# Patient Record
Sex: Male | Born: 1965 | Hispanic: No | Marital: Married | Smoking: Former smoker
Health system: Southern US, Community
[De-identification: ages and names within clinical notes are randomized; demographics above are authoritative.]

## PROBLEM LIST (undated history)

## (undated) DIAGNOSIS — S2239XA Fracture of one rib, unspecified side, initial encounter for closed fracture: Secondary | ICD-10-CM

## (undated) DIAGNOSIS — K3184 Gastroparesis: Secondary | ICD-10-CM

## (undated) DIAGNOSIS — S0300XA Dislocation of jaw, unspecified side, initial encounter: Secondary | ICD-10-CM

## (undated) DIAGNOSIS — E785 Hyperlipidemia, unspecified: Secondary | ICD-10-CM

## (undated) DIAGNOSIS — F17200 Nicotine dependence, unspecified, uncomplicated: Secondary | ICD-10-CM

## (undated) DIAGNOSIS — N319 Neuromuscular dysfunction of bladder, unspecified: Secondary | ICD-10-CM

## (undated) DIAGNOSIS — J309 Allergic rhinitis, unspecified: Secondary | ICD-10-CM

## (undated) DIAGNOSIS — Z9889 Other specified postprocedural states: Secondary | ICD-10-CM

## (undated) DIAGNOSIS — G35 Multiple sclerosis: Secondary | ICD-10-CM

## (undated) DIAGNOSIS — R112 Nausea with vomiting, unspecified: Secondary | ICD-10-CM

## (undated) DIAGNOSIS — R269 Unspecified abnormalities of gait and mobility: Secondary | ICD-10-CM

## (undated) DIAGNOSIS — K219 Gastro-esophageal reflux disease without esophagitis: Secondary | ICD-10-CM

## (undated) DIAGNOSIS — G4700 Insomnia, unspecified: Secondary | ICD-10-CM

## (undated) DIAGNOSIS — G629 Polyneuropathy, unspecified: Secondary | ICD-10-CM

## (undated) DIAGNOSIS — Z86010 Personal history of colonic polyps: Principal | ICD-10-CM

## (undated) DIAGNOSIS — J90 Pleural effusion, not elsewhere classified: Secondary | ICD-10-CM

## (undated) HISTORY — DX: Insomnia, unspecified: G47.00

## (undated) HISTORY — PX: ANKLE SURGERY: SHX546

## (undated) HISTORY — DX: Neuromuscular dysfunction of bladder, unspecified: N31.9

## (undated) HISTORY — DX: Unspecified abnormalities of gait and mobility: R26.9

## (undated) HISTORY — DX: Multiple sclerosis: G35

## (undated) HISTORY — DX: Hyperlipidemia, unspecified: E78.5

## (undated) HISTORY — DX: Personal history of colonic polyps: Z86.010

## (undated) HISTORY — PX: FOOT SURGERY: SHX648

## (undated) HISTORY — DX: Gastro-esophageal reflux disease without esophagitis: K21.9

## (undated) HISTORY — DX: Dislocation of jaw, unspecified side, initial encounter: S03.00XA

## (undated) HISTORY — DX: Allergic rhinitis, unspecified: J30.9

## (undated) HISTORY — DX: Nicotine dependence, unspecified, uncomplicated: F17.200

## (undated) HISTORY — PX: COLONOSCOPY: SHX174

---

## 2003-06-21 ENCOUNTER — Encounter: Admission: RE | Admit: 2003-06-21 | Discharge: 2003-06-21 | Payer: Self-pay | Admitting: Neurology

## 2003-06-21 ENCOUNTER — Encounter: Payer: Self-pay | Admitting: Neurology

## 2003-07-12 ENCOUNTER — Ambulatory Visit (HOSPITAL_COMMUNITY): Admission: RE | Admit: 2003-07-12 | Discharge: 2003-07-12 | Payer: Self-pay | Admitting: Neurology

## 2003-07-12 ENCOUNTER — Encounter: Payer: Self-pay | Admitting: Neurology

## 2003-08-08 ENCOUNTER — Encounter (INDEPENDENT_AMBULATORY_CARE_PROVIDER_SITE_OTHER): Payer: Self-pay | Admitting: *Deleted

## 2003-08-08 ENCOUNTER — Ambulatory Visit (HOSPITAL_COMMUNITY): Admission: RE | Admit: 2003-08-08 | Discharge: 2003-08-08 | Payer: Self-pay | Admitting: Neurology

## 2004-03-06 ENCOUNTER — Ambulatory Visit (HOSPITAL_COMMUNITY): Admission: RE | Admit: 2004-03-06 | Discharge: 2004-03-06 | Payer: Self-pay | Admitting: Neurology

## 2006-05-21 ENCOUNTER — Ambulatory Visit: Payer: Self-pay | Admitting: Psychology

## 2006-05-21 ENCOUNTER — Encounter: Admission: RE | Admit: 2006-05-21 | Discharge: 2006-08-19 | Payer: Self-pay | Admitting: Psychology

## 2006-07-30 ENCOUNTER — Ambulatory Visit: Payer: Self-pay | Admitting: Psychology

## 2006-08-22 ENCOUNTER — Ambulatory Visit: Payer: Self-pay | Admitting: Psychology

## 2006-09-18 ENCOUNTER — Ambulatory Visit: Payer: Self-pay | Admitting: Psychology

## 2006-10-02 ENCOUNTER — Ambulatory Visit: Payer: Self-pay | Admitting: Psychology

## 2006-10-16 ENCOUNTER — Ambulatory Visit: Payer: Self-pay | Admitting: Psychology

## 2006-11-13 ENCOUNTER — Ambulatory Visit: Payer: Self-pay | Admitting: Psychology

## 2006-12-11 ENCOUNTER — Ambulatory Visit: Payer: Self-pay | Admitting: Psychology

## 2008-11-08 ENCOUNTER — Encounter: Admission: RE | Admit: 2008-11-08 | Discharge: 2008-11-08 | Payer: Self-pay | Admitting: Neurology

## 2008-11-08 ENCOUNTER — Ambulatory Visit: Payer: Self-pay | Admitting: Psychology

## 2010-03-27 ENCOUNTER — Ambulatory Visit: Payer: Self-pay | Admitting: Internal Medicine

## 2010-03-27 DIAGNOSIS — J309 Allergic rhinitis, unspecified: Secondary | ICD-10-CM | POA: Insufficient documentation

## 2010-03-27 DIAGNOSIS — K219 Gastro-esophageal reflux disease without esophagitis: Secondary | ICD-10-CM

## 2010-03-27 DIAGNOSIS — E785 Hyperlipidemia, unspecified: Secondary | ICD-10-CM | POA: Insufficient documentation

## 2010-03-27 DIAGNOSIS — G35 Multiple sclerosis: Secondary | ICD-10-CM | POA: Insufficient documentation

## 2010-03-27 HISTORY — DX: Gastro-esophageal reflux disease without esophagitis: K21.9

## 2010-03-27 HISTORY — DX: Allergic rhinitis, unspecified: J30.9

## 2010-03-27 HISTORY — DX: Hyperlipidemia, unspecified: E78.5

## 2010-03-27 HISTORY — DX: Multiple sclerosis: G35

## 2010-03-27 LAB — CONVERTED CEMR LAB
AST: 31 units/L (ref 0–37)
BUN: 20 mg/dL (ref 6–23)
Basophils Absolute: 0 10*3/uL (ref 0.0–0.1)
Calcium: 9.4 mg/dL (ref 8.4–10.5)
Cholesterol: 199 mg/dL (ref 0–200)
Eosinophils Absolute: 0.1 10*3/uL (ref 0.0–0.7)
GFR calc non Af Amer: 63.61 mL/min (ref >60)
Glucose, Bld: 96 mg/dL (ref 70–99)
HCT: 44.7 % (ref 39.0–52.0)
Lymphocytes Relative: 19.4 % (ref 12.0–46.0)
Lymphs Abs: 1.3 10*3/uL (ref 0.7–4.0)
MCHC: 33.4 g/dL (ref 30.0–36.0)
Monocytes Relative: 11 % (ref 3.0–12.0)
Platelets: 167 10*3/uL (ref 150.0–400.0)
RDW: 14.1 % (ref 11.5–14.6)
TSH: 1.14 microintl units/mL (ref 0.35–5.50)
Total Bilirubin: 0.9 mg/dL (ref 0.3–1.2)

## 2010-04-04 ENCOUNTER — Telehealth: Payer: Self-pay | Admitting: Internal Medicine

## 2010-04-25 ENCOUNTER — Encounter: Admission: RE | Admit: 2010-04-25 | Discharge: 2010-04-25 | Payer: Self-pay | Admitting: Neurology

## 2010-05-24 ENCOUNTER — Ambulatory Visit: Payer: Self-pay | Admitting: Internal Medicine

## 2010-05-24 DIAGNOSIS — F17200 Nicotine dependence, unspecified, uncomplicated: Secondary | ICD-10-CM

## 2010-05-24 HISTORY — DX: Nicotine dependence, unspecified, uncomplicated: F17.200

## 2010-07-25 ENCOUNTER — Telehealth: Payer: Self-pay | Admitting: Internal Medicine

## 2010-09-10 ENCOUNTER — Encounter: Payer: Self-pay | Admitting: Internal Medicine

## 2010-11-13 ENCOUNTER — Encounter: Payer: Self-pay | Admitting: Internal Medicine

## 2011-01-08 NOTE — Letter (Signed)
Summary: [facility]  [facility]   Imported By: Maryln Gottron 09/14/2010 14:04:32  _____________________________________________________________________  External Attachment:    Type:   Image     Comment:   External Document

## 2011-01-08 NOTE — Assessment & Plan Note (Signed)
Summary: BRAND NEW PT/TO EST/CJR   Vital Signs:  Patient profile:   45 year old male Height:      70.75 inches (179.71 cm) Weight:      172 pounds (78.18 kg) BMI:     24.25 Temp:     97.3 degrees F (36.28 degrees C) oral BP sitting:   110 / 70  (left arm) Cuff size:   regular  Vitals Entered By: Duard Brady LPN (March 27, 2010 2:49 PM) CC: new pt to establish Is Patient Diabetic? No   CC:  new pt to establish.  History of Present Illness: 45 year old patient who is seen today to establish with our practice.  He has a history of MS and is followed by Dr. love.  His prior physician has retired.  He has a history of gastroesophageal reflux disease, chronic insomnia, peripheral neuropathy, and also a history of ongoing tobacco use.  He has a history of secondary neurogenic bladder as well.  Sounds like she has had some significant weight gain recently related to lyrica, but now is back to baseline since this has been discontinued.  Preventive Screening-Counseling & Management  Alcohol-Tobacco     Smoking Status: current     Smoking Cessation Counseling: yes  Caffeine-Diet-Exercise     Does Patient Exercise: no  Allergies (verified): 1)  ! Morphine 2)  ! Amoxicillin 3)  ! Ampicillin  Past History:  Past Medical History: MS/cognitive impairment GERD Hyperlipidemia  peripheral neuropathy neurogenic bladder tobacco abuse history of TMJ chronic insomnia Allergic rhinitis  Past Surgical History: broken ankle and heel 2009 colonoscopy 2007 Madilyn Fireman)  Family History: Reviewed history and no changes required.  mother is living and in reasonably good health father died age 2.  Lung cancer one brother one sister are well  Social History: Reviewed history and no changes required. Current Smoker Regular exercise-no has worked as an Pensions consultant in the past.  Presently, an Scientist, research (medical) wife  is a podiatristSmoking Status:  current Does Patient Exercise:   no  Review of Systems       The patient complains of weight loss.  The patient denies anorexia, fever, weight gain, vision loss, decreased hearing, hoarseness, chest pain, syncope, dyspnea on exertion, peripheral edema, prolonged cough, headaches, hemoptysis, abdominal pain, melena, hematochezia, severe indigestion/heartburn, hematuria, incontinence, genital sores, muscle weakness, suspicious skin lesions, transient blindness, difficulty walking, depression, unusual weight change, abnormal bleeding, enlarged lymph nodes, angioedema, breast masses, and testicular masses.         recent folliculitis treated with minocycline; has had difficulties with both acetaminophen and ibuprofen as far as liver and renal toxicity, respectively  Physical Exam  General:  Well-developed,well-nourished,in no acute distress; alert,appropriate and cooperative throughout examination; low-pressure low normal range Head:  Normocephalic and atraumatic without obvious abnormalities. No apparent alopecia or balding. Eyes:  No corneal or conjunctival inflammation noted. EOMI. Perrla. Funduscopic exam benign, without hemorrhages, exudates or papilledema. Vision grossly normal.  suggestion of slight ptosis Ears:  External ear exam shows no significant lesions or deformities.  Otoscopic examination reveals clear canals, tympanic membranes are intact bilaterally without bulging, retraction, inflammation or discharge. Hearing is grossly normal bilaterally. Nose:  External nasal examination shows no deformity or inflammation. Nasal mucosa are pink and moist without lesions or exudates. Mouth:  Oral mucosa and oropharynx without lesions or exudates.  Teeth in good repair. Neck:  No deformities, masses, or tenderness noted. Chest Wall:  No deformities, masses, tenderness or gynecomastia noted. Breasts:  No masses or  gynecomastia noted Lungs:  Normal respiratory effort, chest expands symmetrically. Lungs are clear to auscultation, no  crackles or wheezes. Heart:  Normal rate and regular rhythm. S1 and S2 normal without gallop, murmur, click, rub or other extra sounds. Abdomen:  Bowel sounds positive,abdomen soft and non-tender without masses, organomegaly or hernias noted. Genitalia:  Testes bilaterally descended without nodularity, tenderness or masses. No scrotal masses or lesions. No penis lesions or urethral discharge. Msk:  No deformity or scoliosis noted of thoracic or lumbar spine.   Pulses:  R and L carotid,radial,femoral,dorsalis pedis and posterior tibial pulses are full and equal bilaterally Extremities:  No clubbing, cyanosis, edema, or deformity noted with normal full range of motion of all joints.   Neurologic:  alert & oriented X3, cranial nerves II-XII intact, strength normal in all extremities, sensation intact to light touch, sensation intact to pinprick, and gait normal.  heel-to-shin performed fairly normally, but impaired finger-to-nose, right greater than left Skin:  Intact without suspicious lesions or rashes Cervical Nodes:  No lymphadenopathy noted Axillary Nodes:  No palpable lymphadenopathy Inguinal Nodes:  No significant adenopathy Psych:  Cognition and judgment appear intact. Alert and cooperative with normal attention span and concentration. No apparent delusions, illusions, hallucinations   Impression & Recommendations:  Problem # 1:  MULTIPLE SCLEROSIS, RELAPSING/REMITTING (ICD-340)  Orders: Venipuncture (16109) TLB-BMP (Basic Metabolic Panel-BMET) (80048-METABOL) TLB-CBC Platelet - w/Differential (85025-CBCD) TLB-Hepatic/Liver Function Pnl (80076-HEPATIC)  Problem # 2:  GERD (ICD-530.81)  His updated medication list for this problem includes:    Prilosec 20 Mg Cpdr (Omeprazole) .Marland Kitchen... As needed  Orders: Venipuncture (60454) TLB-BMP (Basic Metabolic Panel-BMET) (80048-METABOL) TLB-CBC Platelet - w/Differential (85025-CBCD) TLB-Hepatic/Liver Function Pnl (80076-HEPATIC)  Problem #  3:  HYPERLIPIDEMIA (ICD-272.4)  Orders: Venipuncture (09811) TLB-BMP (Basic Metabolic Panel-BMET) (80048-METABOL) TLB-CBC Platelet - w/Differential (85025-CBCD) TLB-Hepatic/Liver Function Pnl (80076-HEPATIC) TLB-TSH (Thyroid Stimulating Hormone) (84443-TSH) TLB-Cholesterol, Total (82465-CHO)  Complete Medication List: 1)  Rebif 44 Mcg/0.69ml Soln (Interferon beta-1a) .... 3xweekly 2)  Aricept 10 Mg Tabs (Donepezil hcl) .... Once daily 3)  Ibuprofen 800 Mg Tabs (Ibuprofen) .... 3xweekly 4)  Lunesta 3 Mg Tabs (Eszopiclone) .... Prn 5)  Trazodone Hcl 50 Mg Tabs (Trazodone hcl) .... S-t-th-f 6)  Tylenol Pm Extra Strength 500-25 Mg Tabs (Diphenhydramine-apap (sleep)) .... Sun-t-thur-fri 7)  Ativan 1 Mg Tabs (Lorazepam) .... Once daily 8)  Gabapentin 600 Mg Tabs (Gabapentin) .... Qid 9)  Metanx 3-35-2 Mg Tabs (L-methylfolate-b6-b12) .... Two times a day 10)  Propecia 1 Mg Tabs (Finasteride) .... Once daily 11)  Gnc Calcium  .... Once daily 12)  Gnc Megaman  .... Once daily 13)  Total Memory & Focus Formula Tabs (Misc natural products) .... Once daily 14)  Tylenol Arthritis Pain 650 Mg Cr-tabs (Acetaminophen) .... Once daily 15)  Prilosec 20 Mg Cpdr (Omeprazole) .... As needed 16)  Nicorette 2 Mg Gum (Nicotine polacrilex) .... As needed  Patient Instructions: 1)  Please schedule a follow-up appointment in 6 months. 2)  It is important that you exercise regularly at least 20 minutes 5 times a week. If you develop chest pain, have severe difficulty breathing, or feel very tired , stop exercising immediately and seek medical attention. 3)  Tobacco is very bad for your health and your loved ones! You Should stop smoking!. Prescriptions: PRILOSEC 20 MG CPDR (OMEPRAZOLE) as needed  #90 x 6   Entered and Authorized by:   Gordy Savers  MD   Signed by:   Gordy Savers  MD on 03/27/2010  Method used:   Print then Give to Patient   RxID:   [id number] PROPECIA 1 MG TABS  (FINASTERIDE) once daily  #90 x 6   Entered and Authorized by:   Gordy Savers  MD   Signed by:   Gordy Savers  MD on 03/27/2010   Method used:   Print then Give to Patient   RxID:   [id number] QIONGE 3-35-2 MG TABS (L-METHYLFOLATE-B6-B12) two times a day  #90 x 6   Entered and Authorized by:   Gordy Savers  MD   Signed by:   Gordy Savers  MD on 03/27/2010   Method used:   Print then Give to Patient   RxID:   [id number] ATIVAN 1 MG TABS (LORAZEPAM) once daily  #90 x 4   Entered and Authorized by:   Gordy Savers  MD   Signed by:   Gordy Savers  MD on 03/27/2010   Method used:   Print then Give to Patient   RxID:   [id number] TRAZODONE HCL 50 MG TABS (TRAZODONE HCL) s-t-th-f  #180 x 6   Entered and Authorized by:   Gordy Savers  MD   Signed by:   Gordy Savers  MD on 03/27/2010   Method used:   Print then Give to Patient   RxID:   [id number] LUNESTA 3 MG TABS (ESZOPICLONE) prn  #50 x 4   Entered and Authorized by:   Gordy Savers  MD   Signed by:   Gordy Savers  MD on 03/27/2010   Method used:   Print then Give to Patient   RxID:   [id number] ARICEPT 10 MG TABS (DONEPEZIL HCL) once daily  #90 x 6   Entered and Authorized by:   Gordy Savers  MD   Signed by:   Gordy Savers  MD on 03/27/2010   Method used:   Print then Give to Patient   RxID:   [id number]

## 2011-01-08 NOTE — Progress Notes (Signed)
Summary: refill bupropion denied  Phone Note Call from Patient   Caller: Patient Reason for Call: Refill Medication Summary of Call: needs refill on bupropion 150mg  to [facility]   [phone] Initial call taken by: Duard Brady LPN,  July 25, 2010 12:26 PM  Follow-up for Phone Call        we have never fill for this pt - dr. Amador Cunas to advise Follow-up by: Duard Brady LPN,  July 25, 2010 12:28 PM  Additional Follow-up for Phone Call Additional follow up Details #1::        what is this medication for.  is the medication for smoking cessation? Additional Follow-up by: Gordy Savers  MD,  July 25, 2010 12:46 PM    Additional Follow-up for Phone Call Additional follow up Details #2::    spoke with pt - states he is using both chantix and bupropion for smoking cessation.  Follow-up by: Duard Brady LPN,  July 25, 2010 12:51 PM  Additional Follow-up for Phone Call Additional follow up Details #3:: Details for Additional Follow-up Action Taken: do  not recommend both medications simultaneously  8/18 - called pt - he states he has been taking to combo for sometime and it is working. i explained that he may need to come in to discuss, since it has not been rx'd by this office, but I would let dr. Amador Cunas know. Additional Follow-up by: Gordy Savers  MD,  July 26, 2010 7:34 AM  do not recommend taking both meds - PK  attempt to call pt - ans mach - LMTCB and make appt. As we had discussed yesterday - Dr. Amador Cunas does not reccomend both meds to be take at the same time - will need to see . KIK

## 2011-01-08 NOTE — Assessment & Plan Note (Signed)
Summary: SMOKING CESSATION CONSULT // RS   Vital Signs:  Patient profile:   45 year old male Weight:      171 pounds Temp:     98.1 degrees F oral BP sitting:   120 / 70  (right arm) Cuff size:   regular  Vitals Entered By: Duard Brady LPN (May 24, 2010 1:08 PM) CC: wants to stop smoking Is Patient Diabetic? No   CC:  wants to stop smoking.  History of Present Illness: 45 year old patient who is seen today for counseling for smoking cessation.  Apparently, he is already taking Wellbutrin 150 b.i.d., and this is added to his medicine list today.  He has been on Chantix  in the past which apparently was helpful, but later relapsed.  He is followed closely by neurology for MS.  Preventive Screening-Counseling & Management  Alcohol-Tobacco     Smoking Status: never  Allergies: 1)  ! Morphine 2)  ! Amoxicillin 3)  ! Ampicillin  Social History: Smoking Status:  never  Physical Exam  General:  Well-developed,well-nourished,in no acute distress; alert,appropriate and cooperative throughout examination Head:  Normocephalic and atraumatic without obvious abnormalities. No apparent alopecia or balding. Mouth:  Oral mucosa and oropharynx without lesions or exudates.  Teeth in good repair. Neck:  No deformities, masses, or tenderness noted. Lungs:  Normal respiratory effort, chest expands symmetrically. Lungs are clear to auscultation, no crackles or wheezes. Heart:  Normal rate and regular rhythm. S1 and S2 normal without gallop, murmur, click, rub or other extra sounds. Abdomen:  Bowel sounds positive,abdomen soft and non-tender without masses, organomegaly or hernias noted.   Impression & Recommendations:  Problem # 1:  MULTIPLE SCLEROSIS, RELAPSING/REMITTING (ICD-340)  Problem # 2:  NICOTINE ADDICTION (ICD-305.1)  His updated medication list for this problem includes:    Nicorette 2 Mg Gum (Nicotine polacrilex) .Marland Kitchen... As needed    Chantix Starting Month Pak 0.5 Mg X  11 & 1 Mg X 42 Tabs (Varenicline tartrate) .Marland Kitchen... As directed    Chantix 1 Mg Tabs (Varenicline tartrate) ..... One twice daily  His updated medication list for this problem includes:    Nicorette 2 Mg Gum (Nicotine polacrilex) .Marland Kitchen... As needed    Chantix Starting Month Pak 0.5 Mg X 11 & 1 Mg X 42 Tabs (Varenicline tartrate) .Marland Kitchen... As directed    Chantix 1 Mg Tabs (Varenicline tartrate) ..... One twice daily  Complete Medication List: 1)  Rebif 44 Mcg/0.35ml Soln (Interferon beta-1a) .... 3xweekly 2)  Aricept 10 Mg Tabs (Donepezil hcl) .... Once daily 3)  Ibuprofen 800 Mg Tabs (Ibuprofen) .... 3xweekly 4)  Lunesta 3 Mg Tabs (Eszopiclone) .... Prn 5)  Trazodone Hcl 50 Mg Tabs (Trazodone hcl) .... One every hs 6)  Tylenol Pm Extra Strength 500-25 Mg Tabs (Diphenhydramine-apap (sleep)) .... Sun-t-thur-fri 7)  Ativan 1 Mg Tabs (Lorazepam) .... Once daily 8)  Gabapentin 600 Mg Tabs (Gabapentin) .... Qid 9)  Metanx 3-35-2 Mg Tabs (L-methylfolate-b6-b12) .... Two times a day 10)  Propecia 1 Mg Tabs (Finasteride) .... Once daily 11)  Gnc Calcium  .... Once daily 12)  Gnc Megaman  .... Once daily 13)  Total Memory & Focus Formula Tabs (Misc natural products) .... Once daily 14)  Tylenol Arthritis Pain 650 Mg Cr-tabs (Acetaminophen) .... Once daily 15)  Prilosec 20 Mg Cpdr (Omeprazole) .... As needed 16)  Nicorette 2 Mg Gum (Nicotine polacrilex) .... As needed 17)  Chantix Starting Month Pak 0.5 Mg X 11 & 1 Mg X 42  Tabs (Varenicline tartrate) .... As directed 18)  Chantix 1 Mg Tabs (Varenicline tartrate) .... One twice daily 19)  Bupropion Hcl 150 Mg Xr12h-tab (Bupropion hcl) .... One twice daily  Patient Instructions: 1)  Please schedule a follow-up appointment in 3 months. 2)  It is important that you exercise regularly at least 20 minutes 5 times a week. If you develop chest pain, have severe difficulty breathing, or feel very tired , stop exercising immediately and seek medical  attention. Prescriptions: CHANTIX 1 MG TABS (VARENICLINE TARTRATE) one twice daily  #60 x 3   Entered and Authorized by:   Gordy Savers  MD   Signed by:   Gordy Savers  MD on 05/24/2010   Method used:   Print then Give to Patient   RxID:   [id number] CHANTIX 1 MG TABS (VARENICLINE TARTRATE) one twice daily  #60 x 3   Entered and Authorized by:   Gordy Savers  MD   Signed by:   Gordy Savers  MD on 05/24/2010   Method used:   Electronically to        [facility]       [address]       [place], [place]  [address]       Ph: [phone]       Fax: [phone]   RxID:   [id number] CHANTIX STARTING MONTH PAK 0.5 MG X 11 & 1 MG X 42 TABS (VARENICLINE TARTRATE) as directed  #one month x 0   Entered and Authorized by:   Gordy Savers  MD   Signed by:   Gordy Savers  MD on 05/24/2010   Method used:   Print then Give to Patient   RxID:   [id number]

## 2011-01-08 NOTE — Progress Notes (Signed)
Summary: Medication List provided by patient  Medication List provided by patient   Imported By: Maryln Gottron 04/02/2010 12:16:10  _____________________________________________________________________  External Attachment:    Type:   Image     Comment:   External Document

## 2011-01-08 NOTE — Progress Notes (Signed)
Summary: trazadone   Phone Note From Pharmacy   Caller: [facility] Summary of Call: ref#[phone] Trazadone 50mg  dated 03-27-10.  Directions are unclear.  [phone] option2 Initial call taken by: Rudy Jew, RN,  April 04, 2010 2:56 PM  Follow-up for Phone Call        change  Rx to read 50 mg every HS Follow-up by: Gordy Savers  MD,  April 05, 2010 8:58 AM  Additional Follow-up for Phone Call Additional follow up Details #1::        Phone call completed Additional Follow-up by: Rudy Jew, RN,  April 05, 2010 10:18 AM    New/Updated Medications: TRAZODONE HCL 50 MG TABS (TRAZODONE HCL) One every hs Prescriptions: TRAZODONE HCL 50 MG TABS (TRAZODONE HCL) One every hs  #90 x 6   Entered by:   Rudy Jew, RN   Authorized by:   Gordy Savers  MD   Signed by:   Rudy Jew, RN on 04/05/2010   Method used:   Historical   RxID:   [id number]

## 2011-01-10 NOTE — Letter (Signed)
Summary: [facility]  [facility]   Imported By: Maryln Gottron 12/11/2010 08:46:03  _____________________________________________________________________  External Attachment:    Type:   Image     Comment:   External Document

## 2011-02-15 ENCOUNTER — Ambulatory Visit (INDEPENDENT_AMBULATORY_CARE_PROVIDER_SITE_OTHER): Payer: BC Managed Care – PPO | Admitting: Internal Medicine

## 2011-02-15 ENCOUNTER — Encounter: Payer: Self-pay | Admitting: Internal Medicine

## 2011-02-15 VITALS — BP 110/80 | Temp 98.6°F | Wt 181.0 lb

## 2011-02-15 DIAGNOSIS — J309 Allergic rhinitis, unspecified: Secondary | ICD-10-CM

## 2011-02-15 DIAGNOSIS — K219 Gastro-esophageal reflux disease without esophagitis: Secondary | ICD-10-CM

## 2011-02-15 DIAGNOSIS — F17200 Nicotine dependence, unspecified, uncomplicated: Secondary | ICD-10-CM

## 2011-02-15 DIAGNOSIS — Z7189 Other specified counseling: Secondary | ICD-10-CM

## 2011-02-15 DIAGNOSIS — Z716 Tobacco abuse counseling: Secondary | ICD-10-CM

## 2011-02-15 MED ORDER — BUPROPION HCL ER (XL) 150 MG PO TB24: 150.0000 mg | ORAL_TABLET | Freq: Two times a day (BID) | ORAL | Status: DC

## 2011-02-15 MED ORDER — VARENICLINE TARTRATE 0.5 MG X 11 & 1 MG X 42 PO MISC: ORAL | Status: DC

## 2011-02-15 MED ORDER — VARENICLINE TARTRATE 1 MG PO TABS: 1.0000 mg | ORAL_TABLET | Freq: Two times a day (BID) | ORAL | Status: DC

## 2011-02-15 NOTE — Progress Notes (Signed)
  Subjective:    Patient ID: [name], male    DOB: [birth year], 45 y.o.   MRN: [id number]  HPI  45 year old patient who is seen today requesting assistance with smoking cessation. He is less than one half pack per day. He has successfully discontinued a number of times since his young adulthood with periods of abstinence from 3-9 months.  Approximately one year ago he successfully discontinued with Chantix  And he is requesting a medicine refill. He does have a history of MS with some short-term memory loss. Otherwise he is doing well. He does have a history of allergic rhinitis as well as gastroesophageal reflux disease which have been stable.   Review of Systems  Constitutional: Negative for fever, chills, appetite change and fatigue.  HENT: Negative for hearing loss, ear pain, congestion, sore throat, trouble swallowing, neck stiffness, dental problem, voice change and tinnitus.   Eyes: Negative for pain, discharge and visual disturbance.  Respiratory: Negative for cough, chest tightness, wheezing and stridor.   Cardiovascular: Negative for chest pain, palpitations and leg swelling.  Gastrointestinal: Negative for nausea, vomiting, abdominal pain, diarrhea, constipation, blood in stool and abdominal distention.  Genitourinary: Negative for urgency, hematuria, flank pain, discharge, difficulty urinating and genital sores.  Musculoskeletal: Negative for myalgias, back pain, joint swelling, arthralgias and gait problem.  Skin: Negative for rash.  Neurological: Negative for dizziness, syncope, speech difficulty, weakness, numbness and headaches.  Hematological: Negative for adenopathy. Does not bruise/bleed easily.  Psychiatric/Behavioral: Negative for behavioral problems and dysphoric mood. The patient is not nervous/anxious.        Objective:   Physical Exam  Constitutional: He appears well-developed and well-nourished. No distress.  Psychiatric: He has a normal mood and affect.           Assessment & Plan:   tobacco abuse. Prescription for Chantix was refilled. Smoking cessation discussed. He will attempt to avoid high-risk situations in the future such as drinking in cocktail lounges which have precipitated her relapses in the past

## 2011-02-15 NOTE — Patient Instructions (Signed)
Smoking tobacco is very bad for your health. You should stop smoking immediately. 

## 2011-03-25 ENCOUNTER — Other Ambulatory Visit: Payer: Self-pay | Admitting: Internal Medicine

## 2011-03-25 NOTE — Telephone Encounter (Signed)
Pt needs new rxs #90 with 3 refills lunesta 3mg ,lorazepam 1mg ,gabapentin 300mg , metanx and  trazodone 50 mg. Please call [facility] [phone]

## 2011-03-27 MED ORDER — LORAZEPAM 1 MG PO TABS: 1.0000 mg | ORAL_TABLET | Freq: Every day | ORAL | Status: DC

## 2011-03-27 MED ORDER — GABAPENTIN 600 MG PO TABS: 600.0000 mg | ORAL_TABLET | Freq: Four times a day (QID) | ORAL | Status: DC

## 2011-03-27 MED ORDER — METANX 3-35-2 MG PO TABS: 1.0000 | ORAL_TABLET | Freq: Two times a day (BID) | ORAL | Status: DC

## 2011-03-27 MED ORDER — TRAZODONE HCL 50 MG PO TABS: 50.0000 mg | ORAL_TABLET | Freq: Every day | ORAL | Status: DC

## 2011-03-27 NOTE — Telephone Encounter (Signed)
Spoke with pt - asked about lunesta - i dont see on med list -?must of gotten it from another doctor. Will fill other meds - but only for 90 with 1 refill - 2 control substance. KIK

## 2011-04-25 ENCOUNTER — Other Ambulatory Visit: Payer: Self-pay | Admitting: Neurology

## 2011-04-25 DIAGNOSIS — G35 Multiple sclerosis: Secondary | ICD-10-CM

## 2011-04-26 NOTE — Op Note (Signed)
   NAME:  [name], [name]                          ACCOUNT NO.:  [id number]   MEDICAL RECORD NO.:  [id number]                   PATIENT TYPE:  OUT   LOCATION:  [facility]                                  FACILITY:  [facility]   PHYSICIAN:  Genene Churn. Love, M.D.                 DATE OF BIRTH:  Nov 19, 1966   DATE OF PROCEDURE:  08/08/2003  DATE OF DISCHARGE:                                 OPERATIVE REPORT   This patient was prepped and draped in the left lateral decubitus position  using Betadine and 1% Xylocaine.  The L3-L4 interspace was entered without  difficulty.  The opening pressure was 110 mm of water and clear color CSF  was obtained and sent for study including cell count, differential, IV  protein, glucose, IgG and oligoclonal IgG. The patient tolerated the  procedure well.                                               Genene Churn. Sandria Manly, M.D.    JML/MEDQ  D:  08/08/2003  T:  08/08/2003  Job:  [id number]

## 2011-05-02 ENCOUNTER — Ambulatory Visit
Admission: RE | Admit: 2011-05-02 | Discharge: 2011-05-02 | Disposition: A | Discharge status: Home / Self Care | Payer: BC Managed Care – PPO | Source: Ambulatory Visit | Attending: Neurology | Admitting: Neurology

## 2011-05-02 DIAGNOSIS — G35 Multiple sclerosis: Secondary | ICD-10-CM

## 2011-05-02 MED ORDER — GADOBENATE DIMEGLUMINE 529 MG/ML IV SOLN
16.0000 mL | Freq: Once | INTRAVENOUS | Status: AC | PRN
  Administered 2011-05-02: 16 mL via INTRAVENOUS

## 2011-05-20 ENCOUNTER — Telehealth: Payer: Self-pay

## 2011-05-20 MED ORDER — TRAZODONE HCL 50 MG PO TABS: 50.0000 mg | ORAL_TABLET | Freq: Every day | ORAL | Status: DC

## 2011-05-20 NOTE — Telephone Encounter (Signed)
Faxed back to [facility] - [phone]   Ph [phone]

## 2011-06-17 ENCOUNTER — Other Ambulatory Visit: Payer: Self-pay | Admitting: Internal Medicine

## 2011-08-30 ENCOUNTER — Other Ambulatory Visit: Payer: Self-pay

## 2011-08-30 MED ORDER — VARENICLINE TARTRATE 1 MG PO TABS: 1.0000 mg | ORAL_TABLET | Freq: Two times a day (BID) | ORAL | Status: DC

## 2011-08-30 NOTE — Telephone Encounter (Signed)
Faxed back to [facility] [phone] ph [phone]

## 2011-09-03 ENCOUNTER — Other Ambulatory Visit: Payer: Self-pay

## 2011-09-03 MED ORDER — TRAZODONE HCL 50 MG PO TABS: 50.0000 mg | ORAL_TABLET | Freq: Every day | ORAL | Status: DC

## 2011-09-03 NOTE — Telephone Encounter (Signed)
Faxed refill tradaone back to [facility]

## 2011-10-01 ENCOUNTER — Other Ambulatory Visit: Payer: Self-pay

## 2011-10-01 MED ORDER — BUPROPION HCL ER (XL) 150 MG PO TB24: 150.0000 mg | ORAL_TABLET | Freq: Two times a day (BID) | ORAL | Status: DC

## 2011-10-01 NOTE — Telephone Encounter (Signed)
Faxed back to [facility]

## 2012-03-15 ENCOUNTER — Other Ambulatory Visit: Payer: Self-pay | Admitting: Internal Medicine

## 2012-04-01 ENCOUNTER — Encounter: Payer: Self-pay | Admitting: Internal Medicine

## 2012-04-02 ENCOUNTER — Encounter: Payer: Self-pay | Admitting: Internal Medicine

## 2012-04-02 ENCOUNTER — Ambulatory Visit (INDEPENDENT_AMBULATORY_CARE_PROVIDER_SITE_OTHER): Payer: BC Managed Care – PPO | Admitting: Internal Medicine

## 2012-04-02 VITALS — BP 120/76 | Temp 98.0°F | Wt 194.0 lb

## 2012-04-02 DIAGNOSIS — G35 Multiple sclerosis: Secondary | ICD-10-CM

## 2012-04-02 MED ORDER — FINASTERIDE 1 MG PO TABS: 1.0000 mg | ORAL_TABLET | Freq: Every day | ORAL | Status: DC

## 2012-04-02 NOTE — Progress Notes (Signed)
  Subjective:    Patient ID: [name], male    DOB: [birth year], 46 y.o.   MRN: [id number]  HPI  46 year old patient who is seen today for followup. He is followed by neurology by annually for MS. He has a history of neurogenic bladder and mild memory impairment. He states that he has been stable. No new concerns or complaints. He states the only medication this office fills is Propecia    Review of Systems  Constitutional: Negative for fever, chills, appetite change and fatigue.  HENT: Negative for hearing loss, ear pain, congestion, sore throat, trouble swallowing, neck stiffness, dental problem, voice change and tinnitus.   Eyes: Negative for pain, discharge and visual disturbance.  Respiratory: Negative for cough, chest tightness, wheezing and stridor.   Cardiovascular: Negative for chest pain, palpitations and leg swelling.  Gastrointestinal: Negative for nausea, vomiting, abdominal pain, diarrhea, constipation, blood in stool and abdominal distention.  Genitourinary: Negative for urgency, hematuria, flank pain, discharge, difficulty urinating and genital sores.  Musculoskeletal: Negative for myalgias, back pain, joint swelling, arthralgias and gait problem.  Skin: Negative for rash.  Neurological: Negative for dizziness, syncope, speech difficulty, weakness, numbness and headaches.  Hematological: Negative for adenopathy. Does not bruise/bleed easily.  Psychiatric/Behavioral: Negative for behavioral problems and dysphoric mood. The patient is not nervous/anxious.        Objective:   Physical Exam  Constitutional: He is oriented to person, place, and time. He appears well-developed.  HENT:  Head: Normocephalic.  Right Ear: External ear normal.  Left Ear: External ear normal.  Eyes: Conjunctivae and EOM are normal.  Neck: Normal range of motion.  Cardiovascular: Normal rate and normal heart sounds.   Pulmonary/Chest: Breath sounds normal.  Abdominal: Bowel sounds are normal.   Musculoskeletal: Normal range of motion. He exhibits no edema and no tenderness.  Neurological: He is alert and oriented to person, place, and time.  Psychiatric: He has a normal mood and affect. His behavior is normal.          Assessment & Plan:   MS stable History mild dyslipidemia. We'll schedule a CPX with updated lab Gastroesophageal reflux disease

## 2012-04-02 NOTE — Patient Instructions (Signed)
HPV Test The HPV (Human papillomavirus) test isused to screen for high-risk types with HPV infection. HPV is a group of about 100 related viruses, of which 40 types are genital viruses. Most HPV viruses cause infections that usually resolve without treatment within 2 years. Some HPV infections can cause skin and genital warts (condylomata). HPV types 16, 18, 31 and 45 are considered high-risk types of HPV. High-risk types of HPV do not usually cause visible warts, but if untreated, may lead to cancers of the outlet of the womb (cervix) or anus. An HPV test identifies the DNA (genetic) strands of the HPV infection. Because the test identifies the DNA strands, the test is also referred to as the HPV DNA test. Although HPV is found in both males and females, the HPV test is only used to screen for cervical cancer in females. This test is recommended for females:  With an abnormal Pap test.   After treatment of an abnormal Pap test.   Aged 28 and older.   After treatment of a high-risk HPV infection.  The HPV test may be done at the same time as a Pap test in females over the age of 74. Both the HPV and Pap test require a sample of cells from the cervix. PREPARATION FOR TEST   You may be asked to avoid douching, tampons or birth canal (vaginal) medicines for 48 hours before the HPV test. You will be asked to urinate before the test. For the HPV test, you will need to lie on an exam table with your feet in stirrups. A spatula will be inserted into the vagina. The spatula will be used to swab the cervix for a cell and mucus sample. The sample will be further evaluated in a lab under a microscope. NORMAL FINDINGS   Normal: High-risk HPV is not found.   Ranges for normal findings may vary among different laboratories and hospitals. You should always check with your doctor after having lab work or other tests done to discuss the meaning of your test results and whether your values are considered within  normal limits. MEANING OF TEST An abnormal HPV test means that high-risk HPV is found. Your caregiver may recommend further testing. Your caregiver will go over the test results with you and discuss the importance and meaning of your results, as well as treatment options and the need for additional tests, if necessary. OBTAINING THE RESULTS   It is your responsibility to obtain your test results. Ask the lab or department performing the test when and how you will get your results. Document Released: 12/20/2004 Document Revised: 11/14/2011 Document Reviewed: 09/04/2005 [facility] Patient Information 2012 [place], [place].Human Papillomavirus Human papillomavirus (HPV) is the most common sexually transmitted infection (STI) and is highly contagious. HPV infections cause genital warts and cancers to the outlet of the womb (cervix), birth canal (vagina), opening of the birth canal (vulva), and anus. There are over 100 types of HPV. Four types of HPV are responsible for causing 70% of all cervical cancers. Ninety percent of anal cancers and genital warts are caused by HPV. Unless you have wart-like lesions in the throat or genital warts that you can see or feel, HPV usually does not cause symptoms. Therefore, people can be infected for long periods and pass it on to others without knowing it. HPV in pregnancy usually does not cause a problem for the mother or baby. If the mother has genital warts, the baby rarely gets infected. When the HPV infection is  found to be pre-cancerous on the cervix, vagina, or vulva, the mother will be followed closely during the pregnancy. Any needed treatment will be done after the baby is born. CAUSES    Having unprotected sex. HPV can be spread by oral, vaginal, or anal sexual activity.   Having several sex partners.   Having a sex partner who has other sex partners.   Having or having had another sexually transmitted infection.  SYMPTOMS    More than 90% of people  carrying HPV cannot tell anything is wrong.   Wart-like lesions in the throat (from having oral sex).   Warts in the infected skin or mucous membranes.   Genital warts may itch, burn, or bleed.   Genital warts may be painful or bleed during sexual intercourse.  DIAGNOSIS    Genital warts are easily seen with the naked eye.   Currently, there is no FDA-approved test to detect HPV in males.   In females, a Pap test can show cells which are infected with HPV.   In females, a scope can be used to view the cervix (colposcopy). A colposcopy can be performed if the pelvic exam or Pap test is abnormal.   In females, a sample of tissue may be removed (biopsy) during the colposcopy.  TREATMENT    Treatment of genital warts can include:   Podophyllin lotion or gel.   Bichloroacetic acid (BCA) or trichloroacetic acid (TCA).   Podofilox solution or gel.   Imiquimod cream.   Interferon injections.   Use of a probe to apply extreme cold (cryotherapy).   Application of an intense beam of light (laser treatment).   Use of a probe to apply extreme heat (electrocautery).   Surgery.   HPV of the cervix, vagina, or vulva can be treated with:   Cryotherapy.   Laser treatment.   Electrocautery.   Surgery.  Your caregiver will follow you closely after you are treated. This is because the HPV can come back and may need treatment again. HOME CARE INSTRUCTIONS    Follow your caregiver's instructions regarding medications, Pap tests, and follow-up exams.   Do not touch or scratch the warts.   Do not treat genital warts with medication used for treating hand warts.   Tell your sex partner about your infection because he or she may also need treatment.   Do not have sex while you are being treated.   After treatment, use condoms during sex to prevent future infections.   Have only 1 sex partner.   Have a sex partner who does not have other sex partners.   Use over-the-counter  creams for itching or irritation as directed by your caregiver.   Use over-the-counter or prescription medicines for pain, discomfort, or fever as directed by your caregiver.   Do not douche or use tampons during treatment of HPV.  PREVENTION    Talk to your caregiver about getting the HPV vaccines. These vaccines prevent some HPV infections and cancers. It is recommended that the vaccine be given to males and females between the age of 66 and 65 years old. It will not work if you already have HPV and it is not recommended for pregnant women. The vaccines are not recommended for pregnant women.   Call your caregiver if you think you are pregnant and have the HPV.   A PAP test is done to screen for cervical cancer.   The first PAP test should be done at age 39.   Between  ages 13 and 53, PAP tests are repeated every 2 years.   Beginning at age 50, you are advised to have a PAP test every 3 years as long as your past 3 PAP tests have been normal.   Some women have medical problems that increase the chance of getting cervical cancer. Talk to your caregiver about these problems. It is especially important to talk to your caregiver if a new problem develops soon after your last PAP test. In these cases, your caregiver may recommend more frequent screening and Pap tests.   The above recommendations are the same for women who have or have not gotten the vaccine for HPV (Human Papillomavirus).   If you had a hysterectomy for a problem that was not a cancer or a condition that could lead to cancer, then you no longer need Pap tests. However, even if you no longer need a PAP test, a regular exam is a good idea to make sure no other problems are starting.      If you are between ages 22 and 36, and you have had normal Pap tests going back 10 years, you no longer need Pap tests. However, even if you no longer need a PAP test, a regular exam is a good idea to make sure no other problems are starting.    If you have had past treatment for cervical cancer or a condition that could lead to cancer, you need Pap tests and screening for cancer for at least 20 years after your treatment.    If Pap tests have been discontinued, risk factors (such as a new sexual partner) need to be re-assessed to determine if screening should be resumed.   Some women may need screenings more often if they are at high risk for cervical cancer.  SEEK MEDICAL CARE IF:    The treated skin becomes red, swollen or painful.   You have an oral temperature above 102 F (38.9 C).   You feel generally ill.   You feel lumps or pimple-like projections in and around your genital area.   You develop bleeding of the vagina or the treatment area.   You develop painful sexual intercourse.  Document Released: 02/15/2004 Document Revised: 11/14/2011 Document Reviewed: 02/04/2008 [facility] Patient Information 2012 [place], [place].

## 2012-06-12 DIAGNOSIS — F09 Unspecified mental disorder due to known physiological condition: Secondary | ICD-10-CM

## 2012-06-12 DIAGNOSIS — G35 Multiple sclerosis: Secondary | ICD-10-CM

## 2012-06-17 DIAGNOSIS — F09 Unspecified mental disorder due to known physiological condition: Secondary | ICD-10-CM

## 2012-06-17 DIAGNOSIS — G35 Multiple sclerosis: Secondary | ICD-10-CM

## 2012-11-08 DIAGNOSIS — S2239XA Fracture of one rib, unspecified side, initial encounter for closed fracture: Secondary | ICD-10-CM

## 2012-11-08 HISTORY — DX: Fracture of one rib, unspecified side, initial encounter for closed fracture: S22.39XA

## 2012-11-21 ENCOUNTER — Emergency Department (HOSPITAL_COMMUNITY)
Admission: EM | Admit: 2012-11-21 | Discharge: 2012-11-21 | Disposition: A | Discharge status: Home / Self Care | Payer: BC Managed Care – PPO | Attending: Emergency Medicine | Admitting: Emergency Medicine

## 2012-11-21 ENCOUNTER — Encounter (HOSPITAL_COMMUNITY): Payer: Self-pay | Admitting: Family Medicine

## 2012-11-21 ENCOUNTER — Emergency Department (HOSPITAL_COMMUNITY): Payer: BC Managed Care – PPO

## 2012-11-21 DIAGNOSIS — Z87448 Personal history of other diseases of urinary system: Secondary | ICD-10-CM | POA: Insufficient documentation

## 2012-11-21 DIAGNOSIS — IMO0002 Reserved for concepts with insufficient information to code with codable children: Secondary | ICD-10-CM | POA: Insufficient documentation

## 2012-11-21 DIAGNOSIS — Z8709 Personal history of other diseases of the respiratory system: Secondary | ICD-10-CM | POA: Insufficient documentation

## 2012-11-21 DIAGNOSIS — S46909A Unspecified injury of unspecified muscle, fascia and tendon at shoulder and upper arm level, unspecified arm, initial encounter: Secondary | ICD-10-CM | POA: Insufficient documentation

## 2012-11-21 DIAGNOSIS — W19XXXA Unspecified fall, initial encounter: Secondary | ICD-10-CM

## 2012-11-21 DIAGNOSIS — W108XXA Fall (on) (from) other stairs and steps, initial encounter: Secondary | ICD-10-CM | POA: Insufficient documentation

## 2012-11-21 DIAGNOSIS — S20229A Contusion of unspecified back wall of thorax, initial encounter: Secondary | ICD-10-CM | POA: Insufficient documentation

## 2012-11-21 DIAGNOSIS — S4980XA Other specified injuries of shoulder and upper arm, unspecified arm, initial encounter: Secondary | ICD-10-CM | POA: Insufficient documentation

## 2012-11-21 DIAGNOSIS — Y9389 Activity, other specified: Secondary | ICD-10-CM | POA: Insufficient documentation

## 2012-11-21 DIAGNOSIS — G35 Multiple sclerosis: Secondary | ICD-10-CM | POA: Insufficient documentation

## 2012-11-21 DIAGNOSIS — S6980XA Other specified injuries of unspecified wrist, hand and finger(s), initial encounter: Secondary | ICD-10-CM | POA: Insufficient documentation

## 2012-11-21 DIAGNOSIS — K219 Gastro-esophageal reflux disease without esophagitis: Secondary | ICD-10-CM | POA: Insufficient documentation

## 2012-11-21 DIAGNOSIS — Y929 Unspecified place or not applicable: Secondary | ICD-10-CM | POA: Insufficient documentation

## 2012-11-21 DIAGNOSIS — S6990XA Unspecified injury of unspecified wrist, hand and finger(s), initial encounter: Secondary | ICD-10-CM | POA: Insufficient documentation

## 2012-11-21 DIAGNOSIS — E785 Hyperlipidemia, unspecified: Secondary | ICD-10-CM | POA: Insufficient documentation

## 2012-11-21 DIAGNOSIS — Z79899 Other long term (current) drug therapy: Secondary | ICD-10-CM | POA: Insufficient documentation

## 2012-11-21 DIAGNOSIS — M549 Dorsalgia, unspecified: Secondary | ICD-10-CM

## 2012-11-21 DIAGNOSIS — F17200 Nicotine dependence, unspecified, uncomplicated: Secondary | ICD-10-CM | POA: Insufficient documentation

## 2012-11-21 DIAGNOSIS — T148XXA Other injury of unspecified body region, initial encounter: Secondary | ICD-10-CM

## 2012-11-21 MED ORDER — TRAMADOL HCL 50 MG PO TABS: 50.0000 mg | ORAL_TABLET | Freq: Four times a day (QID) | ORAL | Status: DC | PRN

## 2012-11-21 MED ORDER — IBUPROFEN 400 MG PO TABS
600.0000 mg | ORAL_TABLET | Freq: Once | ORAL | Status: AC
  Administered 2012-11-21: 600 mg via ORAL
  Filled 2012-11-21: qty 1

## 2012-11-21 NOTE — ED Notes (Addendum)
Per pt sts fell down some stairs yesterday. Per wife pt did a few flips and now is having right shoulder, right side pain. sts also every time he burps the pain radiates down his spine. Denies hitting his head or LOC

## 2012-11-21 NOTE — ED Provider Notes (Signed)
History  Scribed for Suzi Roots, MD, the patient was seen in room TR07C/TR07C. This chart was scribed by Candelaria Stagers. The patient's care started at 2:36 PM   CSN: [id number]  Arrival date & time 11/21/12  1403   First MD Initiated Contact with Patient 11/21/12 1423      Chief Complaint  Patient presents with  . Fall     The history is provided by the patient. No language interpreter was used.   [name] is a 46 y.o. male who presents to the Emergency Department complaining of right shoulder pain and lower back pain after missing a step and falling down stairs last night landing on his right side.  He was seen in ortho outpatient clinic earlier today and was advised to come to the ED in regards to radiating pain down his back.  He denies hitting his head or LOC.  No headache or nv. He denies abdominal pain, numbness, tingling, or weakness.  Pt also has small abrasions to hands and complains of right index finger pain.  Tetanus up to date within past 5 yrs. He has taken ibuprofen with little relief.  No radicular/arm or leg pain. No numbness/weakness.      Past Medical History  Diagnosis Date  . ALLERGIC RHINITIS 03/27/2010  . GERD 03/27/2010  . HYPERLIPIDEMIA 03/27/2010  . MULTIPLE SCLEROSIS, RELAPSING/REMITTING 03/27/2010  . NICOTINE ADDICTION 05/24/2010  . TMJ (dislocation of temporomandibular joint)   . Insomnia   . Neurogenic bladder     History reviewed. No pertinent past surgical history.  Family History  Problem Relation Age of Onset  . Cancer Father     lung ca    History  Substance Use Topics  . Smoking status: Current Every Day Smoker -- 0.5 packs/day    Types: Cigarettes  . Smokeless tobacco: Never Used  . Alcohol Use: Yes      Review of Systems  Gastrointestinal: Negative for abdominal pain.  Musculoskeletal: Positive for back pain (lower back pain).  Skin: Positive for wound (small abrasions to hands).  Neurological: Negative for syncope,  weakness and numbness.  All other systems reviewed and are negative.    Allergies  Amoxicillin; Ampicillin; and Morphine  Home Medications   Current Outpatient Rx  Name  Route  Sig  Dispense  Refill  . ACETAMINOPHEN ER 650 MG PO TBCR   Oral   Take 650 mg by mouth daily.           Marland Kitchen CALCIUM CARBONATE 200 MG PO CAPS   Oral   Take by mouth daily.           Marland Kitchen DIPHENHYDRAMINE-APAP (SLEEP) 25-500 MG PO TABS   Oral   Take 1 tablet by mouth at bedtime as needed. For sleep         . DONEPEZIL HCL 10 MG PO TABS   Oral   Take 10 mg by mouth daily.           Marland Kitchen ESZOPICLONE 3 MG PO TABS   Oral   Take 3 mg by mouth See admin instructions. Takes on Sat, Monday and Wednesday         . FINASTERIDE 1 MG PO TABS   Oral   Take 1 tablet (1 mg total) by mouth daily.   90 tablet   6     Needs to be seen - last seen 02/2011   . GABAPENTIN 600 MG PO TABS   Oral   Take 600  mg by mouth 3 (three) times daily.         . IBUPROFEN 800 MG PO TABS   Oral   Take 800 mg by mouth See admin instructions. Takes Saturday, Monday and wednesday         . INTERFERON BETA-1A 44 MCG/0.5ML [place] SOLN   Subcutaneous   Inject 44 mcg into the skin See admin instructions. Takes on Saturday, Monday and wednesday         . METANX 3-35-2 MG PO TABS   Oral   Take 1 tablet by mouth 2 (two) times daily.   180 tablet   1   . LORAZEPAM 2 MG PO TABS   Oral   Take 2 mg by mouth daily.         Marland Kitchen NICOTINE POLACRILEX 4 MG MT LOZG   Buccal   Place 4 mg inside cheek as needed. For nicotine craving         . OMEPRAZOLE 20 MG PO CPDR   Oral   Take 20 mg by mouth daily.           . TRAZODONE HCL 50 MG PO TABS   Oral   Take 50-100 mg by mouth See admin instructions. Takes 1 tablet every night plus an additional tablet on Sunday, Tuesday, Thursday and Friday         . VIAGRA 100 MG PO TABS   Oral   Take 100 mg by mouth as needed. For sexual activity           BP 124/81  Pulse 80   Temp 97.8 F (36.6 C) (Oral)  Resp 20  SpO2 99%  Physical Exam  Nursing note and vitals reviewed. Constitutional: He is oriented to person, place, and time. He appears well-developed and well-nourished. No distress.  HENT:  Head: Normocephalic and atraumatic.  Eyes: Conjunctivae normal are normal. Pupils are equal, round, and reactive to light.  Neck: Normal range of motion. Neck supple. No tracheal deviation present.  Cardiovascular: Normal rate.   Pulmonary/Chest: Effort normal and breath sounds normal. No respiratory distress. He exhibits no tenderness.  Abdominal: Soft. He exhibits no distension. There is no tenderness.  Musculoskeletal: Normal range of motion. He exhibits no edema and no tenderness.       Mild lower thoracic and upper lumbar tenderness, otherwise CTLS spine, non tender, aligned, no step off. Good rom bil ext inc bil shoulder without pain or limitation of rom. Distal pulses palp. Superficial, small abrasions to hand without sign of infection. No focal bony tenderness on bil ext exam.   Neurological: He is alert and oriented to person, place, and time.       Ambulates w steady gait. Motor intact bil.   Skin: Skin is warm and dry.  Psychiatric: He has a normal mood and affect. His behavior is normal.    ED Course  Procedures  DIAGNOSTIC STUDIES: Oxygen Saturation is 99% on room air, normal by my interpretation.    COORDINATION OF CARE:  14:41 Ordered: DG Lumbar Spine Complete; DG Thoracic Spine 2 View  Results for orders placed in visit on 03/27/10  CONVERTED [facility] LAB      Component Value Range   Sodium 141  135-145 meq/L   Potassium 4.1  3.5-5.1 meq/L   Chloride 100  96-112 meq/L   CO2 34 (*) 19-32 meq/L   Glucose, Bld 96  70-99 mg/dL   BUN 20  1-61 mg/dL   Creatinine, Ser 1.3  0.4-1.5  mg/dL   Calcium 9.4  8.1-19.1 mg/dL   GFR calc non Af Amer 63.61  >60 mL/min   WBC 6.8  4.5-10.5 10*3/microliter   RBC 4.74  4.22-5.81 M/uL   Hemoglobin 14.9   13.0-17.0 g/dL   HCT 47.8  29.5-62.1 %   MCV 94.1  78.0-100.0 fL   MCHC 33.4  30.0-36.0 g/dL   RDW 30.8  65.7-84.6 %   Platelets 167.0  150.0-400.0 K/uL   Neutrophils Relative 68.3  43.0-77.0 %   Lymphocytes Relative 19.4  12.0-46.0 %   Monocytes Relative 11.0  3.0-12.0 %   Eosinophils Relative 1.1  0.0-5.0 %   Basophils Relative 0.2  0.0-3.0 %   Neutro Abs 4.7  1.4-7.7 K/uL   Lymphs Abs 1.3  0.7-4.0 K/uL   Monocytes Absolute 0.7  0.1-1.0 K/uL   Eosinophils Absolute 0.1  0.0-0.7 K/uL   Basophils Absolute 0.0  0.0-0.1 K/uL   Total Bilirubin 0.9  0.3-1.2 mg/dL   Bilirubin, Direct 0.1  0.0-0.3 mg/dL   Alkaline Phosphatase 79  39-117 units/L   AST 31  0-37 units/L   ALT 33  0-53 units/L   Total Protein 7.0  6.0-8.3 g/dL   Albumin 4.1  9.6-2.9 g/dL   TSH 5.28  4.13-2.44 microintl units/mL   Cholesterol 199  0-200 mg/dL   Dg Thoracic Spine 2 View  11/21/2012  *RADIOLOGY REPORT*  Clinical Data: Fall, back pain  THORACIC SPINE - 2 VIEW  Comparison: Concurrently obtained radiographs of the lumbosacral spine  Findings: AP and lateral views of the thoracic spine demonstrate no evidence of acute fracture, or malalignment.  Vertebral body heights and intervertebral disc spaces are maintained.  Visualized ribs are intact.  Incompletely imaged cervical spondylosis.  IMPRESSION:  Negative radiographs of the thoracic spine.   Original Report Authenticated By: Malachy Moan, M.D.    Dg Lumbar Spine Complete  11/21/2012  *RADIOLOGY REPORT*  Clinical Data: Fall, back pain  LUMBAR SPINE - COMPLETE 4+ VIEW  Comparison: Concurrently obtained radiographs of the thoracic spine  Findings: AP and lateral and bilateral oblique radiographs of the lumbosacral spine demonstrate no evidence of acute fracture or malalignment.  Vertebral body heights and intervertebral disc spaces are maintained.  No spondylolisthesis.  Mild straightening of the normal lumbar lordosis.  No significant foraminal stenosis on the  oblique views.  Visualized bowel gas pattern is unremarkable.  IMPRESSION:  No acute fracture or malalignment identified.  Mild straightening of the normal lumbar lordosis may be related to muscle spasm.   Original Report Authenticated By: Malachy Moan, M.D.        MDM  I personally performed the services described in this documentation, which was scribed in my presence. The recorded information has been reviewed and is accurate.  Recheck spine nt. abd soft nt. Pt alert, content comfortable.   Appears stable for d/c.      Suzi Roots, MD 11/21/12 [phone]

## 2012-12-11 ENCOUNTER — Ambulatory Visit: Payer: BC Managed Care – PPO | Admitting: Internal Medicine

## 2012-12-12 ENCOUNTER — Encounter (HOSPITAL_COMMUNITY): Payer: Self-pay | Admitting: Emergency Medicine

## 2012-12-12 ENCOUNTER — Emergency Department (INDEPENDENT_AMBULATORY_CARE_PROVIDER_SITE_OTHER)
Admission: EM | Admit: 2012-12-12 | Discharge: 2012-12-12 | Disposition: A | Discharge status: Home / Self Care | Payer: BC Managed Care – PPO | Source: Home / Self Care | Attending: Family Medicine | Admitting: Family Medicine

## 2012-12-12 ENCOUNTER — Emergency Department (INDEPENDENT_AMBULATORY_CARE_PROVIDER_SITE_OTHER): Payer: BC Managed Care – PPO

## 2012-12-12 DIAGNOSIS — J90 Pleural effusion, not elsewhere classified: Secondary | ICD-10-CM

## 2012-12-12 DIAGNOSIS — S2239XA Fracture of one rib, unspecified side, initial encounter for closed fracture: Secondary | ICD-10-CM

## 2012-12-12 DIAGNOSIS — J942 Hemothorax: Secondary | ICD-10-CM

## 2012-12-12 DIAGNOSIS — S2231XA Fracture of one rib, right side, initial encounter for closed fracture: Secondary | ICD-10-CM

## 2012-12-12 NOTE — ED Notes (Signed)
Pt c/o chest left sided chest pain. rx fall injuring chest. Pt states that he is taking pain medication but is having concerns of how long pain has lasted.    Left sided chest and flank pain. Having chills.

## 2012-12-12 NOTE — ED Notes (Addendum)
Registration Associate requested assessment of this patient due to c/o left side chest pn. Pt. Reports Hx of fall for which he was seen at [facility] and D/Cd with Rx for narcotic pn medication. Pt reports increased pn in LT chest, flank and abdomen, "GI symptoms", and chills. Pt is concerned that the chest pn may be abnormal considering duration.

## 2012-12-12 NOTE — ED Provider Notes (Signed)
History     CSN: [id number]  Arrival date & time 12/12/12  1354   First MD Initiated Contact with Patient 12/12/12 1456      Chief Complaint  Patient presents with  . Chest Pain    left sided chest pain.recent hx of fall injuring chest. on pain meds.     (Consider location/radiation/quality/duration/timing/severity/associated sxs/prior treatment) Patient is a 47 y.o. male presenting with chest pain. The history is provided by the patient and the spouse.  Chest Pain The chest pain began more than 2 weeks ago ([facility], , since has had chills aching, continues sore.). The chest pain is worsening (did not get flu vaccination.). The severity of the pain is mild. The quality of the pain is described as aching. The pain radiates to the mid back. Primary symptoms include cough. Pertinent negatives for primary symptoms include no nausea and no vomiting.     Past Medical History  Diagnosis Date  . ALLERGIC RHINITIS 03/27/2010  . GERD 03/27/2010  . HYPERLIPIDEMIA 03/27/2010  . MULTIPLE SCLEROSIS, RELAPSING/REMITTING 03/27/2010  . NICOTINE ADDICTION 05/24/2010  . TMJ (dislocation of temporomandibular joint)   . Insomnia   . Neurogenic bladder     Past Surgical History  Procedure Date  . Ankle surgery   . Foot surgery     Family History  Problem Relation Age of Onset  . Cancer Father     lung ca    History  Substance Use Topics  . Smoking status: Current Every Day Smoker -- 0.5 packs/day    Types: Cigarettes  . Smokeless tobacco: Never Used  . Alcohol Use: Yes      Review of Systems  Constitutional: Positive for chills.  Respiratory: Positive for cough.   Cardiovascular: Positive for chest pain.  Gastrointestinal: Negative.  Negative for nausea and vomiting.  Musculoskeletal: Positive for myalgias.    Allergies  Amoxicillin; Ampicillin; and Morphine  Home Medications   Current Outpatient Rx  Name  Route  Sig   Dispense  Refill  . CALCIUM CARBONATE 200 MG PO CAPS   Oral   Take by mouth daily.           . DONEPEZIL HCL 10 MG PO TABS   Oral   Take 10 mg by mouth daily.           Marland Kitchen ESZOPICLONE 3 MG PO TABS   Oral   Take 3 mg by mouth See admin instructions. Takes on Sat, Monday and Wednesday         . FINASTERIDE 1 MG PO TABS   Oral   Take 1 tablet (1 mg total) by mouth daily.   90 tablet   6     Needs to be seen - last seen 02/2011   . GABAPENTIN 600 MG PO TABS   Oral   Take 600 mg by mouth 3 (three) times daily.         . INTERFERON BETA-1A 44 MCG/0.5ML [place] SOLN   Subcutaneous   Inject 44 mcg into the skin See admin instructions. Takes on Saturday, Monday and wednesday         . METANX 3-35-2 MG PO TABS   Oral   Take 1 tablet by mouth 2 (two) times daily.   180 tablet   1   . OMEPRAZOLE 20 MG PO CPDR   Oral   Take 20 mg by mouth daily.           Marland Kitchen  ACETAMINOPHEN ER 650 MG PO TBCR   Oral   Take 650 mg by mouth daily.           Marland Kitchen DIPHENHYDRAMINE-APAP (SLEEP) 25-500 MG PO TABS   Oral   Take 1 tablet by mouth at bedtime as needed. For sleep         . IBUPROFEN 800 MG PO TABS   Oral   Take 800 mg by mouth See admin instructions. Takes Saturday, Monday and wednesday         . LORAZEPAM 2 MG PO TABS   Oral   Take 2 mg by mouth daily.         Marland Kitchen NICOTINE POLACRILEX 4 MG MT LOZG   Buccal   Place 4 mg inside cheek as needed. For nicotine craving         . TRAMADOL HCL 50 MG PO TABS   Oral   Take 1 tablet (50 mg total) by mouth every 6 (six) hours as needed for pain.   15 tablet   0   . TRAZODONE HCL 50 MG PO TABS   Oral   Take 50-100 mg by mouth See admin instructions. Takes 1 tablet every night plus an additional tablet on Sunday, Tuesday, Thursday and Friday         . VIAGRA 100 MG PO TABS   Oral   Take 100 mg by mouth as needed. For sexual activity           BP 129/88  Pulse 87  Temp 97.9 F (36.6 C) (Oral)  Resp 18  SpO2  100%  Physical Exam  Constitutional: He is oriented to person, place, and time. He appears well-developed and well-nourished.  HENT:  Head: Normocephalic.  Right Ear: External ear normal.  Left Ear: External ear normal.  Mouth/Throat: Oropharynx is clear and moist.  Eyes: Pupils are equal, round, and reactive to light.  Neck: Normal range of motion. Neck supple.  Cardiovascular: Normal rate, regular rhythm, normal heart sounds and intact distal pulses.   Pulmonary/Chest: He has decreased breath sounds in the right lower field. He has no wheezes. He has no rhonchi. He has no rales.  Abdominal: Soft. Bowel sounds are normal.  Lymphadenopathy:    He has no cervical adenopathy.  Neurological: He is alert and oriented to person, place, and time.  Skin: Skin is warm and dry.    ED Course  Procedures (including critical care time)  Labs Reviewed - No data to display Dg Chest 2 View  12/12/2012  *RADIOLOGY REPORT*  Clinical Data: Larey Seat approximate 3 weeks ago, persistent right-sided chest pain.  CHEST - 2 VIEW  Comparison: None.  Findings: Cardiomediastinal silhouette unremarkable.  Large right pleural effusion and associated consolidation in the right lower lobe.  Lungs otherwise clear.  No visible left pleural effusion. Mildly displaced fracture involving the posterior right 8th rib.  IMPRESSION: Mildly displaced fracture involving the posterior right 8th rib. Associated large right pleural effusion/hemarthrosis and passive atelectasis in the right lower lobe.  Results were discussed by telephone with Dr. Artis Flock at the time of interpretation on 12/12/2012 at 1718 hours.   Original Report Authenticated By: Hulan Saas, M.D.      1. Fracture of rib of right side   2. Hemothorax on right       MDM  X-rays reviewed and report per radiologist. Discussed with Dr. Laneta Simmers , will see mon in office for further care.         Fayrene Fearing  Sallyanne Kuster, MD 12/12/12 4540

## 2012-12-14 ENCOUNTER — Other Ambulatory Visit: Payer: Self-pay | Admitting: Surgery

## 2012-12-14 ENCOUNTER — Other Ambulatory Visit: Payer: Self-pay | Admitting: *Deleted

## 2012-12-14 ENCOUNTER — Institutional Professional Consult (permissible substitution) (INDEPENDENT_AMBULATORY_CARE_PROVIDER_SITE_OTHER): Payer: BC Managed Care – PPO | Admitting: Surgery

## 2012-12-14 ENCOUNTER — Encounter: Payer: Self-pay | Admitting: Surgery

## 2012-12-14 VITALS — BP 108/84 | HR 96 | Resp 20 | Ht 70.75 in | Wt 162.0 lb

## 2012-12-14 DIAGNOSIS — J90 Pleural effusion, not elsewhere classified: Secondary | ICD-10-CM

## 2012-12-14 DIAGNOSIS — S2239XA Fracture of one rib, unspecified side, initial encounter for closed fracture: Secondary | ICD-10-CM

## 2012-12-14 DIAGNOSIS — J942 Hemothorax: Secondary | ICD-10-CM

## 2012-12-14 NOTE — Progress Notes (Signed)
[address]            [place] [address]          [phone]      PCP is Rogelia Boga, MD Referring Provider is Kindl, Quita Skye, MD  Chief Complaint  Patient presents with  . Hemothorax    surgical eval for right side rib FX and right hemothorax  . Rib Fracture    HPI:  The patient is a 47 year old gentleman with multiple sclerosis who reports falling down steps about 3 weeks ago carrying a shop-vac. He struck the right side of his back. The following day he presented to the emergency room and had x-rays of his spine which were negative. His wife reports that since then he has been feeling poorly with decreased energy and loss of appetite as well as shortness of breath and pain in the right side of his chest and back. His wife reports that he has had some chills but she has not checked his temperature. He presented to Urgent Care over the weekend and was seen by Dr. Artis Flock. He had a chest x-ray performed. This showed a nondisplaced posterior right eighth rib fracture. The radiologist read the x-ray as showing a large right pleural effusion but I have reviewed the x-ray myself and this is a small right pleural effusion with mild atelectasis at the right base. The lungs are otherwise clear. Past Medical History  Diagnosis Date  . ALLERGIC RHINITIS 03/27/2010  . GERD 03/27/2010  . HYPERLIPIDEMIA 03/27/2010  . MULTIPLE SCLEROSIS, RELAPSING/REMITTING 03/27/2010  . NICOTINE ADDICTION 05/24/2010  . TMJ (dislocation of temporomandibular joint)   . Insomnia   . Neurogenic bladder     Past Surgical History  Procedure Date  . Ankle surgery   . Foot surgery     Family History  Problem Relation Age of Onset  . Cancer Father     lung ca    Social History History  Substance Use Topics  . Smoking status: Former Smoker -- 0.5 packs/day    Types: Cigarettes    Start date: 10/09/2012  . Smokeless tobacco: Never Used  . Alcohol Use: Yes     Current Outpatient Prescriptions  Medication Sig Dispense Refill  . acetaminophen (TYLENOL) 650 MG CR tablet Take 650 mg by mouth daily.        . calcium carbonate 200 MG capsule Take by mouth daily.        . diphenhydramine-acetaminophen (TYLENOL PM) 25-500 MG TABS Take 1 tablet by mouth at bedtime as needed. For sleep      . donepezil (ARICEPT) 10 MG tablet Take 10 mg by mouth daily.        . Eszopiclone (ESZOPICLONE) 3 MG TABS Take 3 mg by mouth See admin instructions. Takes on Sat, Monday and Wednesday      . finasteride (PROPECIA) 1 MG tablet Take 1 tablet (1 mg total) by mouth daily.  90 tablet  6  . gabapentin (NEURONTIN) 600 MG tablet Take 600 mg by mouth 3 (three) times daily.      Marland Kitchen ibuprofen (ADVIL,MOTRIN) 800 MG tablet Take 800 mg by mouth See admin instructions. Takes Saturday, Monday and wednesday      . interferon beta-1a (REBIF) 44 MCG/0.5ML injection Inject 44 mcg into the skin See admin instructions. Takes on Saturday, Monday and wednesday      . L-Methylfolate-B6-B12 [facility])  3-35-2 MG TABS Take 1 tablet by mouth 2 (two) times daily.  180 tablet  1  . LORazepam (ATIVAN) 2 MG tablet Take 2 mg by mouth daily.      . nicotine polacrilex (COMMIT) 4 MG lozenge Place 4 mg inside cheek as needed. For nicotine craving      . omeprazole (PRILOSEC) 20 MG capsule Take 20 mg by mouth daily.        . traZODone (DESYREL) 50 MG tablet Take 50-100 mg by mouth See admin instructions. Takes 1 tablet every night plus an additional tablet on Sunday, Tuesday, Thursday and Friday      . VIAGRA 100 MG tablet Take 100 mg by mouth as needed. For sexual activity        Allergies  Allergen Reactions  . Amoxicillin Swelling  . Ampicillin Swelling  . Morphine Swelling    Review of Systems  Constitutional: Positive for appetite change, fatigue and unexpected weight change.  HENT: Negative.   Eyes: Negative.   Respiratory: Positive for shortness of breath.        When lying flat.   Cardiovascular: Negative.   Gastrointestinal: Negative.        Reflux and heartburn  Genitourinary: Negative.   Musculoskeletal: Negative.   Neurological:       Memory problems due to MS  Psychiatric/Behavioral: Negative.     BP 108/84  Pulse 96  Resp 20  Ht 5' 10.75" (1.797 m)  Wt 162 lb (73.483 kg)  BMI 22.75 kg/m2  SpO2 96% Physical Exam  Constitutional: He appears well-developed and well-nourished. No distress.       Says he feels fine.  Cardiovascular: Normal rate and normal heart sounds.  Exam reveals no gallop and no friction rub.   No murmur heard. Pulmonary/Chest: No respiratory distress. He has no wheezes. He has no rales. He exhibits no tenderness.       Decreased breath sounds at right base      Impression:  He suffered a nondisplaced right posterior eighth rib fracture about 3 weeks ago and now has a small right pleural effusion. There is mild atelectasis at the right base. The spine films that were done the following day after his fall in the emergency room did not show any right pleural effusion and I suspect that he probably had a small amount of blood in the pleural space related to the rib fracture and this stimulated some serous pleural effusion subsequently. Since it is a small pleural effusion I do not think there is any indication at this time for thoracentesis or chest tube. He looks well and said that he feels well so we should continue to follow this for now. His wife was concerned about the possibility of infection because he was reporting some chills but he has had no documented fever. I will send him today for a CBC to check his white blood cell count. His wife also requested that he have a BUN and creatinine because he was suppose to have that drawn for Dr. Sandria Manly today. I told them  that I would call them with any abnormalities in the lab work.  Plan:  I will see him back in 3 weeks with a repeat chest x-ray. They will contact me if he develops any  worsening symptoms of chest pain, shortness of breath, or fevers.

## 2012-12-15 LAB — CBC
Hemoglobin: 12.1 g/dL — ABNORMAL LOW (ref 13.0–17.0)
MCH: 30.1 pg (ref 26.0–34.0)
MCHC: 33.2 g/dL (ref 30.0–36.0)
MCV: 90.5 fL (ref 78.0–100.0)
RBC: 4.02 MIL/uL — ABNORMAL LOW (ref 4.22–5.81)

## 2012-12-15 LAB — BASIC METABOLIC PANEL
BUN: 25 mg/dL — ABNORMAL HIGH (ref 6–23)
CO2: 30 mEq/L (ref 19–32)
Calcium: 9.3 mg/dL (ref 8.4–10.5)
Creat: 1.33 mg/dL (ref 0.50–1.35)
Glucose, Bld: 90 mg/dL (ref 70–99)

## 2012-12-24 ENCOUNTER — Telehealth: Payer: Self-pay | Admitting: *Deleted

## 2012-12-24 NOTE — Telephone Encounter (Signed)
Left message on voicemail. Spoke to Dr. Amador Cunas regarding pt and he said he would like to see pt tomorrow or Monday to follow up from ED visit, but if they want a referral to Nephrologist that he would do it, whatever pt wants.

## 2012-12-25 NOTE — Telephone Encounter (Signed)
Pt called back. Appt set Mon @ 3:30pm.

## 2012-12-25 NOTE — Telephone Encounter (Signed)
Left message on voicemail. For pt letting him know Dr. Amador Cunas would like to see him today we have openings or if he wants just a referral.

## 2012-12-25 NOTE — Telephone Encounter (Signed)
Noted, Dr. Amador Cunas made aware appointment on Monday for follow up.

## 2012-12-28 ENCOUNTER — Encounter: Payer: Self-pay | Admitting: Internal Medicine

## 2012-12-28 ENCOUNTER — Ambulatory Visit (INDEPENDENT_AMBULATORY_CARE_PROVIDER_SITE_OTHER): Payer: BC Managed Care – PPO | Admitting: Internal Medicine

## 2012-12-28 VITALS — BP 110/68 | HR 102 | Temp 98.4°F | Resp 16 | Wt 172.0 lb

## 2012-12-28 DIAGNOSIS — R634 Abnormal weight loss: Secondary | ICD-10-CM

## 2012-12-28 DIAGNOSIS — J90 Pleural effusion, not elsewhere classified: Secondary | ICD-10-CM

## 2012-12-28 DIAGNOSIS — G35 Multiple sclerosis: Secondary | ICD-10-CM
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
## 2012-12-28 NOTE — Patient Instructions (Addendum)
Followup with Dr. Laneta Simmers  in one week Chest CT as discussed  Call if any clinical worsening

## 2012-12-29 LAB — COMPREHENSIVE METABOLIC PANEL
ALT: 21 U/L (ref 0–53)
AST: 29 U/L (ref 0–37)
Alkaline Phosphatase: 65 U/L (ref 39–117)
BUN: 21 mg/dL (ref 6–23)
Calcium: 8.8 mg/dL (ref 8.4–10.5)
Creatinine, Ser: 1.2 mg/dL (ref 0.4–1.5)
Total Bilirubin: 0.7 mg/dL (ref 0.3–1.2)

## 2012-12-29 LAB — CBC WITH DIFFERENTIAL/PLATELET
Basophils Absolute: 0.1 10*3/uL (ref 0.0–0.1)
Basophils Relative: 0.6 % (ref 0.0–3.0)
Eosinophils Absolute: 0.2 10*3/uL (ref 0.0–0.7)
Eosinophils Relative: 1.5 % (ref 0.0–5.0)
HCT: 36.6 % — ABNORMAL LOW (ref 39.0–52.0)
Hemoglobin: 11.8 g/dL — ABNORMAL LOW (ref 13.0–17.0)
Lymphocytes Relative: 12.6 % (ref 12.0–46.0)
Lymphs Abs: 1.7 10*3/uL (ref 0.7–4.0)
MCHC: 32.3 g/dL (ref 30.0–36.0)
MCV: 88.5 fl (ref 78.0–100.0)
Monocytes Absolute: 1.8 10*3/uL — ABNORMAL HIGH (ref 0.1–1.0)
Monocytes Relative: 13.3 % — ABNORMAL HIGH (ref 3.0–12.0)
Neutro Abs: 9.7 10*3/uL — ABNORMAL HIGH (ref 1.4–7.7)
Neutrophils Relative %: 72 % (ref 43.0–77.0)
Platelets: 425 10*3/uL — ABNORMAL HIGH (ref 150.0–400.0)
RBC: 4.13 Mil/uL — ABNORMAL LOW (ref 4.22–5.81)
RDW: 13.5 % (ref 11.5–14.6)
WBC: 13.5 10*3/uL — ABNORMAL HIGH (ref 4.5–10.5)

## 2012-12-29 LAB — TSH: TSH: 0.53 u[IU]/mL (ref 0.35–5.50)

## 2012-12-30 ENCOUNTER — Encounter: Payer: Self-pay | Admitting: Internal Medicine

## 2012-12-30 ENCOUNTER — Telehealth: Payer: Self-pay | Admitting: Internal Medicine

## 2012-12-30 NOTE — Telephone Encounter (Signed)
Patient called and laboratory studies discussed.  Chest CT is scheduled for 2 days. He is agreeable to attempting to get this scheduled earlier

## 2012-12-30 NOTE — Telephone Encounter (Signed)
Pt would like blood work results °

## 2012-12-30 NOTE — Telephone Encounter (Signed)
Rescheduled CT. Pt is aware that she is scheduled tomorrow at 4:00

## 2012-12-31 ENCOUNTER — Encounter (HOSPITAL_COMMUNITY): Payer: Self-pay | Admitting: *Deleted

## 2012-12-31 ENCOUNTER — Telehealth: Payer: Self-pay | Admitting: Internal Medicine

## 2012-12-31 ENCOUNTER — Inpatient Hospital Stay (HOSPITAL_COMMUNITY)
Admission: EM | Admit: 2012-12-31 | Discharge: 2013-01-05 | DRG: 076 | Disposition: A | Discharge status: Home / Self Care | Payer: BC Managed Care – PPO | Attending: Thoracic Surgery (Cardiothoracic Vascular Surgery) | Admitting: Thoracic Surgery (Cardiothoracic Vascular Surgery)

## 2012-12-31 ENCOUNTER — Ambulatory Visit (INDEPENDENT_AMBULATORY_CARE_PROVIDER_SITE_OTHER)
Admission: RE | Admit: 2012-12-31 | Discharge: 2012-12-31 | Disposition: A | Discharge status: Home / Self Care | Payer: BC Managed Care – PPO | Source: Ambulatory Visit | Attending: Internal Medicine | Admitting: Internal Medicine

## 2012-12-31 ENCOUNTER — Observation Stay (HOSPITAL_COMMUNITY): Payer: BC Managed Care – PPO

## 2012-12-31 DIAGNOSIS — K219 Gastro-esophageal reflux disease without esophagitis: Secondary | ICD-10-CM | POA: Diagnosis present

## 2012-12-31 DIAGNOSIS — J90 Pleural effusion, not elsewhere classified: Principal | ICD-10-CM | POA: Diagnosis present

## 2012-12-31 DIAGNOSIS — F17200 Nicotine dependence, unspecified, uncomplicated: Secondary | ICD-10-CM | POA: Diagnosis present

## 2012-12-31 DIAGNOSIS — M26609 Unspecified temporomandibular joint disorder, unspecified side: Secondary | ICD-10-CM | POA: Diagnosis present

## 2012-12-31 DIAGNOSIS — S2249XA Multiple fractures of ribs, unspecified side, initial encounter for closed fracture: Secondary | ICD-10-CM | POA: Diagnosis present

## 2012-12-31 DIAGNOSIS — J9819 Other pulmonary collapse: Secondary | ICD-10-CM | POA: Diagnosis present

## 2012-12-31 DIAGNOSIS — Y998 Other external cause status: Secondary | ICD-10-CM

## 2012-12-31 DIAGNOSIS — Z79899 Other long term (current) drug therapy: Secondary | ICD-10-CM

## 2012-12-31 DIAGNOSIS — S2239XA Fracture of one rib, unspecified side, initial encounter for closed fracture: Secondary | ICD-10-CM | POA: Diagnosis present

## 2012-12-31 DIAGNOSIS — G35 Multiple sclerosis: Secondary | ICD-10-CM | POA: Diagnosis present

## 2012-12-31 DIAGNOSIS — G4700 Insomnia, unspecified: Secondary | ICD-10-CM | POA: Diagnosis present

## 2012-12-31 DIAGNOSIS — E785 Hyperlipidemia, unspecified: Secondary | ICD-10-CM | POA: Diagnosis present

## 2012-12-31 DIAGNOSIS — Y93E3 Activity, vacuuming: Secondary | ICD-10-CM

## 2012-12-31 DIAGNOSIS — N319 Neuromuscular dysfunction of bladder, unspecified: Secondary | ICD-10-CM | POA: Diagnosis present

## 2012-12-31 DIAGNOSIS — W010XXA Fall on same level from slipping, tripping and stumbling without subsequent striking against object, initial encounter: Secondary | ICD-10-CM | POA: Diagnosis present

## 2012-12-31 DIAGNOSIS — Y92009 Unspecified place in unspecified non-institutional (private) residence as the place of occurrence of the external cause: Secondary | ICD-10-CM

## 2012-12-31 DIAGNOSIS — E876 Hypokalemia: Secondary | ICD-10-CM | POA: Diagnosis not present

## 2012-12-31 DIAGNOSIS — S2231XA Fracture of one rib, right side, initial encounter for closed fracture: Secondary | ICD-10-CM

## 2012-12-31 HISTORY — DX: Fracture of one rib, unspecified side, initial encounter for closed fracture: S22.39XA

## 2012-12-31 HISTORY — DX: Pleural effusion, not elsewhere classified: J90

## 2012-12-31 HISTORY — DX: Multiple fractures of ribs, unspecified side, initial encounter for closed fracture: S22.49XA

## 2012-12-31 LAB — CBC WITH DIFFERENTIAL/PLATELET
Eosinophils Absolute: 0.2 10*3/uL (ref 0.0–0.7)
Eosinophils Relative: 2 % (ref 0–5)
Hemoglobin: 12.3 g/dL — ABNORMAL LOW (ref 13.0–17.0)
Lymphs Abs: 1.6 10*3/uL (ref 0.7–4.0)
MCH: 29.6 pg (ref 26.0–34.0)
MCHC: 33.4 g/dL (ref 30.0–36.0)
MCV: 88.5 fL (ref 78.0–100.0)
Monocytes Relative: 11 % (ref 3–12)
Platelets: 438 10*3/uL — ABNORMAL HIGH (ref 150–400)
RBC: 4.16 MIL/uL — ABNORMAL LOW (ref 4.22–5.81)

## 2012-12-31 LAB — BODY FLUID CELL COUNT WITH DIFFERENTIAL
Lymphs, Fluid: 25 %
Monocyte-Macrophage-Serous Fluid: 48 % — ABNORMAL LOW (ref 50–90)
Total Nucleated Cell Count, Fluid: 1254 cu mm — ABNORMAL HIGH (ref 0–1000)

## 2012-12-31 LAB — HEMATOCRIT, BODY FLUID

## 2012-12-31 LAB — COMPREHENSIVE METABOLIC PANEL
BUN: 18 mg/dL (ref 6–23)
Calcium: 9.3 mg/dL (ref 8.4–10.5)
GFR calc Af Amer: >90 mL/min (ref >90)
Glucose, Bld: 124 mg/dL — ABNORMAL HIGH (ref 70–99)
Total Protein: 6.9 g/dL (ref 6.0–8.3)

## 2012-12-31 LAB — PROTEIN, BODY FLUID

## 2012-12-31 MED ORDER — IBUPROFEN 800 MG PO TABS
800.0000 mg | ORAL_TABLET | Freq: Every evening | ORAL | Status: DC | PRN
  Filled 2012-12-31: qty 1

## 2012-12-31 MED ORDER — DONEPEZIL HCL 10 MG PO TABS
10.0000 mg | ORAL_TABLET | Freq: Every day | ORAL | Status: DC
  Administered 2012-12-31 – 2013-01-05 (×6): 10 mg via ORAL
  Filled 2012-12-31 (×6): qty 1

## 2012-12-31 MED ORDER — CALCIUM CARBONATE 200 MG PO CAPS: 200.0000 mg | ORAL_CAPSULE | Freq: Every day | ORAL | Status: DC

## 2012-12-31 MED ORDER — NICOTINE 14 MG/24HR TD PT24
14.0000 mg | MEDICATED_PATCH | Freq: Every day | TRANSDERMAL | Status: DC
  Administered 2012-12-31 – 2013-01-05 (×6): 14 mg via TRANSDERMAL
  Filled 2012-12-31 (×6): qty 1

## 2012-12-31 MED ORDER — FINASTERIDE 1 MG PO TABS
1.0000 mg | ORAL_TABLET | Freq: Every day | ORAL | Status: DC
  Filled 2012-12-31 (×7): qty 1

## 2012-12-31 MED ORDER — TRAZODONE HCL 50 MG PO TABS
50.0000 mg | ORAL_TABLET | ORAL | Status: DC
  Administered 2012-12-31: 100 mg via ORAL
  Filled 2012-12-31: qty 2

## 2012-12-31 MED ORDER — GABAPENTIN 600 MG PO TABS
600.0000 mg | ORAL_TABLET | Freq: Three times a day (TID) | ORAL | Status: DC
  Administered 2012-12-31 – 2013-01-05 (×14): 600 mg via ORAL
  Filled 2012-12-31 (×18): qty 1

## 2012-12-31 MED ORDER — CALCIUM CARBONATE ANTACID 500 MG PO CHEW
1.0000 | CHEWABLE_TABLET | Freq: Every day | ORAL | Status: DC
  Filled 2012-12-31 (×2): qty 1

## 2012-12-31 MED ORDER — PANTOPRAZOLE SODIUM 40 MG PO TBEC
40.0000 mg | DELAYED_RELEASE_TABLET | Freq: Every day | ORAL | Status: DC
  Administered 2012-12-31 – 2013-01-05 (×6): 40 mg via ORAL
  Filled 2012-12-31 (×6): qty 1

## 2012-12-31 MED ORDER — OXYCODONE HCL 5 MG PO TABS: 10.0000 mg | ORAL_TABLET | ORAL | Status: DC | PRN

## 2012-12-31 MED ORDER — ACETAMINOPHEN 325 MG PO TABS: 650.0000 mg | ORAL_TABLET | Freq: Four times a day (QID) | ORAL | Status: DC | PRN

## 2012-12-31 MED ORDER — ZOLPIDEM TARTRATE 5 MG PO TABS: 10.0000 mg | ORAL_TABLET | Freq: Every evening | ORAL | Status: DC | PRN
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
## 2012-12-31 NOTE — H&P (Signed)
CARDIOTHORACIC SURGERY HISTORY AND PHYSICAL EXAM  PCP is Rogelia Boga, MD   Chief Complaint:  Shortness of breath  HPI:  Patient is a 47 year old male with history of multiple sclerosis who suffered a fall nearly 6 weeks ago resulting in nondisplaced rib fracture on the right side. Since then he has developed gradual progression of worsening shortness of breath. He originally presented several weeks ago when a chest x-ray was performed at urgent care documenting the presence of fracture of the 8th rib with an associated small pleural effusion.  He was seen in consultation on January 6 by Dr. Laneta Simmers who planned to see him again next week with a follow up chest x-ray.  However, over the last couple of weeks the patient has had continued worsening of shortness of breath associated with intermittent chills without fever. The patient has not had any significant pain he reports that he no longer has much pain related to the fall he sustained previously. His appetite remains poor and he has lost some weight. He denies any productive cough. He denies any pleuritic chest pain. He underwent a chest CT scan earlier today and was contacted directly by the radiologist who read the exam and recommended that the patient presents directly to the emergency room.  Past Medical History  Diagnosis Date  . ALLERGIC RHINITIS 03/27/2010  . GERD 03/27/2010  . HYPERLIPIDEMIA 03/27/2010  . MULTIPLE SCLEROSIS, RELAPSING/REMITTING 03/27/2010  . NICOTINE ADDICTION 05/24/2010  . TMJ (dislocation of temporomandibular joint)   . Insomnia   . Neurogenic bladder   . Rib fracture 11/2012    Past Surgical History  Procedure Date  . Ankle surgery   . Foot surgery     Family History  Problem Relation Age of Onset  . Cancer Father     lung ca    Social History History  Substance Use Topics  . Smoking status: Former Smoker -- 0.5 packs/day    Types: Cigarettes    Start date: 10/16/2012  .  Smokeless tobacco: Former Neurosurgeon    Quit date: 10/31/2012  . Alcohol Use: Yes     Comment: occasionally    Prior to Admission medications   Medication Sig Start Date End Date Taking? Authorizing Provider  acetaminophen (TYLENOL) 650 MG CR tablet Take 650 mg by mouth daily.     Yes Historical Provider, MD  calcium carbonate 200 MG capsule Take by mouth daily.     Yes Historical Provider, MD  diphenhydramine-acetaminophen (TYLENOL PM) 25-500 MG TABS Take 1 tablet by mouth at bedtime as needed. For sleep   Yes Historical Provider, MD  donepezil (ARICEPT) 10 MG tablet Take 10 mg by mouth daily.     Yes Historical Provider, MD  Eszopiclone (ESZOPICLONE) 3 MG TABS Take 3 mg by mouth See admin instructions. Takes on Sat, Monday and Wednesday   Yes Historical Provider, MD  finasteride (PROPECIA) 1 MG tablet Take 1 tablet (1 mg total) by mouth daily. 04/02/12  Yes Gordy Savers, MD  gabapentin (NEURONTIN) 600 MG tablet Take 600 mg by mouth 3 (three) times daily. 03/27/11  Yes Gordy Savers, MD  ibuprofen (ADVIL,MOTRIN) 800 MG tablet Take 800 mg by mouth at bedtime as needed.   Yes Historical Provider, MD  interferon beta-1a (REBIF) 44 MCG/0.5ML injection Inject 44 mcg into the skin See admin instructions. Takes on Saturday, Monday and wednesday   Yes Historical Provider, MD  L-Methylfolate-B6-B12 (METANX) 3-35-2 MG TABS Take 1  tablet by mouth 2 (two) times daily. 03/27/11  Yes Gordy Savers, MD  LORazepam (ATIVAN) 2 MG tablet Take 2 mg by mouth daily.   Yes Historical Provider, MD  nicotine polacrilex (COMMIT) 4 MG lozenge Place 2 mg inside cheek as needed. For nicotine craving   Yes Historical Provider, MD  omeprazole (PRILOSEC) 20 MG capsule Take 20 mg by mouth daily.     Yes Historical Provider, MD  traZODone (DESYREL) 50 MG tablet Take 50-100 mg by mouth See admin instructions. Takes 1 tablet every night plus an additional tablet on Sunday, Tuesday, Thursday and Friday 09/03/11  Yes Gordy Savers, MD  VIAGRA 100 MG tablet Take 100 mg by mouth as needed. For sexual activity 01/17/12  Yes Historical Provider, MD    Allergies  Allergen Reactions  . Amoxicillin Swelling  . Ampicillin Swelling  . Morphine Swelling    Review of Systems:  General:  decreased appetite, decreased energy   Respiratory:  no cough, no wheezing, no hemoptysis, no pain with inspiration or cough, + gradually worsening shortness of breath   Cardiac:   no chest pain or tightness, + exertional SOB, + resting SOB, no PND, no orthopnea, no LE edema, no palpitations, no syncope  GI:   no difficulty swallowing, no hematochezia, no hematemesis, no melena, no constipation, no diarrhea   GU:   no dysuria, no urgency, no frequency   Musculoskeletal: no arthritis, no arthralgia   Vascular:  no pain suggestive of claudication   Neuro:   no symptoms suggestive of TIA's, no seizures, no headaches, no peripheral neuropathy, chronic mild weakness and instability of gait related to MS  Endocrine:  Negative   HEENT:  no loose teeth or painful teeth,  no recent vision changes  Psych:   no anxiety, no depression    Physical Exam:   BP 125/44  Pulse 94  Temp 97.4 F (36.3 C) (Oral)  Resp 16  SpO2 97%  General:  Thin,  well-appearing  HEENT:  Unremarkable   Neck:   no JVD, no bruits, no adenopathy   Chest:   Markedly diminished breath sounds on right side, no wheezes, no rhonchi, minimal pain on palpation of right chest wall  CV:   RRR, no murmur   Abdomen:  soft, non-tender, no masses   Extremities:  warm, well-perfused, pulses palpable  Rectal/GU  Deferred  Neuro:   Grossly non-focal and symmetrical throughout  Skin:   Clean and dry, no rashes, no breakdown  Diagnostic Tests:  *RADIOLOGY REPORT*   Clinical Data: Fall with known right eighth rib fracture and hemothorax.  Increasing shortness of breath and chills.   CT CHEST WITHOUT CONTRAST   Technique:  Multidetector CT imaging of the chest was  performed following the standard protocol without IV contrast.   Comparison: Chest x-ray dated 12/12/2012.   Findings: There is a massive right pleural effusion which is nearly drowning the entire right lung.  Some aerated upper lobe remains. The effusion shows areas of mildly increased internal density and findings are consistent with hemothorax and potentially also a component of a reactive effusion.  Associated rib fractures are seen involving the right eighth, ninth, tenth and eleventh ribs. Some of these fractures show partial healing.  All of the fractures show mild displacement.  No associated component of pneumothorax or air within the pleural effusion.   There is no significant shift of mediastinal structures.  No pericardial effusion is identified.  The trachea is open and  in the midline.  No vertebral fractures are identified.   IMPRESSION: Fractures of the right 8th through 11th ribs with associated massive right pleural effusion causing near drowning of the entire right lung.  Findings are consistent with hemothorax/reactive effusion.     Original Report Authenticated By: Irish Lack, M.D.     Impression:  The patient has nondisplaced rib fractures involving the right 8th through 11th ribs laterally with a very large right pleural effusion.  I have personally reviewed the CT scan, and the pleural effusion appears simple and free of any obvious signs of loculations or hematoma. Clinically this makes sense given the gradual enlargement of the pleural fluid collection over more than a month's time.  There do not appear to be any other complicating features and the isolated non-displaced rib fractures show some early signs of healing.  The pleural effusion clearly needs to be drained.  Options include thoracentesis with or without ultrasound guidance, chest tube placement, or surgical drainage using video thoracoscopy.   Plan:  I discussed options at length with the  patient and his wife here in the emergency department. They are anxious to get something done promptly. We will proceed with thoracentesis at the bedside. They understand that there is a possibility this will not adequately drain all of the pleural effusion, but I'm hopeful that may be all that is necessary. They understand and accept all potential associated risks including but not limited to risk of bleeding, pneumothorax, or inadequate drainage of the existing pleural effusion. All their questions been addressed. We will plan to observe the patient in the hospital at least overnight to make further plans depending upon followup chest x-ray following thoracentesis.    Salvatore Decent. Cornelius Moras, MD

## 2012-12-31 NOTE — Op Note (Signed)
CARDIOTHORACIC SURGERY OPERATIVE NOTE  Date of Procedure:  12/31/2012  Preoperative Diagnosis: Right Pleural Effusion  Postoperative Diagnosis: Same  Procedure:   Right Needle Thoracentesis  Surgeon:   Salvatore Decent. Cornelius Moras, MD  Anesthesia:   1% lidocaine local    DETAILS OF THE OPERATIVE PROCEDURE  Following full informed consent the patient was placed in the upright sitting position.  The right posterior chest was prepared and draped in a sterile fashion.  1% lidocaine local anesthetic was administered in the mid scapular line overlying the right 9th rib.  Needle thoracentesis was performed using a thoracentesis kit.  A total of 250 mL of bloody fluid was evacuated when the catheter seemed to become occluded.  The procedure was tolerated well, but it is presumed that there is potentially loculations and/or significant clot that precludes evacuation of additional fluid.  Will attempt U/S guided thoracentesis in the morning, but VATS may prove to be necessary.  Will keep NPO after MN tonight.   Salvatore Decent. Cornelius Moras, MD 12/31/2012 8:14 PM

## 2012-12-31 NOTE — ED Notes (Signed)
Pt in upright position on cardiac monitor with family at bedside, pt & family verbalized understanding of procedure, consent obtained, pt tolerated procedure well, 300 mL serosangenous fluid drained, cultures sent, vitals WNL during procedure

## 2012-12-31 NOTE — ED Notes (Signed)
Cornelius Moras, MD at bedside, pt & family updated on plan of care

## 2012-12-31 NOTE — Telephone Encounter (Signed)
Caller: Martha/Spouse; PCP: Eleonore Chiquito (Family Practice); CB#: [phone];  Patient is being admitted to hospital 1-23 and wife is wanting Dr. Tawanna Cooler or Dr. Lovell Sheehan follow patient as patient's PCP is leaving country 1-24. Wife wants MD's to follow care while in hospital. Patient is at ED at time of call. Wife would like to have this message acknowledged. [phone]

## 2013-01-01 ENCOUNTER — Inpatient Hospital Stay: Admission: RE | Admit: 2013-01-01 | Payer: BC Managed Care – PPO | Source: Ambulatory Visit

## 2013-01-01 ENCOUNTER — Observation Stay (HOSPITAL_COMMUNITY): Payer: BC Managed Care – PPO

## 2013-01-01 ENCOUNTER — Encounter (HOSPITAL_COMMUNITY): Payer: Self-pay | Admitting: Certified Registered"

## 2013-01-01 ENCOUNTER — Encounter (HOSPITAL_COMMUNITY): Payer: Self-pay | Admitting: *Deleted

## 2013-01-01 ENCOUNTER — Encounter (HOSPITAL_COMMUNITY)
Admission: EM | Disposition: A | Discharge status: Home / Self Care | Payer: Self-pay | Source: Home / Self Care | Attending: Thoracic Surgery (Cardiothoracic Vascular Surgery)

## 2013-01-01 ENCOUNTER — Observation Stay (HOSPITAL_COMMUNITY): Payer: BC Managed Care – PPO | Admitting: Certified Registered"

## 2013-01-01 ENCOUNTER — Telehealth: Payer: Self-pay | Admitting: Internal Medicine

## 2013-01-01 DIAGNOSIS — J90 Pleural effusion, not elsewhere classified: Secondary | ICD-10-CM

## 2013-01-01 HISTORY — PX: PLEURAL EFFUSION DRAINAGE: SHX5099

## 2013-01-01 LAB — CBC
HCT: 33.2 % — ABNORMAL LOW (ref 39.0–52.0)
MCH: 28.4 pg (ref 26.0–34.0)
MCV: 88.1 fL (ref 78.0–100.0)
Platelets: 404 10*3/uL — ABNORMAL HIGH (ref 150–400)
RBC: 3.77 MIL/uL — ABNORMAL LOW (ref 4.22–5.81)
WBC: 8.7 10*3/uL (ref 4.0–10.5)

## 2013-01-01 LAB — ABO/RH: ABO/RH(D): A POS

## 2013-01-01 LAB — TYPE AND SCREEN
ABO/RH(D): A POS
Antibody Screen: NEGATIVE

## 2013-01-01 LAB — MRSA PCR SCREENING: MRSA by PCR: NEGATIVE

## 2013-01-01 LAB — PROTIME-INR: INR: 1.18 (ref 0.00–1.49)

## 2013-01-01 LAB — PATHOLOGIST SMEAR REVIEW

## 2013-01-01 SURGERY — DRAINAGE, PLEURAL EFFUSION
Anesthesia: General | Site: Chest | Laterality: Right | Wound class: Clean Contaminated

## 2013-01-01 MED ORDER — OXYCODONE HCL 5 MG PO TABS: 5.0000 mg | ORAL_TABLET | Freq: Once | ORAL | Status: DC | PRN

## 2013-01-01 MED ORDER — BISACODYL 5 MG PO TBEC
10.0000 mg | DELAYED_RELEASE_TABLET | Freq: Every day | ORAL | Status: DC
  Administered 2013-01-02 – 2013-01-05 (×3): 10 mg via ORAL
  Filled 2013-01-01 (×2): qty 2
  Filled 2013-01-01: qty 1
  Filled 2013-01-01: qty 2

## 2013-01-01 MED ORDER — ACETAMINOPHEN 10 MG/ML IV SOLN
1000.0000 mg | Freq: Four times a day (QID) | INTRAVENOUS | Status: AC
  Administered 2013-01-01 – 2013-01-02 (×3): 1000 mg via INTRAVENOUS
  Filled 2013-01-01 (×6): qty 100

## 2013-01-01 MED ORDER — NEOSTIGMINE METHYLSULFATE 1 MG/ML IJ SOLN
INTRAMUSCULAR | Status: DC | PRN
  Administered 2013-01-01: 4 mg via INTRAVENOUS
  Administered 2013-01-01: 1 mg via INTRAVENOUS

## 2013-01-01 MED ORDER — GLYCOPYRROLATE 0.2 MG/ML IJ SOLN
INTRAMUSCULAR | Status: DC | PRN
  Administered 2013-01-01: 0.2 mg via INTRAVENOUS
  Administered 2013-01-01: 0.6 mg via INTRAVENOUS

## 2013-01-01 MED ORDER — FENTANYL CITRATE 0.05 MG/ML IJ SOLN
25.0000 ug | INTRAMUSCULAR | Status: DC | PRN
  Administered 2013-01-02 (×2): 50 ug via INTRAVENOUS
  Filled 2013-01-01 (×2): qty 2

## 2013-01-01 MED ORDER — BUPIVACAINE HCL (PF) 0.25 % IJ SOLN
INTRAMUSCULAR | Status: AC
  Filled 2013-01-01: qty 30

## 2013-01-01 MED ORDER — PROPOFOL 10 MG/ML IV BOLUS
INTRAVENOUS | Status: DC | PRN
  Administered 2013-01-01: 160 mg via INTRAVENOUS

## 2013-01-01 MED ORDER — LACTATED RINGERS IV SOLN
INTRAVENOUS | Status: DC
  Administered 2013-01-01: 13:00:00 via INTRAVENOUS

## 2013-01-01 MED ORDER — OXYCODONE HCL 5 MG PO TABS
5.0000 mg | ORAL_TABLET | ORAL | Status: AC | PRN
  Administered 2013-01-02: 10 mg via ORAL
  Filled 2013-01-01: qty 2

## 2013-01-01 MED ORDER — SODIUM CHLORIDE 0.9 % IR SOLN
Status: DC | PRN
  Administered 2013-01-01: 3000 mL

## 2013-01-01 MED ORDER — MEPERIDINE HCL 25 MG/ML IJ SOLN
INTRAMUSCULAR | Status: AC
  Administered 2013-01-01 (×2): 12.5 mg
  Filled 2013-01-01: qty 1

## 2013-01-01 MED ORDER — ACETAMINOPHEN 10 MG/ML IV SOLN
INTRAVENOUS | Status: AC
  Filled 2013-01-01: qty 100

## 2013-01-01 MED ORDER — ROCURONIUM BROMIDE 100 MG/10ML IV SOLN
INTRAVENOUS | Status: DC | PRN
  Administered 2013-01-01: 50 mg via INTRAVENOUS
  Administered 2013-01-01 (×2): 20 mg via INTRAVENOUS
  Administered 2013-01-01: 10 mg via INTRAVENOUS

## 2013-01-01 MED ORDER — DEXTROSE IN LACTATED RINGERS 5 % IV SOLN
INTRAVENOUS | Status: DC
  Administered 2013-01-01: 22:00:00 via INTRAVENOUS

## 2013-01-01 MED ORDER — HYDROMORPHONE HCL PF 1 MG/ML IJ SOLN
0.2500 mg | INTRAMUSCULAR | Status: DC | PRN
  Administered 2013-01-01: 0.5 mg via INTRAVENOUS

## 2013-01-01 MED ORDER — LACTATED RINGERS IV SOLN
INTRAVENOUS | Status: DC | PRN
  Administered 2013-01-01: 13:00:00 via INTRAVENOUS

## 2013-01-01 MED ORDER — FENTANYL CITRATE 0.05 MG/ML IJ SOLN
INTRAMUSCULAR | Status: DC | PRN
  Administered 2013-01-01 (×3): 50 ug via INTRAVENOUS
  Administered 2013-01-01: 150 ug via INTRAVENOUS
  Administered 2013-01-01: 50 ug via INTRAVENOUS

## 2013-01-01 MED ORDER — SENNOSIDES-DOCUSATE SODIUM 8.6-50 MG PO TABS
1.0000 | ORAL_TABLET | Freq: Every evening | ORAL | Status: DC | PRN
  Filled 2013-01-01: qty 1

## 2013-01-01 MED ORDER — DEXTROSE 5 % IV SOLN
1.5000 g | INTRAVENOUS | Status: AC
  Administered 2013-01-01: 1.5 g via INTRAVENOUS
  Filled 2013-01-01: qty 1.5

## 2013-01-01 MED ORDER — 0.9 % SODIUM CHLORIDE (POUR BTL) OPTIME
TOPICAL | Status: DC | PRN
  Administered 2013-01-01: 2000 mL

## 2013-01-01 MED ORDER — OXYCODONE-ACETAMINOPHEN 5-325 MG PO TABS
1.0000 | ORAL_TABLET | ORAL | Status: DC | PRN
  Administered 2013-01-02 – 2013-01-04 (×5): 2 via ORAL
  Filled 2013-01-01 (×5): qty 2

## 2013-01-01 MED ORDER — HYDROMORPHONE HCL PF 1 MG/ML IJ SOLN
INTRAMUSCULAR | Status: AC
  Filled 2013-01-01: qty 1

## 2013-01-01 MED ORDER — OXYCODONE HCL 5 MG/5ML PO SOLN: 5.0000 mg | Freq: Once | ORAL | Status: DC | PRN

## 2013-01-01 MED ORDER — ONDANSETRON HCL 4 MG/2ML IJ SOLN
INTRAMUSCULAR | Status: DC | PRN
  Administered 2013-01-01: 4 mg via INTRAVENOUS

## 2013-01-01 MED ORDER — MIDAZOLAM HCL 5 MG/5ML IJ SOLN
INTRAMUSCULAR | Status: DC | PRN
  Administered 2013-01-01: 2 mg via INTRAVENOUS

## 2013-01-01 MED ORDER — ONDANSETRON HCL 4 MG/2ML IJ SOLN: 4.0000 mg | Freq: Four times a day (QID) | INTRAMUSCULAR | Status: DC | PRN

## 2013-01-01 MED ORDER — POTASSIUM CHLORIDE 10 MEQ/50ML IV SOLN
10.0000 meq | Freq: Every day | INTRAVENOUS | Status: DC | PRN
  Administered 2013-01-02 (×3): 10 meq via INTRAVENOUS
  Filled 2013-01-01: qty 50
  Filled 2013-01-01: qty 150

## 2013-01-01 MED ORDER — BUPIVACAINE HCL 0.25 % IJ SOLN
INTRAMUSCULAR | Status: DC | PRN
  Administered 2013-01-01: 30 mL

## 2013-01-01 SURGICAL SUPPLY — 66 items
ADH SKN CLS APL DERMABOND .7 (GAUZE/BANDAGES/DRESSINGS) ×1
APL SRG 22X2 LUM MLBL SLNT (VASCULAR PRODUCTS)
APPLICATOR TIP EXT COSEAL (VASCULAR PRODUCTS) IMPLANT
APPLIER CLIP ROT 10 11.4 M/L (STAPLE)
APR CLP MED LRG 11.4X10 (STAPLE)
CANISTER SUCTION 2500CC (MISCELLANEOUS) ×2 IMPLANT
CATH KIT ON Q 5IN SLV (PAIN MANAGEMENT) IMPLANT
CATH THORACIC 28FR (CATHETERS) IMPLANT
CATH THORACIC 36FR (CATHETERS) IMPLANT
CATH THORACIC 36FR RT ANG (CATHETERS) ×1 IMPLANT
CLIP APPLIE ROT 10 11.4 M/L (STAPLE) IMPLANT
CLIP TI MEDIUM 6 (CLIP) ×2 IMPLANT
CLOTH BEACON ORANGE TIMEOUT ST (SAFETY) ×2 IMPLANT
CONT SPEC 4OZ CLIKSEAL STRL BL (MISCELLANEOUS) ×4 IMPLANT
COVER SURGICAL LIGHT HANDLE (MISCELLANEOUS) ×4 IMPLANT
DERMABOND ADVANCED (GAUZE/BANDAGES/DRESSINGS) ×1
DERMABOND ADVANCED .7 DNX12 (GAUZE/BANDAGES/DRESSINGS) IMPLANT
DRAPE LAPAROSCOPIC ABDOMINAL (DRAPES) ×2 IMPLANT
DRAPE WARM FLUID 44X44 (DRAPE) ×4 IMPLANT
DRILL BIT 7/64X5 (BIT) IMPLANT
ELECT REM PT RETURN 9FT ADLT (ELECTROSURGICAL) ×2
ELECTRODE REM PT RTRN 9FT ADLT (ELECTROSURGICAL) ×1 IMPLANT
FLUID NSS /IRRIG 3000 ML XXX (IV SOLUTION) ×2 IMPLANT
GLOVE BIOGEL PI IND STRL 6.5 (GLOVE) IMPLANT
GLOVE BIOGEL PI INDICATOR 6.5 (GLOVE) ×2
GLOVE ECLIPSE 6.5 STRL STRAW (GLOVE) ×1 IMPLANT
GLOVE EUDERMIC 7 POWDERFREE (GLOVE) ×4 IMPLANT
GLOVE ORTHO TXT STRL SZ7.5 (GLOVE) ×1 IMPLANT
GLOVE SS BIOGEL STRL SZ 6.5 (GLOVE) IMPLANT
GLOVE SUPERSENSE BIOGEL SZ 6.5 (GLOVE) ×1
GOWN PREVENTION PLUS XLARGE (GOWN DISPOSABLE) ×2 IMPLANT
GOWN STRL NON-REIN LRG LVL3 (GOWN DISPOSABLE) ×4 IMPLANT
KIT BASIN OR (CUSTOM PROCEDURE TRAY) ×2 IMPLANT
KIT ROOM TURNOVER OR (KITS) ×2 IMPLANT
NS IRRIG 1000ML POUR BTL (IV SOLUTION) ×4 IMPLANT
PACK CHEST (CUSTOM PROCEDURE TRAY) ×2 IMPLANT
PAD ARMBOARD 7.5X6 YLW CONV (MISCELLANEOUS) ×4 IMPLANT
SEALANT PROGEL (MISCELLANEOUS) IMPLANT
SEALANT SURG COSEAL 4ML (VASCULAR PRODUCTS) IMPLANT
SEALANT SURG COSEAL 8ML (VASCULAR PRODUCTS) IMPLANT
SET IRRIG TUBING LAPAROSCOPIC (IRRIGATION / IRRIGATOR) ×2 IMPLANT
SOLUTION ANTI FOG 6CC (MISCELLANEOUS) ×2 IMPLANT
SPONGE GAUZE 4X4 12PLY (GAUZE/BANDAGES/DRESSINGS) ×2 IMPLANT
SUT MNCRL AB 4-0 PS2 18 (SUTURE) ×1 IMPLANT
SUT PROLENE 3 0 SH DA (SUTURE) IMPLANT
SUT PROLENE 4 0 RB 1 (SUTURE)
SUT PROLENE 4-0 RB1 .5 CRCL 36 (SUTURE) IMPLANT
SUT SILK  1 MH (SUTURE) ×2
SUT SILK 1 MH (SUTURE) ×2 IMPLANT
SUT SILK 2 0SH CR/8 30 (SUTURE) IMPLANT
SUT VIC AB 1 CTX 18 (SUTURE) IMPLANT
SUT VIC AB 1 CTX 36 (SUTURE)
SUT VIC AB 1 CTX36XBRD ANBCTR (SUTURE) IMPLANT
SUT VIC AB 2-0 CTX 36 (SUTURE) IMPLANT
SUT VIC AB 3-0 SH 27 (SUTURE) ×6
SUT VIC AB 3-0 SH 27X BRD (SUTURE) IMPLANT
SUT VIC AB 3-0 X1 27 (SUTURE) ×2 IMPLANT
SUT VICRYL 2 TP 1 (SUTURE) IMPLANT
SYSTEM SAHARA CHEST DRAIN RE-I (WOUND CARE) ×2 IMPLANT
TAPE CLOTH 4X10 WHT NS (GAUZE/BANDAGES/DRESSINGS) ×2 IMPLANT
TAPE CLOTH SURG 6X10 WHT LF (GAUZE/BANDAGES/DRESSINGS) ×1 IMPLANT
TIP APPLICATOR SPRAY EXTEND 16 (VASCULAR PRODUCTS) IMPLANT
TOWEL OR 17X24 6PK STRL BLUE (TOWEL DISPOSABLE) ×4 IMPLANT
TOWEL OR 17X26 10 PK STRL BLUE (TOWEL DISPOSABLE) ×4 IMPLANT
TRAY FOLEY CATH 14FRSI W/METER (CATHETERS) ×2 IMPLANT
WATER STERILE IRR 1000ML POUR (IV SOLUTION) ×4 IMPLANT

## 2013-01-01 NOTE — Anesthesia Preprocedure Evaluation (Signed)
Anesthesia Evaluation  Patient identified by MRN, date of birth, ID band Patient awake    Reviewed: Allergy & Precautions, H&P , NPO status , Patient's Chart, lab work & pertinent test results  Airway Mallampati: II TM Distance: >3 FB Neck ROM: Full    Dental No notable dental hx. (+) Teeth Intact and Dental Advisory Given   Pulmonary neg pulmonary ROS, shortness of breath,  breath sounds clear to auscultation  Pulmonary exam normal       Cardiovascular negative cardio ROS  Rhythm:Regular Rate:Normal     Neuro/Psych PSYCHIATRIC DISORDERS Multiple sclerosis    GI/Hepatic Neg liver ROS, GERD-  Medicated and Controlled,  Endo/Other  negative endocrine ROS  Renal/GU negative Renal ROS  negative genitourinary   Musculoskeletal   Abdominal   Peds  Hematology negative hematology ROS (+)   Anesthesia Other Findings   Reproductive/Obstetrics negative OB ROS                           Anesthesia Physical Anesthesia Plan  ASA: III  Anesthesia Plan: General   Post-op Pain Management:    Induction: Intravenous  Airway Management Planned: Double Lumen EBT  Additional Equipment: Arterial line and CVP  Intra-op Plan:   Post-operative Plan: Extubation in OR  Informed Consent: I have reviewed the patients History and Physical, chart, labs and discussed the procedure including the risks, benefits and alternatives for the proposed anesthesia with the patient or authorized representative who has indicated his/her understanding and acceptance.   Dental advisory given  Plan Discussed with: CRNA  Anesthesia Plan Comments:         Anesthesia Quick Evaluation

## 2013-01-01 NOTE — Telephone Encounter (Signed)
Left message on machine for wife

## 2013-01-01 NOTE — Care Management Note (Unsigned)
    Page 1 of 1   01/01/2013     1:35:31 PM   CARE MANAGEMENT NOTE 01/01/2013  Patient:  [name], [name]   Account Number:  [id number]  Date Initiated:  01/01/2013  Documentation initiated by:  Tamerra Merkley  Subjective/Objective Assessment:   PT ADM WITH LARGE PLEURAL EFFUSION; VATS PENDING LATER TODAY.  PTA, PT INDEPENDENT, LIVES WITH SPOUSE.     Action/Plan:   WILL FOLLOW FOR HOME NEEDS AS PT PROGRESSES.   Anticipated DC Date:  01/05/2013   Anticipated DC Plan:  HOME W HOME HEALTH SERVICES      DC Planning Services  CM consult      Choice offered to / List presented to:             Status of service:  In process, will continue to follow Medicare Important Message given?   (If response is "NO", the following Medicare IM given date fields will be blank) Date Medicare IM given:   Date Additional Medicare IM given:    Discharge Disposition:    Per UR Regulation:  Reviewed for med. necessity/level of care/duration of stay  If discussed at Long Length of Stay Meetings, dates discussed:    Comments:

## 2013-01-01 NOTE — Progress Notes (Signed)
TCTS BRIEF PROGRESS NOTE  Day of Surgery  S/P Procedure(s) (LRB): DRAINAGE OF PLEURAL EFFUSION (Right)   Doing well in PACU Minimal pain Breathing comfortably w/ O2 sat 98% on 2 L/min Chest tube output low, no air leak RLL incompletely reexpanded on initial pCXR but overall x-ray looks good  Plan: Continue routine postop.  Monitor in ICU because of MS  [name] H 01/01/2013 5:46 PM

## 2013-01-01 NOTE — Transfer of Care (Signed)
Immediate Anesthesia Transfer of Care Note  Patient: [name]  Procedure(s) Performed: Procedure(s) (LRB) with comments: DRAINAGE OF PLEURAL EFFUSION (Right)  Patient Location: PACU  Anesthesia Type:General  Level of Consciousness: awake, oriented, lethargic and responds to stimulation  Airway & Oxygen Therapy: Patient Spontanous Breathing and Patient connected to nasal cannula oxygen  Post-op Assessment: Report given to PACU RN and Patient moving all extremities X 4  Post vital signs: Reviewed and stable  Complications: No apparent anesthesia complications

## 2013-01-01 NOTE — Anesthesia Postprocedure Evaluation (Signed)
  Anesthesia Post-op Note  Patient: [name]  Procedure(s) Performed: Procedure(s) (LRB) with comments: DRAINAGE OF PLEURAL EFFUSION (Right)  Patient Location: PACU  Anesthesia Type:General  Level of Consciousness: awake and alert   Airway and Oxygen Therapy: Patient Spontanous Breathing and Patient connected to nasal cannula oxygen  Post-op Pain: mild  Post-op Assessment: Post-op Vital signs reviewed, Patient's Cardiovascular Status Stable, Respiratory Function Stable, Patent Airway and No signs of Nausea or vomiting  Post-op Vital Signs: Reviewed and stable  Complications: No apparent anesthesia complications

## 2013-01-01 NOTE — Preoperative (Signed)
Beta Blockers   Reason not to administer Beta Blockers:Not Applicable 

## 2013-01-01 NOTE — Telephone Encounter (Signed)
[facility] Triage Call Report Triage Record Num: [id number] Operator: Albertine Grates Patient Name: [name] Call Date & Time: 12/31/2012 5:15:29PM Patient Phone: [phone] PCP: Gordy Savers Patient Gender: Male PCP Fax : [phone] Patient DOB: 05-16-1966 [facility] Name[facility] Reason for Call: Caller: Dara Hoyer; PCP: Eleonore Chiquito (Family Practice); CB#: [phone]; Patient is being admitted to hospital 1-23 and wife is wanting Dr. Tawanna Cooler or Dr. Lovell Sheehan follow patient as patient's PCP is leaving country 1-24. Wife wants MD's to follow care while in hospital. Patient is at ED at time of call. Message sent via EPIC. Protocol(s) Used: Office Note Recommended Outcome per Protocol: Information Noted and Sent to Office Reason for Outcome: Caller information to office Care Advice: ~ 01/23/

## 2013-01-01 NOTE — Op Note (Signed)
CARDIOTHORACIC SURGERY OPERATIVE NOTE  Date of Procedure:   01/01/2013  Preoperative Diagnosis:  Right Pleural Effusion, possible hemothorax  Postoperative Diagnosis:  same  Procedure:    Right Video Assisted Thoracoscopy for Drainage of pleural effusion  Surgeon:    Salvatore Decent. Cornelius Moras, MD  Assistant:    Lowella Dandy, PA-C  Anesthesia:    Zenon Mayo, MD  Operative Findings:   Large thin serosanguinous pleural effusion  Minimal loculations and old clot     BRIEF CLINICAL NOTE AND INDICATIONS FOR SURGERY  Oneal is a 47 year old male with history of multiple sclerosis who suffered a fall nearly 6 weeks ago resulting in nondisplaced rib fracture on the right side. Since then he has developed gradual progression of worsening shortness of breath. He originally presented several weeks ago when a chest x-ray was performed at urgent care documenting the presence of fracture of the 8th rib with an associated small pleural effusion.  He was seen in consultation on January 6 by Dr. Laneta Simmers who planned to see him again next week with a follow up chest x-ray.  However, over the last couple of weeks the Oneal has had continued worsening of shortness of breath associated with intermittent chills without fever. The Oneal has not had any significant pain he reports that he no longer has much pain related to the fall he sustained previously. His appetite remains poor and he has lost some weight. He denies any productive cough. He denies any pleuritic chest pain. He underwent a chest CT scan earlier today and was contacted directly by the radiologist who read the exam and recommended that the Oneal presents directly to the emergency room.  On initial review the CT scan it appeared that the pleural effusion may be quite simple and potentially amenable to needle thoracentesis. Needle thoracentesis was performed in the emergency department but only 250 mL of fluid was evacuated before the  thoracentesis catheter clotted off. The Oneal subsequently underwent attempted ultrasound guided thoracentesis earlier this morning by interventional radiology. That procedure was completely unsuccessful and it was reported that there were large loculations present in the pleural space that precluded adequate drainage of the pleural effusion. The Oneal was counseled regarding alternatives and now is brought to the operating room for definitive surgical intervention.      DETAILS OF THE OPERATIVE PROCEDURE  The Oneal is brought to the operating room on the above mentioned date.  The Oneal is placed in the supine position on the operative table. General endotracheal anesthesia is induced uneventfully. A Foley catheter is placed. Pneumatic sequential compression boots are placed on both lower legs. Intravenous antibiotic are administered. The Oneal is turned to the left lateral decubitus position. The Oneal's right chest prepared and draped in a sterile manner. Single lung ventilation was begun.  A time out procedure is performed. The Oneal's right chest was entered through a small incision made in the anterior axillary line over approximately the seventh intercostal space. The incision was completed through the subcutaneous tissues and intercostal muscles with electrocautery. The right pleural space is entered bluntly. The suction catheter is passed through the incision and approximately 3 liters of old serosanguineous fluid is evacuated. A portion of the fluid is trapped to be sent for routine culture and another portion trapped and sent for cytology. The 10 mm port is passed through the incision. The thoracoscopic camera passed through the port and the right chest was explored visually. The lung was completely collapsed. There is minimal residual  fluid. There are trivial loculations. There is one small piece of clot adherent to the visceral pleura. A second incision is placed posteriorly and  through this incision the remainder of the right testis carefully explored. The single portion of clot was removed and sent for routine histology. The right pleural space is irrigated using 2 L of warm saline solution. The right pleural space is drained using a single 36 French chest tube placed through the original port incision. The remaining posterior incision is closed in layers. The chest tube was fixed to close suction drainage device. The Oneal tolerated the procedure well, is x-ray in the operating room, and transported to the postanesthesia care unit in stable condition. There no intraoperative complications. Estimated blood loss was trivial.     Salvatore Decent. Cornelius Moras MD 01/01/2013 3:26 PM

## 2013-01-01 NOTE — Brief Op Note (Addendum)
12/31/2012 - 01/01/2013  3:16 PM  PATIENT:  [name]  47 y.o. male  PRE-OPERATIVE DIAGNOSIS:  right pleural effusion and hemothorax  POST-OPERATIVE DIAGNOSIS:  Same  PROCEDURE:  Procedure(s) (LRB) with comments:  VIDEO ASSISTED THORACOSCOPY (right) -Drainage of Hemothorax -Insertion of 36 F Right Angle chest tube  SURGEON:  Surgeon(s) and Role:    * Purcell Nails, MD - Primary  PHYSICIAN ASSISTANT: Lowella Dandy PA-C  ANESTHESIA:   general  EBL:  Total I/O In: 200 [I.V.:200] Out: 3450 [Other:3450]  BLOOD ADMINISTERED:none  DRAINS: 30 F Right Angle Chest tube Right Chest   LOCAL MEDICATIONS USED:  MARCAINE     SPECIMEN:  Source of Specimen:  Right Pleural Fluid, Right Pleural peel  DISPOSITION OF SPECIMEN:  Pathology, Cytology, Microbiologt  COUNTS:  YES  DICTATION: .Dragon Dictation  PLAN OF CARE: Admit to inpatient   PATIENT DISPOSITION:  PACU - hemodynamically stable.   Delay start of Pharmacological VTE agent (>24hrs) due to surgical blood loss or risk of bleeding: yes  Aaleigha Bozza H 01/01/2013 3:21 PM

## 2013-01-01 NOTE — Progress Notes (Signed)
TCTS BRIEF PROGRESS NOTE   Ultrasound guided thoracentesis was unsuccessful due to clot and loculations in the pleural space.  Will proceed to the OR for VATS and possible thoracotomy.  The patient understands and accepts all associated risks of surgery and desires to proceed.  OWEN,CLARENCE H 01/01/2013

## 2013-01-01 NOTE — Procedures (Signed)
US imaging demonstrates a large minimally complex right sided pleural effusion, however US guided thoracentesis only yielded 150 of bloody pleural fluid prior to catheter occlusion.  No immediate complications.

## 2013-01-02 ENCOUNTER — Inpatient Hospital Stay (HOSPITAL_COMMUNITY): Payer: BC Managed Care – PPO

## 2013-01-02 LAB — BASIC METABOLIC PANEL
BUN: 11 mg/dL (ref 6–23)
Chloride: 97 mEq/L (ref 96–112)
GFR calc Af Amer: >90 mL/min (ref >90)
Potassium: 3 mEq/L — ABNORMAL LOW (ref 3.5–5.1)

## 2013-01-02 LAB — POCT I-STAT 3, ART BLOOD GAS (G3+)
Bicarbonate: 29.7 mEq/L — ABNORMAL HIGH (ref 20.0–24.0)
pCO2 arterial: 40.8 mmHg (ref 35.0–45.0)
pH, Arterial: 7.469 — ABNORMAL HIGH (ref 7.350–7.450)
pO2, Arterial: 72 mmHg — ABNORMAL LOW (ref 80.0–100.0)

## 2013-01-02 LAB — CBC
HCT: 34.8 % — ABNORMAL LOW (ref 39.0–52.0)
RDW: 13.2 % (ref 11.5–15.5)
WBC: 10.4 10*3/uL (ref 4.0–10.5)

## 2013-01-02 MED ORDER — ALBUMIN HUMAN 5 % IV SOLN: 12.5000 g | Freq: Once | INTRAVENOUS | Status: AC

## 2013-01-02 MED ORDER — ENOXAPARIN SODIUM 30 MG/0.3ML ~~LOC~~ SOLN
40.0000 mg | SUBCUTANEOUS | Status: DC
  Filled 2013-01-02: qty 0.4

## 2013-01-02 MED ORDER — ALBUMIN HUMAN 5 % IV SOLN
INTRAVENOUS | Status: AC
  Administered 2013-01-02: 02:00:00
  Filled 2013-01-02: qty 250

## 2013-01-02 MED ORDER — ZOLPIDEM TARTRATE 5 MG PO TABS: 5.0000 mg | ORAL_TABLET | Freq: Every evening | ORAL | Status: DC | PRN

## 2013-01-02 MED ORDER — TRAZODONE HCL 100 MG PO TABS
100.0000 mg | ORAL_TABLET | ORAL | Status: DC
  Administered 2013-01-03: 100 mg via ORAL
  Filled 2013-01-02 (×2): qty 1

## 2013-01-02 MED ORDER — FUROSEMIDE 10 MG/ML IJ SOLN
20.0000 mg | Freq: Once | INTRAMUSCULAR | Status: AC
  Administered 2013-01-02: 20 mg via INTRAVENOUS
  Filled 2013-01-02: qty 2

## 2013-01-02 MED ORDER — TRAZODONE HCL 50 MG PO TABS
50.0000 mg | ORAL_TABLET | ORAL | Status: DC
  Administered 2013-01-02 – 2013-01-04 (×2): 50 mg via ORAL
  Filled 2013-01-02 (×2): qty 1

## 2013-01-02 MED ORDER — TRAMADOL HCL 50 MG PO TABS: 50.0000 mg | ORAL_TABLET | Freq: Four times a day (QID) | ORAL | Status: DC | PRN

## 2013-01-02 MED ORDER — FUROSEMIDE 40 MG PO TABS
40.0000 mg | ORAL_TABLET | Freq: Every day | ORAL | Status: DC
  Administered 2013-01-02 – 2013-01-05 (×4): 40 mg via ORAL
  Filled 2013-01-02 (×4): qty 1

## 2013-01-02 MED ORDER — ZOLPIDEM TARTRATE 5 MG PO TABS
5.0000 mg | ORAL_TABLET | Freq: Every evening | ORAL | Status: DC | PRN
  Administered 2013-01-02 – 2013-01-04 (×3): 5 mg via ORAL
  Filled 2013-01-02 (×3): qty 1

## 2013-01-02 MED ORDER — ENOXAPARIN SODIUM 40 MG/0.4ML ~~LOC~~ SOLN
40.0000 mg | SUBCUTANEOUS | Status: DC
  Administered 2013-01-02 – 2013-01-04 (×3): 40 mg via SUBCUTANEOUS
  Filled 2013-01-02 (×4): qty 0.4

## 2013-01-02 MED ORDER — POTASSIUM CHLORIDE CRYS ER 20 MEQ PO TBCR
20.0000 meq | EXTENDED_RELEASE_TABLET | Freq: Every day | ORAL | Status: DC
  Administered 2013-01-02 – 2013-01-05 (×4): 20 meq via ORAL
  Filled 2013-01-02 (×4): qty 1

## 2013-01-02 MED ORDER — INTERFERON BETA-1A 44 MCG/0.5ML ~~LOC~~ SOLN
44.0000 ug | SUBCUTANEOUS | Status: DC
  Administered 2013-01-02 – 2013-01-04 (×2): 44 ug via SUBCUTANEOUS

## 2013-01-02 NOTE — Progress Notes (Signed)
Discussed with Dr. Donata Clay pt decreased urine output.  Received orders for Albumin IV x1 dose and 20mg  Lasix IV.  Will complete orders and continue to monitor.

## 2013-01-02 NOTE — Progress Notes (Addendum)
1 Day Post-Op Procedure(s) (LRB): DRAINAGE OF PLEURAL EFFUSION (Right) Subjective: R VATS effusion resp functon good  Objective: Vital signs in last 24 hours: Temp:  [96.9 F (36.1 C)-98.6 F (37 C)] 98.2 F (36.8 C) (01/25 0748) Pulse Rate:  [72-110] 80  (01/25 1200) Cardiac Rhythm:  [-] Normal sinus rhythm (01/25 0800) Resp:  [12-30] 23  (01/25 1100) BP: (91-132)/(48-104) 101/67 mmHg (01/25 1200) SpO2:  [85 %-100 %] 99 % (01/25 1200) Arterial Line BP: (83-169)/(51-85) 94/71 mmHg (01/25 0900) Weight:  [159 lb 9.8 oz (72.4 kg)] 159 lb 9.8 oz (72.4 kg) (01/24 2000)  Hemodynamic parameters for last 24 hours:    Intake/Output from previous day: 01/24 0701 - 01/25 0700 In: 2470 [P.O.:720; I.V.:1300; IV Piggyback:450] Out: 8295 [AOZHY:8657; Chest Tube:150] Intake/Output this shift: Total I/O In: 645 [P.O.:120; I.V.:375; IV Piggyback:150] Out: 470 [Urine:320; Chest Tube:150]  no air leak, min CT drainage  Lab Results:  [facility] 01/02/13 0415 01/01/13 0551  WBC 10.4 8.7  HGB 11.3* 10.7*  HCT 34.8* 33.2*  PLT 336 404*   BMET:  Basename 01/02/13 0415 12/31/12 1716  NA 136 138  K 3.0* 3.7  CL 97 97  CO2 29 29  GLUCOSE 150* 124*  BUN 11 18  CREATININE 0.99 1.09  CALCIUM 8.4 9.3    PT/INR:  Basename 01/01/13 0551  LABPROT 14.8  INR 1.18   ABG    Component Value Date/Time   PHART 7.469* 01/02/2013 0423   HCO3 29.7* 01/02/2013 0423   TCO2 31 01/02/2013 0423   O2SAT 95.0 01/02/2013 0423   CBG (last 3)  No results found for this basename: GLUCAP:3 in the last 72 hours  Assessment/Plan: S/P Procedure(s) (LRB): DRAINAGE OF PLEURAL EFFUSION (Right) See progression orders Pleural fluid neg- stop antibiotics  LOS: 2 days    VAN TRIGT III,PETER 01/02/2013

## 2013-01-03 ENCOUNTER — Inpatient Hospital Stay (HOSPITAL_COMMUNITY): Payer: BC Managed Care – PPO

## 2013-01-03 LAB — COMPREHENSIVE METABOLIC PANEL
Albumin: 2.2 g/dL — ABNORMAL LOW (ref 3.5–5.2)
BUN: 11 mg/dL (ref 6–23)
Calcium: 8.7 mg/dL (ref 8.4–10.5)
Creatinine, Ser: 1.18 mg/dL (ref 0.50–1.35)
Total Protein: 6 g/dL (ref 6.0–8.3)

## 2013-01-03 LAB — BODY FLUID CULTURE
Culture: NO GROWTH
Gram Stain: NONE SEEN

## 2013-01-03 LAB — CBC
HCT: 35.7 % — ABNORMAL LOW (ref 39.0–52.0)
MCHC: 31.4 g/dL (ref 30.0–36.0)
MCV: 89.5 fL (ref 78.0–100.0)
Platelets: 294 10*3/uL (ref 150–400)
RDW: 13.3 % (ref 11.5–15.5)

## 2013-01-03 NOTE — Progress Notes (Signed)
Patient stated that he did not feel like walking this evening. Will continue to monitor.

## 2013-01-03 NOTE — Progress Notes (Signed)
Pt ambulated in hallway x1 assist  With walker. 550 feet, pt tolerated well, back in room call bell with in reach. Will continue to monitor patient. Hermione Havlicek, Randall An rN

## 2013-01-03 NOTE — Progress Notes (Signed)
Encouraged patient to get out of bed this am, to the chair. Pt stated "he did not want to do that at this time b/c his feet would get cold".  Will continue to encourage and explain the importance of getting OOB. Will monitor patient. Lanise Mergen, Randall An rN

## 2013-01-03 NOTE — Progress Notes (Addendum)
                   [address]            [name] [address]          [phone]    2 Days Post-Op Procedure(s) (LRB): DRAINAGE OF PLEURAL EFFUSION (Right)  Subjective: Some soreness at right chest tube site. Does not like heart healthy diet.  Objective: Vital signs in last 24 hours: Temp:  [100.4 F (38 C)] 100.4 F (38 C) (01/26 0436) Pulse Rate:  [80-109] 109  (01/26 0436) Cardiac Rhythm:  [-] Normal sinus rhythm (01/26 0816) Resp:  [18-24] 18  (01/26 0436) BP: (96-121)/(58-76) 102/63 mmHg (01/26 0436) SpO2:  [93 %-100 %] 93 % (01/26 0436)     Intake/Output from previous day: 01/25 0701 - 01/26 0700 In: 765 [P.O.:240; I.V.:375; IV Piggyback:150] Out: 1250 [Urine:1050; Chest Tube:200]   Physical Exam:  Cardiovascular: RRR Pulmonary: Clear on left and slightly decreased right base; no rales, wheezes, or rhonchi. Abdomen: Soft, non tender, bowel sounds present. Extremities: Mild bilateral lower extremity edema. Wounds: Clean and dry.  No erythema or signs of infection. Chest Tube: on suction, no air leak  Lab Results: CBC: Basename 01/03/13 0655 01/02/13 0415  WBC 8.6 10.4  HGB 11.2* 11.3*  HCT 35.7* 34.8*  PLT 294 336   BMET:  Basename 01/03/13 0655 01/02/13 0415  NA 138 136  K 4.3 3.0*  CL 98 97  CO2 32 29  GLUCOSE 131* 150*  BUN 11 11  CREATININE 1.18 0.99  CALCIUM 8.7 8.4    PT/INR:  Basename 01/01/13 0551  LABPROT 14.8  INR 1.18   ABG:  INR: Will add last result for INR, ABG once components are confirmed Will add last 4 CBG results once components are confirmed  Assessment/Plan:  1. CV - SR 2.  Pulmonary - Chest tube has had 200 cc of output last 24 hours. No air leak. Likely place to water seal.CXR this am shows no pneumothorax, improved aeration at right base.Preliminary cultures of right pleural fluid show no organisms or growth. Pathology smear showed abundant RBCs (likely hemothorax) 3.H and H stable at 11.2 and  35.7 4.Hypokalemia resolved as potassium up to 4.3 5.Per patient request, change diet to regular diet  [name],[name] 01/03/2013,9:20 AM   Some remaining atelectasis at R base- c/w low grade fever PAL in AM  patient examined and medical record reviewed,agree with above note. VAN TRIGT III,PETER 01/03/2013

## 2013-01-04 ENCOUNTER — Inpatient Hospital Stay (HOSPITAL_COMMUNITY): Payer: BC Managed Care – PPO

## 2013-01-04 ENCOUNTER — Encounter (HOSPITAL_COMMUNITY): Payer: Self-pay | Admitting: Thoracic Surgery (Cardiothoracic Vascular Surgery)

## 2013-01-04 LAB — BODY FLUID CULTURE

## 2013-01-04 MED ORDER — INFLUENZA VIRUS VACC SPLIT PF IM SUSP
0.5000 mL | INTRAMUSCULAR | Status: AC
  Administered 2013-01-05: 0.5 mL via INTRAMUSCULAR
  Filled 2013-01-04: qty 0.5

## 2013-01-04 MED ORDER — FUROSEMIDE 40 MG PO TABS: 40.0000 mg | ORAL_TABLET | Freq: Every day | ORAL | Status: DC

## 2013-01-04 MED ORDER — OXYCODONE-ACETAMINOPHEN 5-325 MG PO TABS: 1.0000 | ORAL_TABLET | ORAL | Status: DC | PRN

## 2013-01-04 MED ORDER — PNEUMOCOCCAL VAC POLYVALENT 25 MCG/0.5ML IJ INJ
0.5000 mL | INJECTION | INTRAMUSCULAR | Status: DC
  Filled 2013-01-04: qty 0.5

## 2013-01-04 MED ORDER — POTASSIUM CHLORIDE CRYS ER 20 MEQ PO TBCR: 20.0000 meq | EXTENDED_RELEASE_TABLET | Freq: Every day | ORAL | Status: DC

## 2013-01-04 NOTE — Progress Notes (Signed)
   CARDIOTHORACIC SURGERY PROGRESS NOTE  3 Days Post-Op  S/P Procedure(s) (LRB): DRAINAGE OF PLEURAL EFFUSION (Right)  Subjective: Feels well  Objective: Vital signs in last 24 hours: Temp:  [97.6 F (36.4 C)-99 F (37.2 C)] 99 F (37.2 C) (01/27 0519) Pulse Rate:  [80-98] 98  (01/27 0519) Cardiac Rhythm:  [-] Normal sinus rhythm (01/27 0856) Resp:  [16] 16  (01/27 0519) BP: (108-115)/(49-68) 110/49 mmHg (01/27 0519) SpO2:  [97 %-99 %] 97 % (01/27 0519) Weight:  [73.6 kg (162 lb 4.1 oz)] 73.6 kg (162 lb 4.1 oz) (01/27 0519)  Physical Exam:  Rhythm:   sinus  Breath sounds: clear  Heart sounds:  RRR  Incisions:  Clean and dry  Abdomen:  soft  Extremities:  warm  Chest tube(s):  Minimal output   Intake/Output from previous day: 01/26 0701 - 01/27 0700 In: 233 [P.O.:233] Out: 995 [Urine:925; Chest Tube:70] Intake/Output this shift:    Lab Results:  Basename 01/03/13 0655 01/02/13 0415  WBC 8.6 10.4  HGB 11.2* 11.3*  HCT 35.7* 34.8*  PLT 294 336   BMET:  Basename 01/03/13 0655 01/02/13 0415  NA 138 136  K 4.3 3.0*  CL 98 97  CO2 32 29  GLUCOSE 131* 150*  BUN 11 11  CREATININE 1.18 0.99  CALCIUM 8.7 8.4    CBG (last 3)  No results found for this basename: GLUCAP:3 in the last 72 hours  CXR:  N/A  Assessment/Plan: S/P Procedure(s) (LRB): DRAINAGE OF PLEURAL EFFUSION (Right)  Doing well D/C chest tube D/C foley Possible d/c home tomorrow  Purcell Nails 01/04/2013 9:13 AM

## 2013-01-04 NOTE — Progress Notes (Signed)
Removed pt's chest tube per MD order. Tied sutures and placed occlusive dressing over site. Pt tolerated well. Removed pt's foley as well per MD order. Will continue to monitor.

## 2013-01-04 NOTE — Discharge Summary (Signed)
[address]            [place] [address]          [phone]      TORI CUPPS 05-12-1966 47 y.o. [id number]  12/31/2012   Purcell Nails, MD  Pleural effusion, right [511.9] Fracture of rib of right side [807.00] Trouble breathing, right flank pain sent by Dr. right pleural effusion and hemothorax  HPI: At time of admission Patient is a 47 year old male with history of multiple sclerosis who suffered a fall nearly 6 weeks ago resulting in nondisplaced rib fracture on the right side. Since then he has developed gradual progression of worsening shortness of breath. He originally presented several weeks ago when a chest x-ray was performed at urgent care documenting the presence of fracture of the 8th rib with an associated small pleural effusion. He was seen in consultation on January 6 by Dr. Laneta Simmers who planned to see him again next week with a follow up chest x-ray. However, over the last couple of weeks the patient has had continued worsening of shortness of breath associated with intermittent chills without fever. The patient has not had any significant pain he reports that he no longer has much pain related to the fall he sustained previously. His appetite remains poor and he has lost some weight. He denies any productive cough. He denies any pleuritic chest pain. He underwent a chest CT scan earlier today and was contacted directly by the radiologist who read the exam and recommended that the patient presents directly to the emergency room. The patient was seen by Dr. Cornelius Moras in the emergency department. He was felt to require admission for further evaluation and treatment to include ultrasound-guided thoracentesis and if this was unsuccessful,  video assisted thoracoscopy.  Past Medical History   Diagnosis  Date   .  ALLERGIC RHINITIS  03/27/2010   .  GERD  03/27/2010   .  HYPERLIPIDEMIA  03/27/2010   .  MULTIPLE SCLEROSIS, RELAPSING/REMITTING   03/27/2010   .  NICOTINE ADDICTION  05/24/2010   .  TMJ (dislocation of temporomandibular joint)    .  Insomnia    .  Neurogenic bladder    .  Rib fracture  11/2012    Past Surgical History   Procedure  Date   .  Ankle surgery    .  Foot surgery     Family History   Problem  Relation  Age of Onset   .  Cancer  Father       lung ca    Social History  History   Substance Use Topics   .  Smoking status:  Former Smoker -- 0.5 packs/day     Types:  Cigarettes     Start date:  10/16/2012   .  Smokeless tobacco:  Former Neurosurgeon     Quit date:  10/31/2012   .  Alcohol Use:  Yes      Comment: occasionally    Prior to Admission medications   Medication  Sig  Start Date  End Date  Taking?  Authorizing Provider   acetaminophen (TYLENOL) 650 MG CR tablet  Take 650 mg by mouth daily.    Yes  Historical Provider, MD   calcium carbonate 200 MG capsule  Take by mouth daily.    Yes  Historical Provider, MD   diphenhydramine-acetaminophen (TYLENOL PM) 25-500  MG TABS  Take 1 tablet by mouth at bedtime as needed. For sleep    Yes  Historical Provider, MD   donepezil (ARICEPT) 10 MG tablet  Take 10 mg by mouth daily.    Yes  Historical Provider, MD   Eszopiclone (ESZOPICLONE) 3 MG TABS  Take 3 mg by mouth See admin instructions. Takes on Sat, Monday and Wednesday    Yes  Historical Provider, MD   finasteride (PROPECIA) 1 MG tablet  Take 1 tablet (1 mg total) by mouth daily.  04/02/12   Yes  Gordy Savers, MD   gabapentin (NEURONTIN) 600 MG tablet  Take 600 mg by mouth 3 (three) times daily.  03/27/11   Yes  Gordy Savers, MD   ibuprofen (ADVIL,MOTRIN) 800 MG tablet  Take 800 mg by mouth at bedtime as needed.    Yes  Historical Provider, MD   interferon beta-1a (REBIF) 44 MCG/0.5ML injection  Inject 44 mcg into the skin See admin instructions. Takes on Saturday, Monday and wednesday    Yes  Historical Provider, MD   L-Methylfolate-B6-B12 (METANX) 3-35-2 MG TABS  Take 1 tablet by mouth 2 (two)  times daily.  03/27/11   Yes  Gordy Savers, MD   LORazepam (ATIVAN) 2 MG tablet  Take 2 mg by mouth daily.    Yes  Historical Provider, MD   nicotine polacrilex (COMMIT) 4 MG lozenge  Place 2 mg inside cheek as needed. For nicotine craving    Yes  Historical Provider, MD   omeprazole (PRILOSEC) 20 MG capsule  Take 20 mg by mouth daily.    Yes  Historical Provider, MD   traZODone (DESYREL) 50 MG tablet  Take 50-100 mg by mouth See admin instructions. Takes 1 tablet every night plus an additional tablet on Sunday, Tuesday, Thursday and Friday  09/03/11   Yes  Gordy Savers, MD   VIAGRA 100 MG tablet  Take 100 mg by mouth as needed. For sexual activity  01/17/12   Yes  Historical Provider, MD    Allergies   Allergen  Reactions   .  Amoxicillin  Swelling   .  Ampicillin  Swelling   .  Morphine  Swelling     Hospital Course: The patient underwent ultrasound-guided thoracentesis by Dr. Cornelius Moras. This was unsuccessful due to clot and loculations in the pleural space. Following that he was taken to the operating room where he underwent the following procedure:  Cornelius Moras, MD Physician Signed Cardiothoracic Surgery Op Note 01/01/2013 3:25 PM   CARDIOTHORACIC SURGERY OPERATIVE NOTE  Date of Procedure: 01/01/2013  Preoperative Diagnosis: Right Pleural Effusion, possible hemothorax  Postoperative Diagnosis: same  Procedure: Right Video Assisted Thoracoscopy for Drainage of pleural effusion  Surgeon: Salvatore Decent. Cornelius Moras, MD  Assistant: Lowella Dandy, PA-C  Anesthesia: Zenon Mayo, MD  Operative Findings:  Large thin serosanguinous pleural effusion  Minimal loculations and old clot   The patient tolerated the procedure well was taken to the post anesthesia care unit in stable condition. There were no intraoperative complications. Estimated blood loss was trivial the  Postoperative hospital course:  The patient has overall progressed nicely. He is tolerating gradually increasing activities.  All routine lines, monitors and drainage devices have been discontinued in the standard fashion. There continues to be some evidence of right-sided loculated fluid which is improved. He remains hemodynamically stable. He has had some low-grade fevers consistent with atelectasis. These are improving. Tentatively he is felt to be stable for discharge  in the next 24-48 hours pending ongoing observation of his recovery.     Basename 01/03/13 0655 01/02/13 0415  NA 138 136  K 4.3 3.0*  CL 98 97  CO2 32 29  GLUCOSE 131* 150*  BUN 11 11  CALCIUM 8.7 8.4    Basename 01/03/13 0655 01/02/13 0415  WBC 8.6 10.4  HGB 11.2* 11.3*  HCT 35.7* 34.8*  PLT 294 336   No results found for this basename: INR:2 in the last 72 hours   Discharge Instructions:  The patient is discharged to home with extensive instructions on wound care and progressive ambulation.  They are instructed not to drive or perform any heavy lifting until returning to see the physician in his office.  Discharge Diagnosis:  Pleural effusion, right [511.9] Fracture of rib of right side [807.00] Trouble breathing, right flank pain sent by Dr. right pleural effusion and hemothorax  Secondary Diagnosis: Patient Active Problem List  Diagnosis  . HYPERLIPIDEMIA  . NICOTINE ADDICTION  . MULTIPLE SCLEROSIS, RELAPSING/REMITTING  . ALLERGIC RHINITIS  . GERD  . Pleural effusion on right  . Fracture, ribs   Past Medical History  Diagnosis Date  . ALLERGIC RHINITIS 03/27/2010  . GERD 03/27/2010  . HYPERLIPIDEMIA 03/27/2010  . MULTIPLE SCLEROSIS, RELAPSING/REMITTING 03/27/2010  . NICOTINE ADDICTION 05/24/2010  . TMJ (dislocation of temporomandibular joint)   . Insomnia   . Neurogenic bladder   . Rib fracture 11/2012  . Pleural effusion on right 12/31/2012    Secondary to rib fractures  . Fracture, ribs 12/31/2012    Non-displaced fracture of right 8th-11th ribs       Rawson, [employer]  Home Medication Instructions [id number]    Printed on:01/04/13 1021  Medication Information                    donepezil (ARICEPT) 10 MG tablet Take 10 mg by mouth daily.             calcium carbonate 200 MG capsule Take by mouth daily.             Eszopiclone (ESZOPICLONE) 3 MG TABS Take 3 mg by mouth See admin instructions. Takes on Sat, Monday and Wednesday           omeprazole (PRILOSEC) 20 MG capsule Take 20 mg by mouth daily.             interferon beta-1a (REBIF) 44 MCG/0.5ML injection Inject 44 mcg into the skin See admin instructions. Takes on Saturday, Monday and wednesday           acetaminophen (TYLENOL) 650 MG CR tablet Take 650 mg by mouth daily.             diphenhydramine-acetaminophen (TYLENOL PM) 25-500 MG TABS Take 1 tablet by mouth at bedtime as needed. For sleep           L-Methylfolate-B6-B12 (METANX) 3-35-2 MG TABS Take 1 tablet by mouth 2 (two) times daily.           VIAGRA 100 MG tablet Take 100 mg by mouth as needed. For sexual activity           LORazepam (ATIVAN) 2 MG tablet Take 2 mg by mouth daily.           nicotine polacrilex (COMMIT) 4 MG lozenge Place 2 mg inside cheek as needed. For nicotine craving           finasteride (PROPECIA) 1 MG tablet Take 1 tablet (1 mg  total) by mouth daily.           gabapentin (NEURONTIN) 600 MG tablet Take 600 mg by mouth 3 (three) times daily.           traZODone (DESYREL) 50 MG tablet Take 50-100 mg by mouth See admin instructions. Takes 1 tablet every night plus an additional tablet on Sunday, Tuesday, Thursday and Friday           ibuprofen (ADVIL,MOTRIN) 800 MG tablet Take 800 mg by mouth at bedtime as needed.           furosemide (LASIX) 40 MG tablet Take 1 tablet (40 mg total) by mouth daily.           oxyCODONE-acetaminophen (PERCOCET/ROXICET) 5-325 MG per tablet Take 1-2 tablets by mouth every 4 (four) hours as needed for pain.           potassium chloride SA (K-DUR,KLOR-CON) 20 MEQ tablet Take 1 tablet (20 mEq total) by mouth  daily.             Follow-up Information    Follow up with Purcell Nails, MD. (01/25/2013 at 10:15 AM. Please obtain a chest x-ray at [facility] imaging at 9:15 AM. [facility] imaging is located in the same office complex as the surgeon.)    Contact information:   [address] [place] [address] [phone]          Disposition: For discharge home  Patient's condition is Harlin Heys, PA-C 01/04/2013  10:21 AM

## 2013-01-04 NOTE — Progress Notes (Signed)
Pt ambulated with standby assistance approx. 800 feet. Pt denies SOB and dizziness. Pt tolerated well. Will continue to monitor.

## 2013-01-05 ENCOUNTER — Inpatient Hospital Stay (HOSPITAL_COMMUNITY): Payer: BC Managed Care – PPO

## 2013-01-05 ENCOUNTER — Telehealth: Payer: Self-pay | Admitting: *Deleted

## 2013-01-05 ENCOUNTER — Encounter: Payer: BC Managed Care – PPO | Admitting: Surgery

## 2013-01-05 LAB — CBC WITH DIFFERENTIAL/PLATELET
Eosinophils Relative: 1 % (ref 0–5)
HCT: 33.9 % — ABNORMAL LOW (ref 39.0–52.0)
Hemoglobin: 11.1 g/dL — ABNORMAL LOW (ref 13.0–17.0)
Lymphocytes Relative: 14 % (ref 12–46)
Lymphs Abs: 1 10*3/uL (ref 0.7–4.0)
MCV: 88.1 fL (ref 78.0–100.0)
Monocytes Absolute: 1 10*3/uL (ref 0.1–1.0)
Monocytes Relative: 15 % — ABNORMAL HIGH (ref 3–12)
Platelets: 337 10*3/uL (ref 150–400)
RBC: 3.85 MIL/uL — ABNORMAL LOW (ref 4.22–5.81)
WBC: 6.8 10*3/uL (ref 4.0–10.5)

## 2013-01-05 LAB — URINALYSIS, ROUTINE W REFLEX MICROSCOPIC
Hgb urine dipstick: NEGATIVE
Protein, ur: NEGATIVE mg/dL
Urobilinogen, UA: 4 mg/dL — ABNORMAL HIGH (ref 0.0–1.0)

## 2013-01-05 NOTE — Progress Notes (Addendum)
[address]            [place] [address]          [phone]     4 Days Post-Op  Procedure(s) (LRB): DRAINAGE OF PLEURAL EFFUSION (Right) Subjective: Feels fine, no complaints, wants to go home  Objective  Telemetry sinus rhythm  Temp:  [97.3 F (36.3 C)-102.6 F (39.2 C)] 100.5 F (38.1 C) (01/28 0615) Pulse Rate:  [88-119] 119  (01/28 0408) Resp:  [16-18] 18  (01/28 0408) BP: (117-123)/(61-67) 117/61 mmHg (01/28 0408) SpO2:  [92 %-97 %] 92 % (01/28 0408)   Intake/Output Summary (Last 24 hours) at 01/05/13 0733 Last data filed at 01/04/13 1900  Gross per 24 hour  Intake    720 ml  Output      0 ml  Net    720 ml       General appearance: alert, cooperative, no distress and diaphoretic Heart: regular rate and rhythm Lungs: decreased right lower fields Abdomen: soft, nontender Extremities: no edema, no calf tenderness Wound: incisons healing well  Lab Results:  Basename 01/03/13 0655  NA 138  K 4.3  CL 98  CO2 32  GLUCOSE 131*  BUN 11  CREATININE 1.18  CALCIUM 8.7  MG --  PHOS --    Basename 01/03/13 0655  AST 33  ALT 24  ALKPHOS 76  BILITOT 0.5  PROT 6.0  ALBUMIN 2.2*   No results found for this basename: LIPASE:2,AMYLASE:2 in the last 72 hours  Basename 01/03/13 0655  WBC 8.6  NEUTROABS --  HGB 11.2*  HCT 35.7*  MCV 89.5  PLT 294   No results found for this basename: CKTOTAL:4,CKMB:4,TROPONINI:4 in the last 72 hours No components found with this basename: POCBNP:3 No results found for this basename: DDIMER in the last 72 hours No results found for this basename: HGBA1C in the last 72 hours No results found for this basename: CHOL,HDL,LDLCALC,TRIG,CHOLHDL in the last 72 hours No results found for this basename: TSH,T4TOTAL,FREET3,T3FREE,THYROIDAB in the last 72 hours No results found for this basename: VITAMINB12,FOLATE,FERRITIN,TIBC,IRON,RETICCTPCT in the last 72 hours  Medications: Scheduled    . bisacodyl  10 mg Oral Daily  . donepezil  10 mg Oral Daily  . enoxaparin (LOVENOX) injection  40 mg Subcutaneous Q24H  . finasteride  1 mg Oral Daily  . furosemide  40 mg Oral Daily  . gabapentin  600 mg Oral TID  . influenza  inactive virus vaccine  0.5 mL Intramuscular Tomorrow-1000  . interferon beta-1a  44 mcg Subcutaneous Custom  . nicotine  14 mg Transdermal Daily  . pantoprazole  40 mg Oral Daily  . pneumococcal 23 valent vaccine  0.5 mL Intramuscular Tomorrow-1000  . potassium chloride  20 mEq Oral Daily  . traZODone  100 mg Oral Custom  . traZODone  50 mg Oral Custom     Radiology/Studies:  Dg Chest 2 View  01/04/2013  *RADIOLOGY REPORT*  Clinical Data: Status post VATS  CHEST - 2 VIEW  Comparison: 01/03/2013.  Findings: The right subclavian catheter is stable.  The right-sided chest tube has been removed.  There is a persistent loculated basilar right-sided hydropneumothorax. The left lung is clear.  No left-sided pleural effusion.  IMPRESSION:  1.  Removal of right-sided chest tube with loculated right basilar hydropneumothorax. 2.  The left lung is clear.   Original Report Authenticated By:  Rudie Meyer, M.D.     INR: Will add last result for INR, ABG once components are confirmed Will add last 4 CBG results once components are confirmed  Assessment/Plan: S/P Procedure(s) (LRB): DRAINAGE OF PLEURAL EFFUSION (Right)  1 looks and feels ok, but + feverish/diaphoresis on exam. Will check WBC. CXR appears same. May need to consider ABX, push pulm toilet /increase activity as able. Has central line, but no other current peripheral.     LOS: 5 days    GOLD,WAYNE E 1/28/20147:33 AM    I have seen and examined the patient and agree with the assessment and plan as outlined.  Mr Eland reportedly had fever 102 degrees this morning.  Whereas this may just be due to residual atelectasis in right lung base I think we should check urine culture (foley left in place 3  days) remove his central line and check blood cultures.  The patient is insistent upon going home today.  I have convinced him to cooperate with the plan I've outlined for now.  OWEN,CLARENCE H 01/05/2013 8:36 AM

## 2013-01-05 NOTE — Progress Notes (Signed)
Central line D/Ced per MD order. Pt placed in trendelenburg position and occlusive dressing applied. Pt tolerated well. Pt on bedrest for 30 minutes. After D/Cing central line, saw order to culture tip of line however the line was already thrown away. Notified PA Wyona Almas of this.

## 2013-01-05 NOTE — Progress Notes (Signed)
Pt ambulated 1000 feet with standby assistance. Pt tolerated very well.

## 2013-01-05 NOTE — Telephone Encounter (Signed)
Pt aware of results 

## 2013-01-05 NOTE — Progress Notes (Signed)
Waiting to obtain sputum sample from pt. Pt states he has not phlegm to cough up at this time.

## 2013-01-05 NOTE — Progress Notes (Signed)
Discharge instructions given to pt and pt's family along with prescriptions. Addressed all concerns/questions. Pt is stable for discharge with his wife.

## 2013-01-06 LAB — URINE CULTURE: Colony Count: 10000

## 2013-01-11 LAB — CULTURE, BLOOD (ROUTINE X 2)

## 2013-01-12 LAB — CULTURE, BLOOD (ROUTINE X 2)

## 2013-01-14 ENCOUNTER — Other Ambulatory Visit: Payer: Self-pay | Admitting: *Deleted

## 2013-01-14 DIAGNOSIS — J90 Pleural effusion, not elsewhere classified: Secondary | ICD-10-CM

## 2013-01-18 ENCOUNTER — Ambulatory Visit (INDEPENDENT_AMBULATORY_CARE_PROVIDER_SITE_OTHER): Payer: Self-pay | Admitting: Physician Assistant

## 2013-01-18 ENCOUNTER — Ambulatory Visit
Admission: RE | Admit: 2013-01-18 | Discharge: 2013-01-18 | Disposition: A | Discharge status: Home / Self Care | Payer: BC Managed Care – PPO | Source: Ambulatory Visit | Attending: Thoracic Surgery (Cardiothoracic Vascular Surgery) | Admitting: Thoracic Surgery (Cardiothoracic Vascular Surgery)

## 2013-01-18 VITALS — BP 112/74 | HR 75 | Resp 16 | Ht 72.0 in | Wt 158.0 lb

## 2013-01-18 DIAGNOSIS — Z09 Encounter for follow-up examination after completed treatment for conditions other than malignant neoplasm: Secondary | ICD-10-CM

## 2013-01-18 DIAGNOSIS — J90 Pleural effusion, not elsewhere classified: Secondary | ICD-10-CM

## 2013-01-18 NOTE — Progress Notes (Signed)
[address]            [name] [address]          [phone]     HPI: Patient returns for routine postoperative follow-up having undergone a right VATS, drainage of pleural effusion on 01/01/2013 by Dr. Cornelius Moras .  The patient's postoperative course was notable for fevers, which were felt to be related to atelectasis.  He did have a urine culture prior to discharge home which was negative.  Since hospital discharge, the patient has done well.  He denies fevers, chills, shortness of breath, cough or chest pain.  He is not taking any pain medication at this point.  His slowly returning to his baseline physical activity.  He has no specific complaints today.    Current Outpatient Prescriptions  Medication Sig Dispense Refill  . acetaminophen (TYLENOL) 650 MG CR tablet Take 650 mg by mouth daily.        . calcium carbonate 200 MG capsule Take by mouth daily.        . diphenhydramine-acetaminophen (TYLENOL PM) 25-500 MG TABS Take 1 tablet by mouth at bedtime as needed. For sleep      . donepezil (ARICEPT) 10 MG tablet Take 10 mg by mouth daily.        . Eszopiclone (ESZOPICLONE) 3 MG TABS Take 3 mg by mouth See admin instructions. Takes on Sat, Monday and Wednesday      . finasteride (PROPECIA) 1 MG tablet Take 1 tablet (1 mg total) by mouth daily.  90 tablet  6  . gabapentin (NEURONTIN) 600 MG tablet Take 600 mg by mouth 3 (three) times daily.      Marland Kitchen ibuprofen (ADVIL,MOTRIN) 800 MG tablet Take 800 mg by mouth at bedtime as needed.      . interferon beta-1a (REBIF) 44 MCG/0.5ML injection Inject 44 mcg into the skin See admin instructions. Takes on Saturday, Monday and wednesday      . L-Methylfolate-B6-B12 (METANX) 3-35-2 MG TABS Take 1 tablet by mouth 2 (two) times daily.  180 tablet  1  . LORazepam (ATIVAN) 2 MG tablet Take 2 mg by mouth daily.      . nicotine polacrilex (COMMIT) 4 MG lozenge Place 2 mg inside cheek as needed. For nicotine craving      .  omeprazole (PRILOSEC) 20 MG capsule Take 20 mg by mouth daily.        . traZODone (DESYREL) 50 MG tablet Take 50-100 mg by mouth See admin instructions. Takes 1 tablet every night plus an additional tablet on Sunday, Tuesday, Thursday and Friday      . VIAGRA 100 MG tablet Take 100 mg by mouth as needed. For sexual activity       No current facility-administered medications for this visit.     Physical Exam: BP 112/74 HR 75 Resp 16 Wounds: Healing well, no erythema or drainage Heart: RRR Lungs: Clear to auscultation    Diagnostic Tests: Chest xray: Dg Chest 2 View  01/18/2013  *RADIOLOGY REPORT*  Clinical Data: History of right chest injury with rib fractures and pleural effusion.  Shortness of breath.  CHEST - 2 VIEW  Comparison: 01/05/2013 and CT chest 12/31/2012.  Findings: Trachea is midline.  Heart size normal.  Collection of partially loculated pleural fluid and air at the base of the right hemithorax is again seen with interval decrease in  the amount of pleural fluid.  There is associated volume loss at the base of the right hemithorax.  Left lung is clear.  No left pleural fluid.  IMPRESSION: Partially loculated hydropneumothorax at the base of the right hemithorax has improved in the interval, with decrease in the amount of pleural fluid.   Original Report Authenticated By: Leanna Battles, M.D.        Assessment/Plan: [name] is recovering well from surgery.  His x-ray has improved and he is clinically stable.  He and his wife have requested to return to the office to see Dr. Cornelius Moras in follow up, and we will schedule him to return in 3-4 weeks with a repeat chest x-ray.

## 2013-01-22 ENCOUNTER — Ambulatory Visit: Payer: BC Managed Care – PPO | Admitting: Thoracic Surgery (Cardiothoracic Vascular Surgery)

## 2013-01-25 ENCOUNTER — Ambulatory Visit: Payer: BC Managed Care – PPO | Admitting: Thoracic Surgery (Cardiothoracic Vascular Surgery)

## 2013-02-02 ENCOUNTER — Telehealth: Payer: Self-pay | Admitting: Internal Medicine

## 2013-02-02 NOTE — Telephone Encounter (Signed)
Pt needs a post hospital fup on Mon or Tues. Only "same day" appt left for  A 30 min appt. Pt had collapsed lung. Pls advise.

## 2013-02-02 NOTE — Telephone Encounter (Signed)
Pt aware/kjh 

## 2013-02-03 ENCOUNTER — Other Ambulatory Visit: Payer: Self-pay | Admitting: *Deleted

## 2013-02-03 DIAGNOSIS — J90 Pleural effusion, not elsewhere classified: Secondary | ICD-10-CM

## 2013-02-04 ENCOUNTER — Other Ambulatory Visit: Payer: Self-pay | Admitting: *Deleted

## 2013-02-04 DIAGNOSIS — J90 Pleural effusion, not elsewhere classified: Secondary | ICD-10-CM

## 2013-02-08 ENCOUNTER — Ambulatory Visit
Admission: RE | Admit: 2013-02-08 | Discharge: 2013-02-08 | Disposition: A | Discharge status: Home / Self Care | Payer: BC Managed Care – PPO | Source: Ambulatory Visit | Attending: Thoracic Surgery (Cardiothoracic Vascular Surgery) | Admitting: Thoracic Surgery (Cardiothoracic Vascular Surgery)

## 2013-02-08 ENCOUNTER — Ambulatory Visit (INDEPENDENT_AMBULATORY_CARE_PROVIDER_SITE_OTHER): Payer: Self-pay | Admitting: Thoracic Surgery (Cardiothoracic Vascular Surgery)

## 2013-02-08 ENCOUNTER — Ambulatory Visit (INDEPENDENT_AMBULATORY_CARE_PROVIDER_SITE_OTHER): Payer: BC Managed Care – PPO | Admitting: Internal Medicine

## 2013-02-08 ENCOUNTER — Ambulatory Visit: Payer: BC Managed Care – PPO | Admitting: Thoracic Surgery (Cardiothoracic Vascular Surgery)

## 2013-02-08 ENCOUNTER — Encounter: Payer: Self-pay | Admitting: Thoracic Surgery (Cardiothoracic Vascular Surgery)

## 2013-02-08 ENCOUNTER — Encounter: Payer: Self-pay | Admitting: Internal Medicine

## 2013-02-08 VITALS — BP 110/70 | HR 77 | Resp 18 | Ht 72.0 in | Wt 158.0 lb

## 2013-02-08 VITALS — BP 110/60 | HR 78 | Temp 97.5°F | Resp 18 | Wt 174.0 lb

## 2013-02-08 DIAGNOSIS — Z09 Encounter for follow-up examination after completed treatment for conditions other than malignant neoplasm: Secondary | ICD-10-CM | POA: Insufficient documentation

## 2013-02-08 DIAGNOSIS — R7309 Other abnormal glucose: Secondary | ICD-10-CM

## 2013-02-08 DIAGNOSIS — F17200 Nicotine dependence, unspecified, uncomplicated: Secondary | ICD-10-CM

## 2013-02-08 DIAGNOSIS — J90 Pleural effusion, not elsewhere classified: Secondary | ICD-10-CM

## 2013-02-08 DIAGNOSIS — J942 Hemothorax: Secondary | ICD-10-CM

## 2013-02-08 DIAGNOSIS — R7302 Impaired glucose tolerance (oral): Secondary | ICD-10-CM

## 2013-02-08 LAB — GLUCOSE, POCT (MANUAL RESULT ENTRY): POC Glucose: 107 mg/dl — AB (ref 70–99)

## 2013-02-08 MED ORDER — BUPROPION HCL ER (XL) 150 MG PO TB24: 150.0000 mg | ORAL_TABLET | Freq: Two times a day (BID) | ORAL | Status: DC

## 2013-02-08 NOTE — Progress Notes (Signed)
Subjective:    Patient ID: [name], male    DOB: 1966/08/10, 47 y.o.   MRN: [id number]  HPI  47 year old patient who is seen today following her hospital discharge. He was seen earlier today by cardiothoracic surgery the performed a right-sided VATS for a traumatic hemothorax. Postoperatively the patient has done quite well. Hospital records reviewed. Renal studies were reviewed and normalized. He apparently had some mild stress induced impaired glucose tolerance.  Random blood sugar today 107  He generally feels well. He is accompanied by his wife and has had numerous questions  He is followed by neurology for MS.  His ongoing tobacco use and is requesting a refill on Wellbutrin   Hospital records reviewed  Past Medical History  Diagnosis Date  . ALLERGIC RHINITIS 03/27/2010  . GERD 03/27/2010  . HYPERLIPIDEMIA 03/27/2010  . MULTIPLE SCLEROSIS, RELAPSING/REMITTING 03/27/2010  . NICOTINE ADDICTION 05/24/2010  . TMJ (dislocation of temporomandibular joint)   . Insomnia   . Neurogenic bladder   . Rib fracture 11/2012  . Pleural effusion on right 12/31/2012    Secondary to rib fractures  . Fracture, ribs 12/31/2012    Non-displaced fracture of right 8th-11th ribs    History   Social History  . Marital Status: Married    Spouse Name: N/A    Number of Children: N/A  . Years of Education: N/A   Occupational History  . Not on file.   Social History Main Topics  . Smoking status: Former Smoker -- 0.50 packs/day    Types: Cigarettes    Start date: 10/16/2012  . Smokeless tobacco: Former Neurosurgeon    Quit date: 10/31/2012  . Alcohol Use: Yes     Comment: occasionally  . Drug Use: No  . Sexually Active: Not on file   Other Topics Concern  . Not on file   Social History Narrative  . No narrative on file    Past Surgical History  Procedure Laterality Date  . Ankle surgery    . Foot surgery    . Pleural effusion drainage  01/01/2013    Procedure: DRAINAGE OF PLEURAL  EFFUSION;  Surgeon: Purcell Nails, MD;  Location: [facility] OR;  Service: Thoracic;  Laterality: Right;    Family History  Problem Relation Age of Onset  . Cancer Father     lung ca    Allergies  Allergen Reactions  . Amoxicillin Swelling  . Ampicillin Swelling  . Morphine Swelling    Current Outpatient Prescriptions on File Prior to Visit  Medication Sig Dispense Refill  . acetaminophen (TYLENOL) 650 MG CR tablet Take 650 mg by mouth daily.        . calcium carbonate 200 MG capsule Take by mouth daily.        . diphenhydramine-acetaminophen (TYLENOL PM) 25-500 MG TABS Take 1 tablet by mouth at bedtime as needed. For sleep      . donepezil (ARICEPT) 10 MG tablet Take 10 mg by mouth daily.        . Eszopiclone (ESZOPICLONE) 3 MG TABS Take 3 mg by mouth See admin instructions. Takes on Sat, Monday and Wednesday      . finasteride (PROPECIA) 1 MG tablet Take 1 tablet (1 mg total) by mouth daily.  90 tablet  6  . gabapentin (NEURONTIN) 600 MG tablet Take 600 mg by mouth 3 (three) times daily.      Marland Kitchen ibuprofen (ADVIL,MOTRIN) 800 MG tablet Take 800 mg by mouth at bedtime as needed.      Marland Kitchen  interferon beta-1a (REBIF) 44 MCG/0.5ML injection Inject 44 mcg into the skin See admin instructions. Takes on Saturday, Monday and wednesday      . L-Methylfolate-B6-B12 (METANX) 3-35-2 MG TABS Take 1 tablet by mouth 2 (two) times daily.  180 tablet  1  . LORazepam (ATIVAN) 2 MG tablet Take 2 mg by mouth daily.      . nicotine polacrilex (COMMIT) 4 MG lozenge Place 2 mg inside cheek as needed. For nicotine craving      . omeprazole (PRILOSEC) 20 MG capsule Take 20 mg by mouth daily.        . traZODone (DESYREL) 50 MG tablet Take 50-100 mg by mouth See admin instructions. Takes 1 tablet every night plus an additional tablet on Sunday, Tuesday, Thursday and Friday      . VIAGRA 100 MG tablet Take 100 mg by mouth as needed. For sexual activity       No current facility-administered medications on file prior to  visit.    BP 110/60  Pulse 78  Temp(Src) 97.5 F (36.4 C) (Oral)  Resp 18  Wt 174 lb (78.926 kg)  BMI 23.59 kg/m2  SpO2 97%      Review of Systems  Constitutional: Negative for fever, chills, appetite change and fatigue.  HENT: Negative for hearing loss, ear pain, congestion, sore throat, trouble swallowing, neck stiffness, dental problem, voice change and tinnitus.   Eyes: Negative for pain, discharge and visual disturbance.  Respiratory: Negative for cough, chest tightness, wheezing and stridor.   Cardiovascular: Negative for chest pain, palpitations and leg swelling.  Gastrointestinal: Negative for nausea, vomiting, abdominal pain, diarrhea, constipation, blood in stool and abdominal distention.  Genitourinary: Negative for urgency, hematuria, flank pain, discharge, difficulty urinating and genital sores.  Musculoskeletal: Negative for myalgias, back pain, joint swelling, arthralgias and gait problem.  Skin: Positive for rash.  Neurological: Negative for dizziness, syncope, speech difficulty, weakness, numbness and headaches.  Hematological: Negative for adenopathy. Does not bruise/bleed easily.  Psychiatric/Behavioral: Negative for behavioral problems and dysphoric mood. The patient is not nervous/anxious.        Objective:   Physical Exam  Constitutional: He is oriented to person, place, and time. He appears well-developed.  HENT:  Head: Normocephalic.  Right Ear: External ear normal.  Left Ear: External ear normal.  Eyes: Conjunctivae and EOM are normal.  Neck: Normal range of motion.  Cardiovascular: Normal rate and normal heart sounds.   Pulmonary/Chest: Effort normal.  Much improved aeration right chest although still mildly diminished  Abdominal: Bowel sounds are normal.  Musculoskeletal: Normal range of motion. He exhibits no edema and no tenderness.  Neurological: He is alert and oriented to person, place, and time.  Skin:  Healing right-sided thoracotomy  scar is  Psychiatric: He has a normal mood and affect. His behavior is normal.          Assessment & Plan:   Status post VATS for traumatic hemothorax MS Tobacco abuse. Wellbutrin will be refilled  Followup neurology Return here 6 months or as needed

## 2013-02-08 NOTE — Patient Instructions (Signed)
Smoking tobacco is very bad for your health. You should stop smoking immediately.    It is important that you exercise regularly, at least 20 minutes 3 to 4 times per week.  If you develop chest pain or shortness of breath seek  medical attention.  Folliculitis  Folliculitis is redness, soreness, and swelling (inflammation) of the hair follicles. This condition can occur anywhere on the body. People with weakened immune systems, diabetes, or obesity have a greater risk of getting folliculitis. CAUSES  Bacterial infection. This is the most common cause.  Fungal infection.  Viral infection.  Contact with certain chemicals, especially oils and tars. Long-term folliculitis can result from bacteria that live in the nostrils. The bacteria may trigger multiple outbreaks of folliculitis over time. SYMPTOMS Folliculitis most commonly occurs on the scalp, thighs, legs, back, buttocks, and areas where hair is shaved frequently. An early sign of folliculitis is a small, white or yellow, pus-filled, itchy lesion (pustule). These lesions appear on a red, inflamed follicle. They are usually less than 0.2 inches (5 mm) wide. When there is an infection of the follicle that goes deeper, it becomes a boil or furuncle. A group of closely packed boils creates a larger lesion (carbuncle). Carbuncles tend to occur in hairy, sweaty areas of the body. DIAGNOSIS  Your caregiver can usually tell what is wrong by doing a physical exam. A sample may be taken from one of the lesions and tested in a lab. This can help determine what is causing your folliculitis. TREATMENT  Treatment may include:  Applying warm compresses to the affected areas.  Taking antibiotic medicines orally or applying them to the skin.  Draining the lesions if they contain a large amount of pus or fluid.  Laser hair removal for cases of long-lasting folliculitis. This helps to prevent regrowth of the hair. HOME CARE INSTRUCTIONS  Apply warm  compresses to the affected areas as directed by your caregiver.  If antibiotics are prescribed, take them as directed. Finish them even if you start to feel better.  You may take over-the-counter medicines to relieve itching.  Do not shave irritated skin.  Follow up with your caregiver as directed. SEEK IMMEDIATE MEDICAL CARE IF:   You have increasing redness, swelling, or pain in the affected area.  You have a fever. MAKE SURE YOU:  Understand these instructions.  Will watch your condition.  Will get help right away if you are not doing well or get worse. Document Released: 02/03/2002 Document Revised: 05/26/2012 Document Reviewed: 02/25/2012 [facility] Patient Information 2013 [place], [place].

## 2013-02-08 NOTE — Progress Notes (Signed)
[address]            [place] [address]          [phone]     CARDIOTHORACIC SURGERY OFFICE NOTE  Referring Provider is Gordy Savers, MD PCP is Rogelia Boga, MD   HPI:  Patient returns for routine followup status post right video assisted thoracoscopy for drainage of pleural effusion and hemothorax. The patient originally suffered a fall approximately 6 weeks prior to surgery associated with multiple rib fractures on the right side. He subsequently developed a large right pleural effusion associated with a small amount of residual old blood clot from the trauma. Postoperatively he did well and he returns for routine followup today. The patient states that he still has mild residual soreness in the right mid back lateral chest wall. His pain is much less than it was before and for the most part is nearly completely resolved. He has no shortness of breath. He has no sensation of clicking or motion of the chest wall with breathing or coughing. He has no fevers or chills. The remainder of his review of systems unremarkable.   Current Outpatient Prescriptions  Medication Sig Dispense Refill  . acetaminophen (TYLENOL) 650 MG CR tablet Take 650 mg by mouth daily.        . calcium carbonate 200 MG capsule Take by mouth daily.        . diphenhydramine-acetaminophen (TYLENOL PM) 25-500 MG TABS Take 1 tablet by mouth at bedtime as needed. For sleep      . donepezil (ARICEPT) 10 MG tablet Take 10 mg by mouth daily.        . Eszopiclone (ESZOPICLONE) 3 MG TABS Take 3 mg by mouth See admin instructions. Takes on Sat, Monday and Wednesday      . finasteride (PROPECIA) 1 MG tablet Take 1 tablet (1 mg total) by mouth daily.  90 tablet  6  . gabapentin (NEURONTIN) 600 MG tablet Take 600 mg by mouth 3 (three) times daily.      Marland Kitchen ibuprofen (ADVIL,MOTRIN) 800 MG tablet Take 800 mg by mouth at bedtime as needed.      . interferon beta-1a (REBIF) 44  MCG/0.5ML injection Inject 44 mcg into the skin See admin instructions. Takes on Saturday, Monday and wednesday      . L-Methylfolate-B6-B12 (METANX) 3-35-2 MG TABS Take 1 tablet by mouth 2 (two) times daily.  180 tablet  1  . LORazepam (ATIVAN) 2 MG tablet Take 2 mg by mouth daily.      . nicotine polacrilex (COMMIT) 4 MG lozenge Place 2 mg inside cheek as needed. For nicotine craving      . omeprazole (PRILOSEC) 20 MG capsule Take 20 mg by mouth daily.        . traZODone (DESYREL) 50 MG tablet Take 50-100 mg by mouth See admin instructions. Takes 1 tablet every night plus an additional tablet on Sunday, Tuesday, Thursday and Friday      . VIAGRA 100 MG tablet Take 100 mg by mouth as needed. For sexual activity       No current facility-administered medications for this visit.      Physical Exam:   BP 110/70  Pulse 77  Resp 18  Ht 6' (1.829 m)  Wt 158 lb (71.668 kg)  BMI 21.42 kg/m2  SpO2 99%  General:  Well-appearing  Chest:  Clear to auscultation with symmetrical breath sounds  CV:   Regular rate and rhythm  Incisions:  Completely healed  Abdomen:  Soft and nontender  Extremities:  Warm and well-perfused  Diagnostic Tests:  Chest x-ray performed today demonstrates further radiographic improvement with trivial residual right pleural effusion. The rib fractures seem to be healing appropriately.   Impression:  The patient is doing well following recent right VATS for drainage of pleural effusion.  Plan:  In the future the patient will call and return to see [place] as needed. All of his questions been addressed.   Salvatore Decent. Cornelius Moras, MD 02/08/2013 2:23 PM

## 2013-02-08 NOTE — Patient Instructions (Signed)
Patient may gradually resume normal activity without limitation.  He has been reminded to continue to abstain from tobacco use indefinitely.

## 2013-02-19 ENCOUNTER — Telehealth: Payer: Self-pay | Admitting: Internal Medicine

## 2013-02-19 MED ORDER — BUPROPION HCL ER (XL) 150 MG PO TB24: 150.0000 mg | ORAL_TABLET | Freq: Two times a day (BID) | ORAL | Status: DC

## 2013-02-19 NOTE — Telephone Encounter (Signed)
Pt notified Rx for Wellbutrin was sent to [facility].

## 2013-02-19 NOTE — Telephone Encounter (Signed)
Patient's wife calling to state Wellbutrin rx was suppose to be sent to [facility] and not [facility].  Requesting another script be sent to [facility].

## 2013-04-25 ENCOUNTER — Other Ambulatory Visit: Payer: Self-pay | Admitting: Internal Medicine

## 2013-05-12 ENCOUNTER — Telehealth: Payer: Self-pay | Admitting: *Deleted

## 2013-05-12 NOTE — Telephone Encounter (Signed)
Pt here for lab draw.  Dr. Sandria Manly former pt.   Here asking for lab orders to get labs drawn (usuallu 6 mo ).  Will send to Dr. Marjory Lies and will be seeing him as an appt in 06-02-13.  Please advise.

## 2013-05-28 ENCOUNTER — Other Ambulatory Visit: Payer: Self-pay | Admitting: Nurse Practitioner

## 2013-05-28 DIAGNOSIS — G35 Multiple sclerosis: Secondary | ICD-10-CM

## 2013-05-28 NOTE — Telephone Encounter (Signed)
I put orders in for CBC, and LFTs. Please call pt and let him know he can come in before scheduled visit (6/25) to have them drawn or wait until day of visit. -LL

## 2013-05-28 NOTE — Telephone Encounter (Signed)
Left a vm on pt's mobile phone stating that he may come in to have his labs drawn any day before his 6/25 appointment or on the day of his appointment.

## 2013-05-28 NOTE — Progress Notes (Signed)
I put orders in for CBC, and LFTs. Please call pt and let him know he can come in before scheduled visit (6/25) to have them drawn or wait until day of visit. -LL 

## 2013-06-01 ENCOUNTER — Other Ambulatory Visit: Payer: Self-pay | Admitting: Diagnostic Neuroimaging

## 2013-06-02 ENCOUNTER — Encounter: Payer: Self-pay | Admitting: Diagnostic Neuroimaging

## 2013-06-02 ENCOUNTER — Ambulatory Visit (INDEPENDENT_AMBULATORY_CARE_PROVIDER_SITE_OTHER): Payer: BC Managed Care – PPO | Admitting: Diagnostic Neuroimaging

## 2013-06-02 VITALS — BP 104/66 | HR 73 | Temp 98.3°F | Ht 71.0 in | Wt 184.0 lb

## 2013-06-02 DIAGNOSIS — G35 Multiple sclerosis: Secondary | ICD-10-CM

## 2013-06-02 LAB — CBC WITH DIFFERENTIAL/PLATELET
Basophils Absolute: 0 10*3/uL (ref 0.0–0.2)
Eos: 2 % (ref 0–5)
HCT: 41.5 % (ref 37.5–51.0)
Hemoglobin: 13.8 g/dL (ref 12.6–17.7)
Lymphocytes Absolute: 0.7 10*3/uL (ref 0.7–3.1)
MCHC: 33.3 g/dL (ref 31.5–35.7)
Monocytes: 13 % — ABNORMAL HIGH (ref 4–12)
Neutrophils Absolute: 3.9 10*3/uL (ref 1.4–7.0)

## 2013-06-02 LAB — HEPATIC FUNCTION PANEL
AST: 19 IU/L (ref 0–40)
Albumin: 4.4 g/dL (ref 3.5–5.5)
Alkaline Phosphatase: 83 IU/L (ref 39–117)
Bilirubin, Direct: 0.19 mg/dL (ref 0.00–0.40)
Total Bilirubin: 0.7 mg/dL (ref 0.0–1.2)

## 2013-06-02 MED ORDER — METANX 3-35-2 MG PO TABS: 1.0000 | ORAL_TABLET | Freq: Two times a day (BID) | ORAL | Status: DC

## 2013-06-02 MED ORDER — SILDENAFIL CITRATE 100 MG PO TABS: 100.0000 mg | ORAL_TABLET | ORAL | Status: DC | PRN

## 2013-06-02 MED ORDER — DONEPEZIL HCL 10 MG PO TABS: 10.0000 mg | ORAL_TABLET | Freq: Every day | ORAL | Status: DC

## 2013-06-02 MED ORDER — GABAPENTIN 600 MG PO TABS: 600.0000 mg | ORAL_TABLET | Freq: Three times a day (TID) | ORAL | Status: DC

## 2013-06-02 MED ORDER — TRAZODONE HCL 50 MG PO TABS: 50.0000 mg | ORAL_TABLET | ORAL | Status: DC

## 2013-06-02 MED ORDER — ESZOPICLONE 3 MG PO TABS: 3.0000 mg | ORAL_TABLET | ORAL | Status: DC

## 2013-06-02 MED ORDER — IBUPROFEN 800 MG PO TABS: 800.0000 mg | ORAL_TABLET | Freq: Every evening | ORAL | Status: DC | PRN

## 2013-06-02 NOTE — Patient Instructions (Signed)
Look up MS cooling vests online. We can prescribe one.  Continue current medications.  I will order MRI brain.  Try to start mild exercise program. Be careful with balance issues and overheating.

## 2013-06-02 NOTE — Progress Notes (Signed)
[facility]  PATIENT: [name] DOB: 03-08-1966  REFERRING CLINICIAN:  HISTORY FROM: patient and wife REASON FOR VISIT: follow up   HISTORICAL  CHIEF COMPLAINT:  Chief Complaint  Patient presents with  . Follow-up    HISTORY OF PRESENT ILLNESS:   UPDATE 06/02/13: Since last visit, patient is doing fair. Had fall in Dec 2013, complicated by rib fracture and  Hemothorax, then requiring thoracentesis (12/31/12), then VATS drainage (01/01/13), ultimately with good recovery. He does not note any progression of MS symptoms. Wife notes some ongoing progression of short-term memory problems, cognitive difficulty and balance difficulty. Patient continues to be on rebif, tolerating well. He has developed some sclerosis at injection sites. Patient asking several questions about MS medications, symptoms, and if there are any new treatment options. He also asked me about various supplements and over-the-counter medications.  PRIOR HPI (12/16/12, Dr. Sandria Manly): 47 year old left-handed white married male with short-term memory loss beginning in 2003 and gait disorder evaluated 6/26.2004. [place] 10/2003 showed a  full-scale IQ of 110. MRI study of the cervical spine showed multiple lesions none of which enhanced. MRI study of the brain with and without contrast 06/21/03 showed multiple ovoid white matter lesions bilaterally which did not enhance. CSF 08/08/2003 showed ACE 0.8, CSF IgG synthesis increased, CSF IgG oligoclonal banding positive, and CSF A beta 42 peptide and tau protein consistent with the diagnosis of Alzheimers disease. He began Rebif in September 2004 initially complicated by elevated liver function tests requiring  discontinuation of medicine but was able to restart the medication and has done well. He takes ibuprofen 800 mg on the night that he takes rebif.   MRIs of the brain and cervical spine 02/07/2004 showed evidence of periventricular MS plaques and disease in  the spinal cord from the foramen magnum down to the upper thoracic regions similar to August 2004. MRIs of the brain and cervical spine 03/06/2006 showed no change. MRIs of the brain and cervical spine 12/27/2006 again showed multiple periventricular and subcortical white matter hyperintensities characteristic of MS with the presence of T1 black holes, atrophy of the corpus callosum, and cerebral cortex. Cervical spine showed lesions at C1, C3, C4, C7 consistent with remote old plaques and  no significant change versus 2007. Last MRI of the brain and cervical spine 05/02/11 showed no change. There were 7 lesions in the cord between C2-T3 and multiple in the brain. He has never had enhancing lesions.  He has numb feet that burn or have a freezing sensation on the bottom of the feet  that occurs 24 hours ,7 days per week. His feet feel better without shoes and socks. He rates the pain at 3/10 He uses gabapentin and anodyne therapy.  A trial of amitriptyline caused lethargy and a dazed sensation. The discomfort is on the bottoms of his feet.It is worse with tight shoes and socks. It is also worse by "walking a lot". EMG/NCV 20/17/12 was normal.   He has difficulty with memory. He has not had his repeat neuropsychological studies.    He has not had urinary tract infections.He is followed by Dr. 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 He has hesitancy and dribbling after voiding. He has infrequent  bowel accidents. He complains of constipation. He uses Viagra for sexual function.  He uses a recumbent stationary bike 3 times per week for 30 minutes for exercise. He is independent in his activities of daily living and drives a car.He  has difficulty sleeping and uses lunesta.He misses his family and friends in [place]. 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 He has been unable to find a job.11/21/2011=( MMSE 29/30. CDT not done. AFT 14. Katz index of independence in  activities of daily living 6. Lawton-Brody Instrumental  activities of daily living scale 8. Neuropsychiatric inventory= agitation 3, depression 8, apathy 6, irritability 6, appetite 2. Geriatric depression scale 10). Chantix was ordered 07/07/12 and refilled 11/03/12 11/10/2012 CBC normal but creatinine 1.41  11/17/12 creatinine 1.3.   Chantix nd Wellbutrin discontinued. Patient fell 11/20/12 with fractured ribs and pleural effusion.  He also developed fever and chills.  He has decreased appetite with weight loss  He discontinued cigarettes 10/2012. He has right shoulder and rib pain.  12/14/12 WBC 10,100 and Hgb 12.1.  Creatinine 1.33  REVIEW OF SYSTEMS: Full 14 system review of systems performed and notable only for fatigue loss of vision flatulence urination problems impotence allergies feeling hot memory loss numbness in feet weakness of the right ankle insomnia not asleep decreased energy disinterest in activities racing thoughts.  ALLERGIES: Allergies  Allergen Reactions  . Amoxicillin Swelling  . Ampicillin Swelling  . Morphine Swelling    HOME MEDICATIONS: Outpatient Prescriptions Prior to Visit  Medication Sig Dispense Refill  . acetaminophen (TYLENOL) 650 MG CR tablet Take 650 mg by mouth daily.        . calcium carbonate 200 MG capsule Take by mouth daily.        . diphenhydramine-acetaminophen (TYLENOL PM) 25-500 MG TABS Take 1 tablet by mouth at bedtime as needed. For sleep      . interferon beta-1a (REBIF) 44 MCG/0.5ML injection Inject 44 mcg into the skin See admin instructions. Takes on Saturday, Monday and wednesday      . LORazepam (ATIVAN) 2 MG tablet Take 2 mg by mouth daily.      . nicotine polacrilex (COMMIT) 4 MG lozenge Place 2 mg inside cheek as needed. For nicotine craving      . omeprazole (PRILOSEC) 20 MG capsule Take 20 mg by mouth daily.        Marland Kitchen PROPECIA 1 MG tablet TAKE ONE TABLET BY MOUTH ONE TIME DAILY  90 tablet  4  . donepezil (ARICEPT) 10 MG tablet Take 10 mg  by mouth daily.        . Eszopiclone (ESZOPICLONE) 3 MG TABS Take 3 mg by mouth See admin instructions. Takes on Sat, Monday and Wednesday      . gabapentin (NEURONTIN) 600 MG tablet Take 600 mg by mouth 3 (three) times daily.      Marland Kitchen ibuprofen (ADVIL,MOTRIN) 800 MG tablet Take 800 mg by mouth at bedtime as needed.      Marland Kitchen L-Methylfolate-B6-B12 (METANX) 3-35-2 MG TABS Take 1 tablet by mouth 2 (two) times daily.  180 tablet  1  . traZODone (DESYREL) 50 MG tablet Take 50-100 mg by mouth See admin instructions. Takes 1 tablet every night plus an additional tablet on Sunday, Tuesday, Thursday and Friday      . VIAGRA 100 MG tablet Take 100 mg by mouth as needed. For sexual activity      . buPROPion (WELLBUTRIN XL) 150 MG 24 hr tablet Take 1 tablet (150 mg total) by mouth 2 (two) times daily.  180 tablet  3   No facility-administered medications prior to visit.    PAST MEDICAL HISTORY: Past Medical History  Diagnosis Date  . ALLERGIC RHINITIS 03/27/2010  .  GERD 03/27/2010  . HYPERLIPIDEMIA 03/27/2010  . MULTIPLE SCLEROSIS, RELAPSING/REMITTING 03/27/2010  . NICOTINE ADDICTION 05/24/2010  . TMJ (dislocation of temporomandibular joint)   . Insomnia   . Neurogenic bladder   . Rib fracture 11/2012  . Pleural effusion on right 12/31/2012    Secondary to rib fractures  . Fracture, ribs 12/31/2012    Non-displaced fracture of right 8th-11th ribs  . Gait disorder     PAST SURGICAL HISTORY: Past Surgical History  Procedure Laterality Date  . Ankle surgery    . Foot surgery    . Pleural effusion drainage  01/01/2013    Procedure: DRAINAGE OF PLEURAL EFFUSION;  Surgeon: Purcell Nails, MD;  Location: [facility] OR;  Service: Thoracic;  Laterality: Right;    FAMILY HISTORY: Family History  Problem Relation Age of Onset  . Cancer Father     lung ca    SOCIAL HISTORY:  History   Social History  . Marital Status: Married    Spouse Name: Johnny Bridge    Number of Children: 0  . Years of Education: Law    Occupational History  . Self-Employed     Property Management  .     Social History Main Topics  . Smoking status: Former Smoker -- 0.50 packs/day for 20 years    Types: Cigarettes    Quit date: 10/16/2012  . Smokeless tobacco: Former Neurosurgeon    Quit date: 10/31/2012  . Alcohol Use: Yes     Comment: occasionally  . Drug Use: No  . Sexually Active: Not on file   Other Topics Concern  . Not on file   Social History Narrative   Pt lives at home with spouse.    Caffeine Use: none     PHYSICAL EXAM  Filed Vitals:   06/02/13 1458  BP: 104/66  Pulse: 73  Temp: 98.3 F (36.8 C)  TempSrc: Oral  Height: 5\' 11"  (1.803 m)  Weight: 184 lb (83.462 kg)    Not recorded    Body mass index is 25.67 kg/(m^2).  GENERAL EXAM: Patient is in no distress  CARDIOVASCULAR: Regular rate and rhythm, no murmurs, no carotid bruits  NEUROLOGIC: MENTAL STATUS: awake, alert, language fluent, comprehension intact, naming intact CRANIAL NERVE: no papilledema on fundoscopic exam, pupils equal and reactive to light, visual fields full to confrontation, extraocular muscles intact, MILD NYSTAGMUS ON RIGHT AND LEFT GAZE. Facial sensation and strength symmetric, uvula midline, shoulder shrug symmetric, tongue midline. MOTOR: normal bulk and tone, full strength in the BUE, BLE; MILD POSTURAL TREMOR. SENSORY: normal and symmetric to light touch, pinprick, temperature, vibration COORDINATION: finger-nose-finger, fine finger movements normal REFLEXES: deep tendon reflexes present and symmetric GAIT/STATION: narrow based gait; TANDEM UNSTEADY. Romberg is negative   DIAGNOSTIC DATA (LABS, IMAGING, TESTING) - I reviewed patient records, labs, notes, testing and imaging myself where available.  Lab Results  Component Value Date   WBC 5.4 06/01/2013   HGB 13.8 06/01/2013   HCT 41.5 06/01/2013   MCV 88 06/01/2013   PLT 337 01/05/2013      Component Value Date/Time   NA 138 01/03/2013 0655   K 4.3  01/03/2013 0655   CL 98 01/03/2013 0655   CO2 32 01/03/2013 0655   GLUCOSE 131* 01/03/2013 0655   BUN 11 01/03/2013 0655   CREATININE 1.18 01/03/2013 0655   CREATININE 1.33 12/14/2012 2023   CALCIUM 8.7 01/03/2013 0655   PROT 6.8 06/01/2013 1513   PROT 6.0 01/03/2013 0655   ALBUMIN 2.2* 01/03/2013  0655   AST 19 06/01/2013 1513   ALT 22 06/01/2013 1513   ALKPHOS 83 06/01/2013 1513   BILITOT 0.7 06/01/2013 1513   GFRNONAA 72* 01/03/2013 0655   GFRAA 83* 01/03/2013 0655   Lab Results  Component Value Date   CHOL 199 03/27/2010   No results found for this basename: HGBA1C   No results found for this basename: VITAMINB12   Lab Results  Component Value Date   TSH 0.53 12/28/2012     ASSESSMENT AND PLAN  47 y.o. year old male  has a past medical history of ALLERGIC RHINITIS (03/27/2010); GERD (03/27/2010); HYPERLIPIDEMIA (03/27/2010); MULTIPLE SCLEROSIS, RELAPSING/REMITTING (03/27/2010); NICOTINE ADDICTION (05/24/2010); TMJ (dislocation of temporomandibular joint); Insomnia; Neurogenic bladder; Rib fracture (11/2012); Pleural effusion on right (12/31/2012); Fracture, ribs (12/31/2012); and Gait disorder. here with multiple sclerosis. Former patient of Dr. Sandria Manly. Treatment options reviewed. Last MRI brain in 2012.  PLAN: 1. MRI brain (with and without)  2. Then may consider rebif vs.other meds (gilenya, tecfidera, tysabri) 3. Other meds refilled today   Orders Placed This Encounter  Procedures  . MR Brain W Wo Contrast    Suanne Marker, MD 06/02/2013, 4:47 PM Certified in Neurology, Neurophysiology and Neuroimaging  [facility] [address], [address] [place], [place] [address] [phone]

## 2013-06-03 DIAGNOSIS — G35 Multiple sclerosis: Secondary | ICD-10-CM | POA: Insufficient documentation

## 2013-06-15 ENCOUNTER — Telehealth: Payer: Self-pay | Admitting: Diagnostic Neuroimaging

## 2013-06-16 ENCOUNTER — Telehealth: Payer: Self-pay | Admitting: Nurse Practitioner

## 2013-06-16 NOTE — Telephone Encounter (Signed)
Called patient with normal lab results.  He acknowledged results  With no questions. LL

## 2013-06-16 NOTE — Telephone Encounter (Signed)
I called patient and gave him results of cbc and LFTs, they are normal. LL

## 2013-06-18 ENCOUNTER — Ambulatory Visit
Admission: RE | Admit: 2013-06-18 | Discharge: 2013-06-18 | Disposition: A | Discharge status: Home / Self Care | Payer: BC Managed Care – PPO | Source: Ambulatory Visit | Attending: Diagnostic Neuroimaging | Admitting: Diagnostic Neuroimaging

## 2013-06-18 DIAGNOSIS — G35 Multiple sclerosis: Secondary | ICD-10-CM

## 2013-06-18 MED ORDER — GADOBENATE DIMEGLUMINE 529 MG/ML IV SOLN
17.0000 mL | Freq: Once | INTRAVENOUS | Status: AC | PRN
  Administered 2013-06-18: 17 mL via INTRAVENOUS

## 2013-08-16 ENCOUNTER — Telehealth: Payer: Self-pay | Admitting: *Deleted

## 2013-08-16 NOTE — Telephone Encounter (Signed)
Mrs. Kimball called with concerns of her husband's symptoms of fever, chills, and chest heaviness.  She is requesting a chest xray for him.  Dr. Cornelius Moras performed surgery on 01/01/13  to remove a right pl eff/hemothorax, s/p fall/rib fxs. and has been released from our care.  I called both Mr. And Mrs. Chenault  to advise them that he should see his PCP first and then he can refer him back to Dr. Cornelius Moras if needed.

## 2013-08-27 ENCOUNTER — Other Ambulatory Visit: Payer: Self-pay

## 2013-08-27 DIAGNOSIS — J869 Pyothorax without fistula: Secondary | ICD-10-CM

## 2013-08-27 DIAGNOSIS — J90 Pleural effusion, not elsewhere classified: Secondary | ICD-10-CM

## 2013-08-30 ENCOUNTER — Ambulatory Visit
Admission: RE | Admit: 2013-08-30 | Discharge: 2013-08-30 | Disposition: A | Discharge status: Home / Self Care | Payer: BC Managed Care – PPO | Source: Ambulatory Visit | Attending: Thoracic Surgery (Cardiothoracic Vascular Surgery) | Admitting: Thoracic Surgery (Cardiothoracic Vascular Surgery)

## 2013-08-30 ENCOUNTER — Encounter: Payer: Self-pay | Admitting: Thoracic Surgery (Cardiothoracic Vascular Surgery)

## 2013-08-30 ENCOUNTER — Other Ambulatory Visit: Payer: Self-pay | Admitting: *Deleted

## 2013-08-30 ENCOUNTER — Ambulatory Visit (INDEPENDENT_AMBULATORY_CARE_PROVIDER_SITE_OTHER): Payer: BC Managed Care – PPO | Admitting: Thoracic Surgery (Cardiothoracic Vascular Surgery)

## 2013-08-30 VITALS — BP 125/77 | HR 88 | Resp 16 | Ht 72.0 in | Wt 170.0 lb

## 2013-08-30 DIAGNOSIS — J869 Pyothorax without fistula: Secondary | ICD-10-CM

## 2013-08-30 DIAGNOSIS — J90 Pleural effusion, not elsewhere classified: Secondary | ICD-10-CM

## 2013-08-30 DIAGNOSIS — R0602 Shortness of breath: Secondary | ICD-10-CM

## 2013-08-30 NOTE — Progress Notes (Signed)
[address]       [place] [address]             [phone]     CARDIOTHORACIC SURGERY OFFICE NOTE  Referring Provider is Ward Givens, MD PCP is Rogelia Boga, MD   HPI:  Patient returns for routine followup status post right video assisted thoracoscopy for drainage of pleural effusion and hemothorax on 01/01/2013. He was last seen here in the office on 02/08/2013 at which time he was recovering uneventfully.  He had originally suffered a fall approximately 6 weeks prior to surgery associated with multiple rib fractures on the right side which subsequently led to the development of a large right pleural effusion associated with a small amount of residual old blood clot.    Over the past 6 months the patient has done well from a respiratory standpoint. He has managed to continue to abstain from any tobacco use.  He denies any shortness of breath. He recently noted that with sudden movements he occasionally will have brief sharp episodes of pain in the right lateral chest. He returns for followup to make certain that nothing is going wrong. He otherwise reports no significant pain nor paresthesias in the right lateral or anterolateral chest wall. He has not had any fevers chills or productive cough. He has no shortness of breath.   Current Outpatient Prescriptions  Medication Sig Dispense Refill  . acetaminophen (TYLENOL) 650 MG CR tablet Take 650 mg by mouth daily.        . calcium carbonate 200 MG capsule Take by mouth daily.        . diphenhydramine-acetaminophen (TYLENOL PM) 25-500 MG TABS Take 1 tablet by mouth at bedtime as needed. For sleep      . donepezil (ARICEPT) 10 MG tablet Take 1 tablet (10 mg total) by mouth daily.  90 tablet  4  . Eszopiclone (ESZOPICLONE) 3 MG TABS Take 1 tablet (3 mg total) by mouth See admin instructions. Takes on Sat, Monday and Wednesday  90 tablet  4  . fluticasone (FLONASE) 50 MCG/ACT nasal spray Place 2 sprays into  the nose daily.      Marland Kitchen gabapentin (NEURONTIN) 600 MG tablet Take 1 tablet (600 mg total) by mouth 3 (three) times daily.  360 tablet  4  . ibuprofen (ADVIL,MOTRIN) 800 MG tablet Take 1 tablet (800 mg total) by mouth at bedtime as needed.  90 tablet  4  . interferon beta-1a (REBIF) 44 MCG/0.5ML injection Inject 44 mcg into the skin See admin instructions. Takes on Saturday, Monday and wednesday      . LORazepam (ATIVAN) 2 MG tablet Take 2 mg by mouth daily.      . multivitamin (METANX) 3-35-2 MG TABS tablet Take 1 tablet by mouth 2 (two) times daily.  180 tablet  4  . nicotine polacrilex (COMMIT) 4 MG lozenge Place 2 mg inside cheek as needed. For nicotine craving      . omeprazole (PRILOSEC) 20 MG capsule Take 20 mg by mouth daily.        Marland Kitchen PROPECIA 1 MG tablet TAKE ONE TABLET BY MOUTH ONE TIME DAILY  90 tablet  4  . sildenafil (VIAGRA) 100 MG tablet Take 1 tablet (100 mg total) by mouth as needed. For sexual activity  30 tablet  4  . traZODone (DESYREL) 50 MG tablet Take 1-2 tablets (50-100 mg total) by mouth See admin instructions. Takes 1 tablet every night plus an  additional tablet on Sunday, Tuesday, Thursday and Friday  180 tablet  4   No current facility-administered medications for this visit.      Physical Exam:   BP 125/77  Pulse 88  Resp 16  Ht 6' (1.829 m)  Wt 170 lb (77.111 kg)  BMI 23.05 kg/m2  SpO2 98%  General:  Well-appearing  Chest:   Clear to auscultation with symmetrical breath sounds  CV:   Regular rate and rhythm  Incisions:  Completely healed, palpation overlying the lateral scar reproduces pressure in the region which the patient notes he occasionally gets sharp twinges of pain  Abdomen:  Soft nontender  Extremities:  Warm and well-perfused  Diagnostic Tests:  CLINICAL DATA: Pleural effusion the right lung since February 2014.  History of empyema. History of smoking.  EXAM:  CHEST 2 VIEW  COMPARISON: 02/08/2013  FINDINGS:  Heart size is normal. There  has been significant improvement in  right lower lobe aeration. No new consolidations or pleural  effusions identified. No evidence for pneumothorax. Visualized  osseous structures have a normal appearance.  IMPRESSION:  No evidence for acute abnormality.  Electronically Signed  By: Rosalie Gums M.D.  On: 08/30/2013 14:33    Impression:  Occasional paroxysms of sharp pain in the right posterior lateral chest associated with strenuous physical exertion which likely corresponded to musculoskeletal pain in the vicinity of the patient's multiple previous rib fractures and subsequent surgery. The patient denies chronic ongoing pain and notes that these episodes occur only sporadically and are usually only associated with strenuous activity or sudden movements. His chest x-ray looked remarkably normal.  Plan:  I have reassured the patient that it is not unusual does have some residual pain and/or paresthesias in the vicinity of previous rib fractures or thoracic surgery which can be exacerbated by physical activity, particularly when it is strenuous. This may or may not continue indefinitely. I do not feel that CT scan or other diagnostic testing as indicated. I've reminded the patient how important will remain for him to continue to abstain from tobacco use. All of his questions been addressed.   Salvatore Decent. Cornelius Moras, MD 08/30/2013 2:51 PM

## 2013-10-14 ENCOUNTER — Other Ambulatory Visit: Payer: Self-pay

## 2013-10-29 ENCOUNTER — Telehealth: Payer: Self-pay | Admitting: Diagnostic Neuroimaging

## 2013-10-29 NOTE — Telephone Encounter (Signed)
Pt came by and dropped off DMV form.   Needs prior to 3 wks.  (just received last week).  Relayed that he has been doing well for the last 10 yrs, stop the DMV process??

## 2013-11-08 ENCOUNTER — Other Ambulatory Visit: Payer: Self-pay | Admitting: Diagnostic Neuroimaging

## 2013-11-08 ENCOUNTER — Other Ambulatory Visit (INDEPENDENT_AMBULATORY_CARE_PROVIDER_SITE_OTHER): Payer: Self-pay

## 2013-11-08 DIAGNOSIS — G35 Multiple sclerosis: Secondary | ICD-10-CM

## 2013-11-08 DIAGNOSIS — Z0289 Encounter for other administrative examinations: Secondary | ICD-10-CM

## 2013-11-08 NOTE — Telephone Encounter (Signed)
I called pt and made appt for him (afternoon only - his request) for 6 mo evaluation and DMV form

## 2013-11-09 LAB — CBC WITH DIFFERENTIAL
Eosinophils Absolute: 0 10*3/uL (ref 0.0–0.4)
Immature Grans (Abs): 0 10*3/uL (ref 0.0–0.1)
Immature Granulocytes: 0 %
MCH: 30 pg (ref 26.6–33.0)
MCHC: 33.5 g/dL (ref 31.5–35.7)
Monocytes Absolute: 0.6 10*3/uL (ref 0.1–0.9)
Neutrophils Relative %: 69 %
Platelets: 173 10*3/uL (ref 150–379)
RDW: 13.8 % (ref 12.3–15.4)

## 2013-11-09 LAB — HEPATIC FUNCTION PANEL
ALT: 24 IU/L (ref 0–44)
AST: 24 IU/L (ref 0–40)
Bilirubin, Direct: 0.16 mg/dL (ref 0.00–0.40)

## 2013-11-16 ENCOUNTER — Encounter: Payer: Self-pay | Admitting: Diagnostic Neuroimaging

## 2013-11-16 ENCOUNTER — Ambulatory Visit (INDEPENDENT_AMBULATORY_CARE_PROVIDER_SITE_OTHER): Payer: BC Managed Care – PPO | Admitting: Diagnostic Neuroimaging

## 2013-11-16 VITALS — BP 122/76 | HR 77 | Temp 97.0°F | Ht 73.0 in | Wt 187.0 lb

## 2013-11-16 DIAGNOSIS — G35 Multiple sclerosis: Secondary | ICD-10-CM

## 2013-11-16 NOTE — Patient Instructions (Signed)
Continue current medications. 
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
## 2013-11-18 ENCOUNTER — Other Ambulatory Visit: Payer: Self-pay

## 2013-11-18 MED ORDER — ESZOPICLONE 3 MG PO TABS: 3.0000 mg | ORAL_TABLET | Freq: Every evening | ORAL | Status: DC

## 2013-11-19 ENCOUNTER — Telehealth: Payer: Self-pay | Admitting: Diagnostic Neuroimaging

## 2013-11-19 NOTE — Telephone Encounter (Signed)
Rx faxed to [facility] at [phone].

## 2013-11-23 ENCOUNTER — Telehealth: Payer: Self-pay | Admitting: Diagnostic Neuroimaging

## 2013-11-23 NOTE — Telephone Encounter (Signed)
I called back, spoke with the patient.  He wants Lunesta Rx changed to Brand Name Only.  I advised him this may not be covered by ins.  He is aware and wants me to submit info to ins to try and get an exception granted.  I told patient we will send the request, however, it is up to the ins if they will pay for meds or not.  I have submitted all required paperwork to ins, pending their response. Patient is aware.  Says he has plenty of meds on hand at this time.

## 2013-11-24 DIAGNOSIS — Z0289 Encounter for other administrative examinations: Secondary | ICD-10-CM

## 2013-12-01 NOTE — Telephone Encounter (Signed)
I called and left VM for patient. Your DMV form was faxed to [facility] on December 15th. It does not look like a copy was mailed to you. If you need a copy, please call back and leave a message for me and I will mail one out to you.

## 2013-12-09 HISTORY — PX: INCISION AND DRAINAGE OF WOUND: SHX1803

## 2014-01-18 IMAGING — CR DG CHEST 1V
1 series · 1 of 1 positions shown · non-contrast
Comparison: 12/31/2012

CLINICAL DATA: A post thoracentesis.

CHEST - 1 VIEW

[w chest pa]
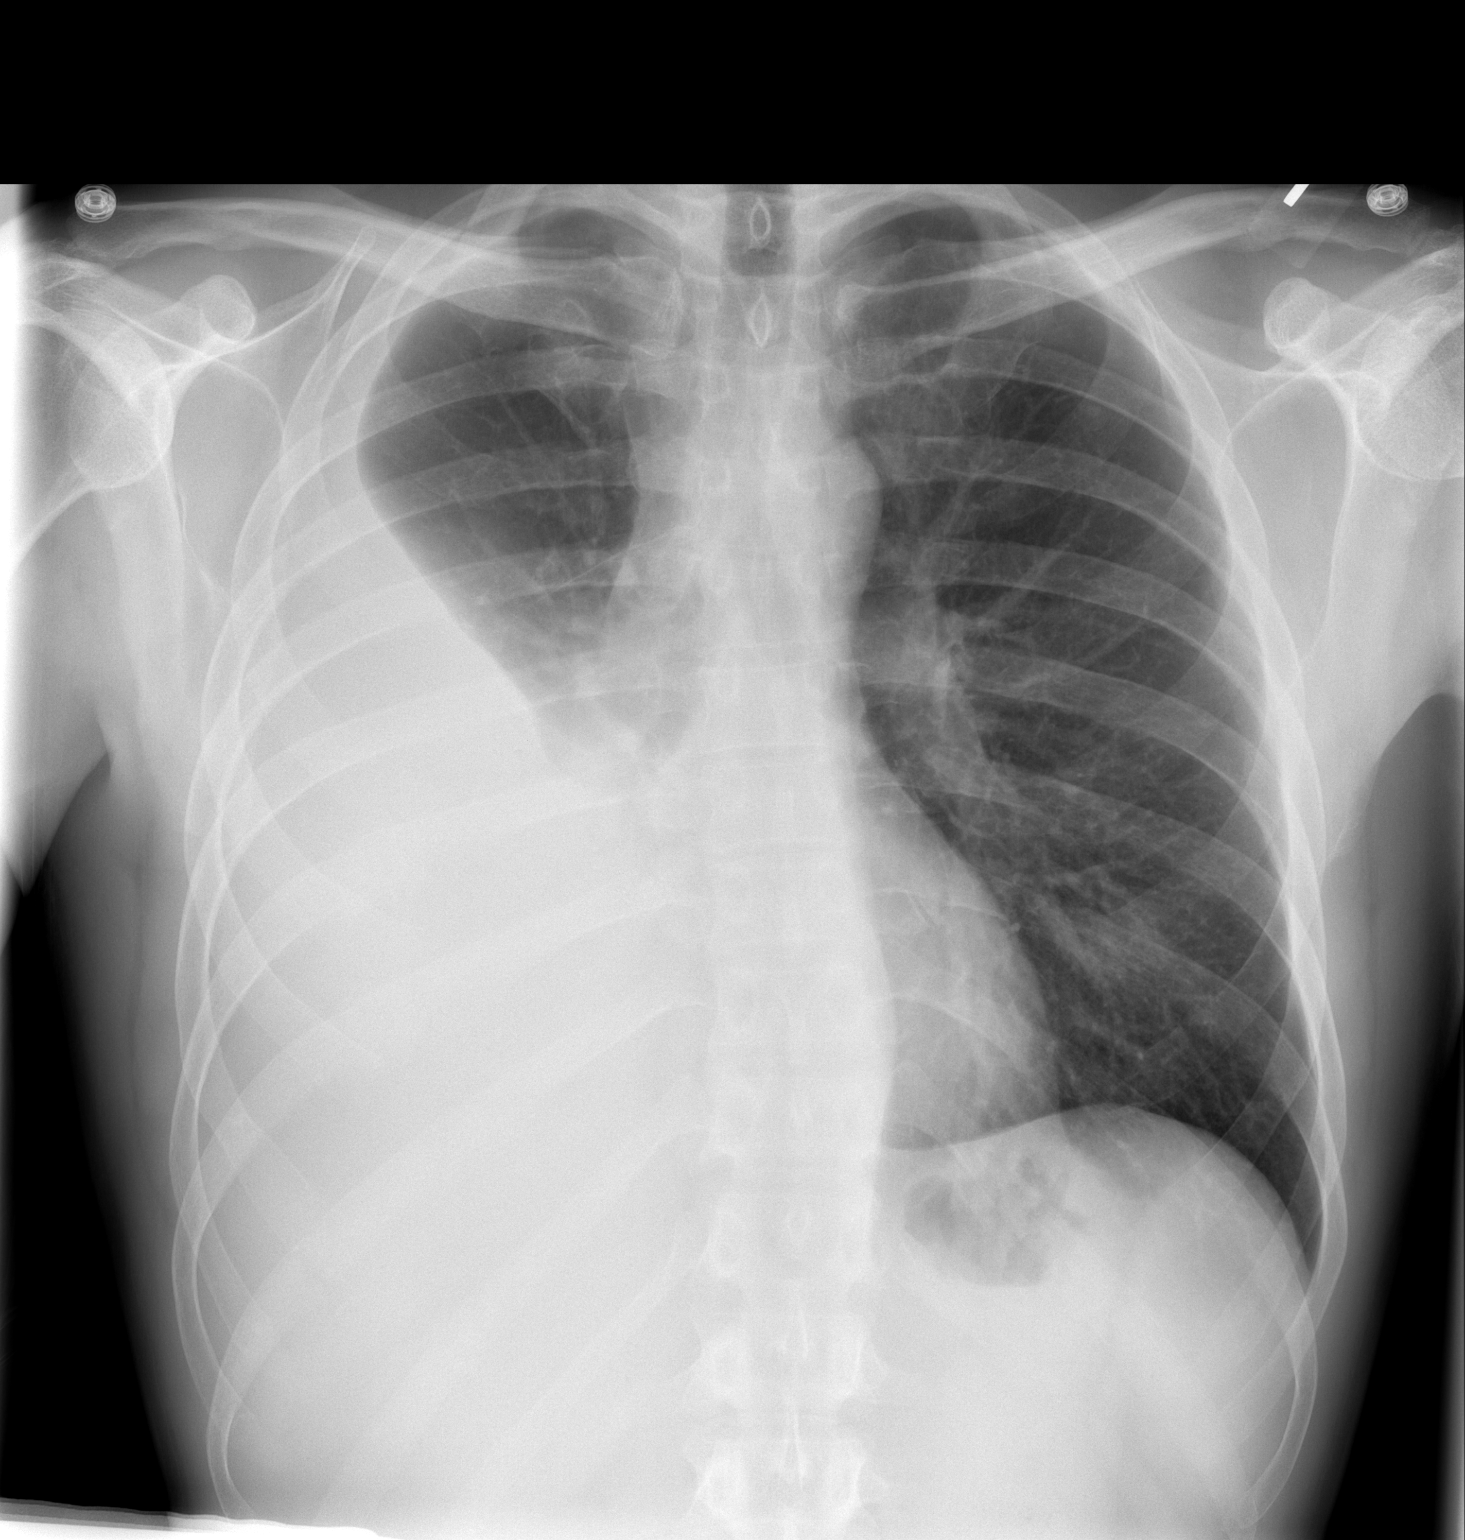

[1 of 1 positions shown; findings below may reference images not displayed]

FINDINGS: Continued large right pleural effusion.  No pneumothorax
following thoracentesis.  Heart is normal size.  Right lower lobe
atelectasis or consolidation.  Left lung is clear.  No effusion on
the left.  No acute bony abnormality.
IMPRESSION: Stable large right pleural effusion with right lower lobe airspace
disease.  No pneumothorax following thoracentesis.

## 2014-03-01 ENCOUNTER — Other Ambulatory Visit: Payer: Self-pay

## 2014-03-01 MED ORDER — INTERFERON BETA-1A 44 MCG/0.5ML ~~LOC~~ SOLN: 44.0000 ug | SUBCUTANEOUS | Status: DC

## 2014-03-08 ENCOUNTER — Other Ambulatory Visit: Payer: Self-pay | Admitting: Neurology

## 2014-03-10 ENCOUNTER — Telehealth: Payer: Self-pay | Admitting: Internal Medicine

## 2014-03-10 MED ORDER — OMEPRAZOLE 20 MG PO CPDR: 20.0000 mg | DELAYED_RELEASE_CAPSULE | Freq: Every day | ORAL | Status: DC

## 2014-03-10 NOTE — Telephone Encounter (Signed)
[facility] (MAIL [facility] - [place], [place] is requesting re-fill on omeprazole (PRILOSEC) 20 MG capsule

## 2014-03-10 NOTE — Telephone Encounter (Signed)
Rx sent 

## 2014-04-28 ENCOUNTER — Telehealth: Payer: Self-pay | Admitting: Diagnostic Neuroimaging

## 2014-04-28 MED ORDER — INTERFERON BETA-1A 44 MCG/0.5ML ~~LOC~~ SOLN: 44.0000 ug | SUBCUTANEOUS | Status: DC

## 2014-04-28 NOTE — Telephone Encounter (Signed)
Rx was previously sent for 90 days; I have resent the Rx again for 90 days.  Called back, got no answer.  LM

## 2014-04-28 NOTE — Telephone Encounter (Signed)
Angel with [facility] called and stated patient called requesting 3 mo supply of interferon beta-1a (REBIF) 44 MCG/0.5ML injection.  She's faxing a request form for Dr Leta Baptist to complete and return.  Patient is currently receiving 30 days at time.  Please call and advise.  Thanks

## 2014-05-17 ENCOUNTER — Ambulatory Visit: Payer: BC Managed Care – PPO | Admitting: Diagnostic Neuroimaging

## 2014-05-19 ENCOUNTER — Other Ambulatory Visit: Payer: Self-pay | Admitting: Internal Medicine

## 2014-05-19 ENCOUNTER — Telehealth: Payer: Self-pay | Admitting: Diagnostic Neuroimaging

## 2014-05-19 NOTE — Telephone Encounter (Signed)
His pharmacy is substituting a generic for Lunesta and it doesn't work as well as the [place] and he needs to the get the [place]. Requesting provider call him back

## 2014-05-23 ENCOUNTER — Other Ambulatory Visit: Payer: Self-pay

## 2014-05-23 MED ORDER — LUNESTA 3 MG PO TABS: 3.0000 mg | ORAL_TABLET | Freq: Every day | ORAL | Status: DC

## 2014-05-23 NOTE — Telephone Encounter (Signed)
I have been out of the office since 06/06.  I will send the refill request for BMN to provider for approval.  I have submitted a new PA request to ins, as they require this with the new year (Last submitted in Dec 2104).  It is still up to the insurance to decide if they will cover Brand Name Johnnye Sima or not.  They will notify both [place] and the patient of the outcome.  I called the patient back, got no answer.

## 2014-05-23 NOTE — Telephone Encounter (Signed)
Called pt and pt stated that he would like for an Rx to be sent in for [facility] stating to dispense as written, so he could get name brand and not generic so his insurance will pay for it. I asked the pt about the phone note on 11/23/13 where Janett Billow, CPhT talked with pt about the same issue and pt stated that he did not recall. Janett Billow, please advise

## 2014-05-24 NOTE — Telephone Encounter (Signed)
Rx signed and faxed.

## 2014-06-23 ENCOUNTER — Other Ambulatory Visit: Payer: Self-pay

## 2014-06-23 MED ORDER — SILDENAFIL CITRATE 100 MG PO TABS: 100.0000 mg | ORAL_TABLET | ORAL | Status: DC | PRN

## 2014-06-23 MED ORDER — METANX 3-90.314-2-35 MG PO CAPS: 2.0000 | ORAL_CAPSULE | Freq: Every day | ORAL | Status: DC

## 2014-06-23 NOTE — Telephone Encounter (Signed)
Former [facility] patient.  He prescribed both of these meds for the patient

## 2014-07-12 ENCOUNTER — Encounter: Payer: Self-pay | Admitting: Diagnostic Neuroimaging

## 2014-07-12 ENCOUNTER — Ambulatory Visit (INDEPENDENT_AMBULATORY_CARE_PROVIDER_SITE_OTHER): Payer: BC Managed Care – PPO | Admitting: Diagnostic Neuroimaging

## 2014-07-12 VITALS — BP 105/72 | HR 73 | Temp 98.5°F | Ht 73.0 in | Wt 192.6 lb

## 2014-07-12 DIAGNOSIS — R413 Other amnesia: Secondary | ICD-10-CM

## 2014-07-12 DIAGNOSIS — G35 Multiple sclerosis: Secondary | ICD-10-CM

## 2014-07-12 DIAGNOSIS — G4700 Insomnia, unspecified: Secondary | ICD-10-CM

## 2014-07-12 MED ORDER — METANX 3-90.314-2-35 MG PO CAPS: 2.0000 | ORAL_CAPSULE | Freq: Every day | ORAL | Status: DC

## 2014-07-12 MED ORDER — GABAPENTIN 800 MG PO TABS: 800.0000 mg | ORAL_TABLET | Freq: Three times a day (TID) | ORAL | Status: DC

## 2014-07-12 MED ORDER — LUNESTA 3 MG PO TABS: 3.0000 mg | ORAL_TABLET | Freq: Every day | ORAL | Status: DC

## 2014-07-12 MED ORDER — DONEPEZIL HCL 10 MG PO TABS: 10.0000 mg | ORAL_TABLET | Freq: Every day | ORAL | Status: DC
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
## 2014-07-12 NOTE — Patient Instructions (Signed)
I will check MRIs and labs.    

## 2014-07-13 LAB — CBC WITH DIFFERENTIAL
BASOS: 0 %
Basophils Absolute: 0 10*3/uL (ref 0.0–0.2)
EOS ABS: 0.1 10*3/uL (ref 0.0–0.4)
Eos: 1 %
HEMATOCRIT: 44 % (ref 37.5–51.0)
HEMOGLOBIN: 14.8 g/dL (ref 12.6–17.7)
IMMATURE GRANS (ABS): 0 10*3/uL (ref 0.0–0.1)
Immature Granulocytes: 0 %
Lymphocytes Absolute: 1.7 10*3/uL (ref 0.7–3.1)
Lymphs: 30 %
MCH: 30.4 pg (ref 26.6–33.0)
MCHC: 33.6 g/dL (ref 31.5–35.7)
MCV: 90 fL (ref 79–97)
MONOS ABS: 0.6 10*3/uL (ref 0.1–0.9)
Monocytes: 12 %
NEUTROS ABS: 3.2 10*3/uL (ref 1.4–7.0)
Neutrophils Relative %: 57 %
Platelets: 211 10*3/uL (ref 150–379)
RBC: 4.87 x10E6/uL (ref 4.14–5.80)
RDW: 13.3 % (ref 12.3–15.4)
WBC: 5.6 10*3/uL (ref 3.4–10.8)

## 2014-07-13 LAB — COMPREHENSIVE METABOLIC PANEL
ALT: 38 IU/L (ref 0–44)
AST: 28 IU/L (ref 0–40)
Albumin/Globulin Ratio: 1.6 (ref 1.1–2.5)
Albumin: 4.3 g/dL (ref 3.5–5.5)
Alkaline Phosphatase: 83 IU/L (ref 39–117)
BUN/Creatinine Ratio: 17 (ref 9–20)
BUN: 21 mg/dL (ref 6–24)
CALCIUM: 9.9 mg/dL (ref 8.7–10.2)
CO2: 29 mmol/L (ref 18–29)
CREATININE: 1.21 mg/dL (ref 0.76–1.27)
Chloride: 100 mmol/L (ref 97–108)
GFR calc Af Amer: 81 mL/min/{1.73_m2} (ref >59)
GFR, EST NON AFRICAN AMERICAN: 70 mL/min/{1.73_m2} (ref >59)
GLOBULIN, TOTAL: 2.7 g/dL (ref 1.5–4.5)
Glucose: 65 mg/dL (ref 65–99)
Potassium: 4.4 mmol/L (ref 3.5–5.2)
SODIUM: 142 mmol/L (ref 134–144)
TOTAL PROTEIN: 7 g/dL (ref 6.0–8.5)
Total Bilirubin: 0.7 mg/dL (ref 0.0–1.2)

## 2014-07-20 ENCOUNTER — Other Ambulatory Visit: Payer: BC Managed Care – PPO

## 2014-07-27 ENCOUNTER — Ambulatory Visit (INDEPENDENT_AMBULATORY_CARE_PROVIDER_SITE_OTHER): Payer: BC Managed Care – PPO

## 2014-07-27 DIAGNOSIS — R413 Other amnesia: Secondary | ICD-10-CM

## 2014-07-27 DIAGNOSIS — G4700 Insomnia, unspecified: Secondary | ICD-10-CM

## 2014-07-27 DIAGNOSIS — G35 Multiple sclerosis: Secondary | ICD-10-CM

## 2014-07-27 MED ORDER — GADOPENTETATE DIMEGLUMINE 469.01 MG/ML IV SOLN: 18.0000 mL | Freq: Once | INTRAVENOUS | Status: AC | PRN

## 2014-07-29 ENCOUNTER — Emergency Department (HOSPITAL_COMMUNITY)
Admission: EM | Admit: 2014-07-29 | Discharge: 2014-07-30 | Disposition: A | Discharge status: Home / Self Care | Payer: BC Managed Care – PPO | Attending: Emergency Medicine | Admitting: Emergency Medicine

## 2014-07-29 ENCOUNTER — Encounter (HOSPITAL_COMMUNITY): Payer: Self-pay | Admitting: Emergency Medicine

## 2014-07-29 ENCOUNTER — Emergency Department (HOSPITAL_COMMUNITY): Payer: BC Managed Care – PPO

## 2014-07-29 DIAGNOSIS — Z8709 Personal history of other diseases of the respiratory system: Secondary | ICD-10-CM | POA: Diagnosis not present

## 2014-07-29 DIAGNOSIS — Z8669 Personal history of other diseases of the nervous system and sense organs: Secondary | ICD-10-CM | POA: Diagnosis not present

## 2014-07-29 DIAGNOSIS — R6510 Systemic inflammatory response syndrome (SIRS) of non-infectious origin without acute organ dysfunction: Secondary | ICD-10-CM | POA: Diagnosis not present

## 2014-07-29 DIAGNOSIS — Z87448 Personal history of other diseases of urinary system: Secondary | ICD-10-CM | POA: Insufficient documentation

## 2014-07-29 DIAGNOSIS — Z880 Allergy status to penicillin: Secondary | ICD-10-CM | POA: Insufficient documentation

## 2014-07-29 DIAGNOSIS — L0231 Cutaneous abscess of buttock: Secondary | ICD-10-CM | POA: Diagnosis not present

## 2014-07-29 DIAGNOSIS — Z87828 Personal history of other (healed) physical injury and trauma: Secondary | ICD-10-CM | POA: Diagnosis not present

## 2014-07-29 DIAGNOSIS — R509 Fever, unspecified: Secondary | ICD-10-CM | POA: Insufficient documentation

## 2014-07-29 DIAGNOSIS — Z79899 Other long term (current) drug therapy: Secondary | ICD-10-CM | POA: Insufficient documentation

## 2014-07-29 DIAGNOSIS — R5381 Other malaise: Secondary | ICD-10-CM | POA: Insufficient documentation

## 2014-07-29 DIAGNOSIS — Z87891 Personal history of nicotine dependence: Secondary | ICD-10-CM | POA: Insufficient documentation

## 2014-07-29 DIAGNOSIS — L03317 Cellulitis of buttock: Secondary | ICD-10-CM

## 2014-07-29 DIAGNOSIS — G4700 Insomnia, unspecified: Secondary | ICD-10-CM | POA: Insufficient documentation

## 2014-07-29 DIAGNOSIS — R000 Tachycardia, unspecified: Secondary | ICD-10-CM | POA: Diagnosis not present

## 2014-07-29 DIAGNOSIS — Z8781 Personal history of (healed) traumatic fracture: Secondary | ICD-10-CM | POA: Diagnosis not present

## 2014-07-29 DIAGNOSIS — K219 Gastro-esophageal reflux disease without esophagitis: Secondary | ICD-10-CM | POA: Diagnosis not present

## 2014-07-29 DIAGNOSIS — R5383 Other fatigue: Secondary | ICD-10-CM

## 2014-07-29 LAB — CBC WITH DIFFERENTIAL/PLATELET
BASOS ABS: 0 10*3/uL (ref 0.0–0.1)
Basophils Relative: 0 % (ref 0–1)
EOS ABS: 0 10*3/uL (ref 0.0–0.7)
EOS PCT: 0 % (ref 0–5)
HCT: 42.6 % (ref 39.0–52.0)
Hemoglobin: 14.5 g/dL (ref 13.0–17.0)
LYMPHS PCT: 8 % — AB (ref 12–46)
Lymphs Abs: 1.1 10*3/uL (ref 0.7–4.0)
MCH: 30.4 pg (ref 26.0–34.0)
MCHC: 34 g/dL (ref 30.0–36.0)
MCV: 89.3 fL (ref 78.0–100.0)
Monocytes Absolute: 1.1 10*3/uL — ABNORMAL HIGH (ref 0.1–1.0)
Monocytes Relative: 8 % (ref 3–12)
NEUTROS PCT: 84 % — AB (ref 43–77)
Neutro Abs: 10.9 10*3/uL — ABNORMAL HIGH (ref 1.7–7.7)
PLATELETS: 227 10*3/uL (ref 150–400)
RBC: 4.77 MIL/uL (ref 4.22–5.81)
RDW: 12.6 % (ref 11.5–15.5)
WBC: 13.1 10*3/uL — ABNORMAL HIGH (ref 4.0–10.5)

## 2014-07-29 LAB — COMPREHENSIVE METABOLIC PANEL
ALT: 21 U/L (ref 0–53)
AST: 21 U/L (ref 0–37)
Albumin: 3.9 g/dL (ref 3.5–5.2)
Alkaline Phosphatase: 85 U/L (ref 39–117)
Anion gap: 15 (ref 5–15)
BUN: 25 mg/dL — ABNORMAL HIGH (ref 6–23)
CO2: 24 meq/L (ref 19–32)
Calcium: 9.6 mg/dL (ref 8.4–10.5)
Chloride: 96 mEq/L (ref 96–112)
Creatinine, Ser: 1.35 mg/dL (ref 0.50–1.35)
GFR calc Af Amer: 70 mL/min — ABNORMAL LOW (ref >90)
GFR calc non Af Amer: 61 mL/min — ABNORMAL LOW (ref >90)
Glucose, Bld: 150 mg/dL — ABNORMAL HIGH (ref 70–99)
Potassium: 3.8 mEq/L (ref 3.7–5.3)
SODIUM: 135 meq/L — AB (ref 137–147)
TOTAL PROTEIN: 7.5 g/dL (ref 6.0–8.3)
Total Bilirubin: 1.3 mg/dL — ABNORMAL HIGH (ref 0.3–1.2)

## 2014-07-29 LAB — I-STAT CG4 LACTIC ACID, ED: Lactic Acid, Venous: 1.99 mmol/L (ref 0.5–2.2)

## 2014-07-29 MED ORDER — ACETAMINOPHEN 325 MG PO TABS
650.0000 mg | ORAL_TABLET | Freq: Four times a day (QID) | ORAL | Status: DC | PRN
  Administered 2014-07-29: 650 mg via ORAL
  Filled 2014-07-29: qty 2

## 2014-07-29 MED ORDER — SODIUM CHLORIDE 0.9 % IV BOLUS (SEPSIS)
2000.0000 mL | Freq: Once | INTRAVENOUS | Status: AC
  Administered 2014-07-29: 2000 mL via INTRAVENOUS

## 2014-07-29 MED ORDER — SODIUM CHLORIDE 0.9 % IV BOLUS (SEPSIS)
1000.0000 mL | Freq: Once | INTRAVENOUS | Status: AC
  Administered 2014-07-29: 1000 mL via INTRAVENOUS

## 2014-07-29 MED ORDER — CLINDAMYCIN PHOSPHATE 600 MG/50ML IV SOLN
600.0000 mg | Freq: Once | INTRAVENOUS | Status: AC
  Administered 2014-07-29: 600 mg via INTRAVENOUS
  Filled 2014-07-29: qty 50

## 2014-07-29 MED ORDER — LORAZEPAM 1 MG PO TABS
1.0000 mg | ORAL_TABLET | Freq: Once | ORAL | Status: AC
  Administered 2014-07-29: 1 mg via ORAL
  Filled 2014-07-29: qty 1

## 2014-07-29 NOTE — ED Notes (Signed)
Presents with redness to left buttock began last night associated with pain-pt is febrile, drowsy, HX of MS. TEmp 101.6, HR 131, SAts 93%, BP 124/70.  Pt denies SOB and nausea. He is alert and oriented, drowsy.

## 2014-07-29 NOTE — ED Provider Notes (Signed)
CSN: [id number]     Arrival date & time 07/29/14  2014 History   First MD Initiated Contact with Patient 07/29/14 2112     Chief Complaint  Patient presents with  . Fever     (Consider location/radiation/quality/duration/timing/severity/associated sxs/prior Treatment) Patient is a 48 y.o. male presenting with general illness. The history is provided by the patient and the spouse.  Illness Severity:  Moderate Onset quality:  Sudden Duration:  2 days Timing:  Constant Progression:  Worsening Chronicity:  Recurrent Associated symptoms: fatigue, fever and myalgias   Associated symptoms: no abdominal pain, no chest pain, no congestion, no cough, no diarrhea, no headaches, no nausea, no rash, no shortness of breath and no vomiting    47yo M with a chief complaint of fever. This started approximately 2 days ago. Patient has a history of MS and takes intragluteal interferon shots. Patient with some redness to the area has been rapidly expanding. Patient with some increased tiredness as well. Arrived to the ED febrile and tachycardic. Has started Augmentin 2 days ago for this.  Past Medical History  Diagnosis Date  . ALLERGIC RHINITIS 03/27/2010  . GERD 03/27/2010  . HYPERLIPIDEMIA 03/27/2010  . MULTIPLE SCLEROSIS, RELAPSING/REMITTING 03/27/2010  . NICOTINE ADDICTION 05/24/2010  . TMJ (dislocation of temporomandibular joint)   . Insomnia   . Neurogenic bladder   . Rib fracture 11/2012  . Pleural effusion on right 12/31/2012    Secondary to rib fractures  . Fracture, ribs 12/31/2012    Non-displaced fracture of right 8th-11th ribs  . Gait disorder    Past Surgical History  Procedure Laterality Date  . Ankle surgery    . Foot surgery    . Pleural effusion drainage  01/01/2013    Procedure: DRAINAGE OF PLEURAL EFFUSION;  Surgeon: Rexene Alberts, MD;  Location: [facility];  Service: Thoracic;  Laterality: Right;   Family History  Problem Relation Age of Onset  . Cancer Father     lung ca  .  Heart failure Mother    History  Substance Use Topics  . Smoking status: Former Smoker -- 0.50 packs/day for 20 years    Types: Cigarettes    Quit date: 10/16/2012  . Smokeless tobacco: Former Systems developer    Quit date: 10/31/2012  . Alcohol Use: Yes     Comment: occasionally    Review of Systems  Constitutional: Positive for fever and fatigue. Negative for chills.  HENT: Negative for congestion and facial swelling.   Eyes: Negative for discharge and visual disturbance.  Respiratory: Negative for cough and shortness of breath.   Cardiovascular: Negative for chest pain and palpitations.  Gastrointestinal: Negative for nausea, vomiting, abdominal pain and diarrhea.  Musculoskeletal: Positive for myalgias. Negative for arthralgias.  Skin: Negative for color change and rash.  Neurological: Negative for tremors, syncope and headaches.  Psychiatric/Behavioral: Negative for confusion and dysphoric mood.      Allergies  Amoxicillin; Ampicillin; and Morphine  Home Medications   Prior to Admission medications   Medication Sig Start Date End Date Taking? Authorizing Provider  acetaminophen (TYLENOL) 650 MG CR tablet Take 650 mg by mouth daily.     Yes Historical Provider, MD  calcium carbonate 200 MG capsule Take by mouth daily.     Yes Historical Provider, MD  diphenhydramine-acetaminophen (TYLENOL PM) 25-500 MG TABS Take 1 tablet by mouth at bedtime as needed. For sleep   Yes Historical Provider, MD  donepezil (ARICEPT) 10 MG tablet Take 1 tablet (10 mg total)  by mouth daily. 07/12/14  Yes Penni Bombard, MD  Eszopiclone (ESZOPICLONE) 3 MG TABS Take 3 mg by mouth 3 (three) times a week. LUNESTA= Saturday, Monday, Wednesday. Take immediately before bedtime   Yes Historical Provider, MD  fluticasone (FLONASE) 50 MCG/ACT nasal spray Place 2 sprays into the nose daily as needed for allergies.  04/07/13  Yes Historical Provider, MD  gabapentin (NEURONTIN) 600 MG tablet Take 600 mg by mouth 3  (three) times daily.   Yes Historical Provider, MD  ibuprofen (ADVIL,MOTRIN) 800 MG tablet Take 800 mg by mouth 3 (three) times a week. Saturday, Monday, and Wednesday   Yes Historical Provider, MD  interferon beta-1a (REBIF) 44 MCG/0.5ML injection Inject 44 mcg into the skin 3 (three) times a week. Saturday, Monday, and wednesday   Yes Historical Provider, MD  L-Methylfolate-Algae-B12-B6 Glade Stanford) 3-90.314-2-35 MG CAPS Take 2 capsules by mouth daily. 07/12/14  Yes Penni Bombard, MD  LORazepam (ATIVAN) 2 MG tablet Take 2 mg by mouth daily.   Yes Historical Provider, MD  Misc Natural Products (FOCUSED MIND PO) Take 1 tablet by mouth daily.   Yes Historical Provider, MD  Multiple Vitamin (MULTIVITAMIN WITH MINERALS) TABS tablet Take 1 tablet by mouth daily.   Yes Historical Provider, MD  nicotine polacrilex (COMMIT) 4 MG lozenge Place 2 mg inside cheek as needed. For nicotine craving   Yes Historical Provider, MD  omeprazole (PRILOSEC) 20 MG capsule Take 1 capsule (20 mg total) by mouth daily. 03/10/14  Yes Marletta Lor, MD  PROPECIA 1 MG tablet Take one tablet by mouth one time daily 05/19/14  Yes Marletta Lor, MD  sildenafil (VIAGRA) 100 MG tablet Take 150-200 mg by mouth as needed for erectile dysfunction. For sexual activity 06/23/14  Yes Penni Bombard, MD  traZODone (DESYREL) 50 MG tablet Take 50-100 mg by mouth See admin instructions. Take 50mg  tablet at bedtime on Monday, Wednesday, Saturday.  Take 100mg  (2 tablets) at bedtime on Sunday, Tuesday, Thursday, and friday   Yes Historical Provider, MD  clindamycin (CLEOCIN) 150 MG capsule Take 3 capsules (450 mg total) by mouth 3 (three) times daily. 07/29/14 08/06/14  Deno Etienne, MD  gabapentin (NEURONTIN) 800 MG tablet Take 1 tablet (800 mg total) by mouth 3 (three) times daily. 07/12/14   Penni Bombard, MD   BP 126/78  Pulse 112  Temp(Src) 100.6 F (38.1 C) (Oral)  Resp 17  SpO2 99% Physical Exam  Constitutional: He is  oriented to person, place, and time. He appears well-developed and well-nourished.  HENT:  Head: Normocephalic and atraumatic.  Eyes: EOM are normal. Pupils are equal, round, and reactive to light.  Neck: Normal range of motion. Neck supple. No JVD present.  Cardiovascular: Regular rhythm.  Exam reveals no gallop and no friction rub.   No murmur heard. tachycardia  Pulmonary/Chest: No respiratory distress. He has no wheezes.  Abdominal: He exhibits no distension. There is no rebound and no guarding.  Musculoskeletal: Normal range of motion.       Back:  Neurological: He is alert and oriented to person, place, and time.  Skin: No rash noted. No pallor.  Psychiatric: He has a normal mood and affect. His behavior is normal.    ED Course  INCISION AND DRAINAGE Date/Time: 07/29/2014 11:03 PM Performed by: Tyrone Nine Amarii Bordas Authorized by: Deno Etienne Consent: Verbal consent obtained. Risks and benefits: risks, benefits and alternatives were discussed Consent given by: patient Patient understanding: patient states understanding of the procedure  being performed Patient consent: the patient's understanding of the procedure matches consent given Patient identity confirmed: hospital-assigned identification number Time out: Immediately prior to procedure a "time out" was called to verify the correct patient, procedure, equipment, support staff and site/side marked as required. Type: abscess Body area: lower extremity Location details: left buttock Anesthesia: local infiltration Local anesthetic: lidocaine 1% with epinephrine Anesthetic total: 10 ml Patient sedated: no Needle gauge: 18 Complexity: simple Drainage amount: scant Patient tolerance: Patient tolerated the procedure well with no immediate complications.   (including critical care time) Labs Review Labs Reviewed  CBC WITH DIFFERENTIAL - Abnormal; Notable for the following:    WBC 13.1 (*)    Neutrophils Relative % 84 (*)    Neutro  Abs 10.9 (*)    Lymphocytes Relative 8 (*)    Monocytes Absolute 1.1 (*)    All other components within normal limits  COMPREHENSIVE METABOLIC PANEL - Abnormal; Notable for the following:    Sodium 135 (*)    Glucose, Bld 150 (*)    BUN 25 (*)    Total Bilirubin 1.3 (*)    GFR calc non Af Amer 61 (*)    GFR calc Af Amer 70 (*)    All other components within normal limits  CULTURE, BLOOD (ROUTINE X 2)  CULTURE, BLOOD (ROUTINE X 2)  URINE CULTURE  URINALYSIS, ROUTINE W REFLEX MICROSCOPIC  I-STAT CG4 LACTIC ACID, ED  I-STAT CG4 LACTIC ACID, ED    Imaging Review Dg Chest Port 1 View  (if Code Sepsis Called)  07/29/2014   CLINICAL DATA:  Fever  EXAM: PORTABLE CHEST - 1 VIEW  COMPARISON:  08/30/2013  FINDINGS: Normal heart size and mediastinal contours. Minimal scar at the right base. No acute infiltrate or edema. No effusion or pneumothorax. No acute osseous findings.  IMPRESSION: Negative for pneumonia.   Electronically Signed   By: Jorje Guild M.D.   On: 07/29/2014 22:44     EKG Interpretation None      MDM   Final diagnoses:  Cellulitis of buttock  SIRS (systemic inflammatory response syndrome)    48 yo M with a chief complaint of fever. Tachycardic on arrival as well as febrile. Patient with intact mental status not hypoxic. Large area. Edema and induration. Small area of loculiated fluid.  Needle aspiration attempted and unsuccessful.  Normal lactic acid.   Given clinda 600mg  IV.  Tachycardia resolved, patient feeling much better.  Feel unlikely urine source, discussed options with family, would like to go home, will return for worsening symptoms.   12:04 AM:  I have discussed the diagnosis/risks/treatment options with the patient and family and believe the pt to be eligible for discharge home to follow-up with PCP. We also discussed returning to the ED immediately if new or worsening sx occur. We discussed the sx which are most concerning (e.g., worsening symptoms,  confusion) that necessitate immediate return. Medications administered to the patient during their visit and any new prescriptions provided to the patient are listed below.  Medications given during this visit Medications  acetaminophen (TYLENOL) tablet 650 mg (650 mg Oral Given 07/29/14 2147)  sodium chloride 0.9 % bolus 2,000 mL (0 mLs Intravenous Stopped 07/29/14 2309)  clindamycin (CLEOCIN) IVPB 600 mg (0 mg Intravenous Stopped 07/29/14 2346)  sodium chloride 0.9 % bolus 1,000 mL (1,000 mLs Intravenous New Bag/Given 07/29/14 2334)  LORazepam (ATIVAN) tablet 1 mg (1 mg Oral Given 07/29/14 2345)    New Prescriptions   CLINDAMYCIN (CLEOCIN) 150  MG CAPSULE    Take 3 capsules (450 mg total) by mouth 3 (three) times daily.     Deno Etienne, MD 07/30/14 [phone]

## 2014-07-30 ENCOUNTER — Encounter (HOSPITAL_COMMUNITY): Payer: Self-pay | Admitting: Emergency Medicine

## 2014-07-30 DIAGNOSIS — Z87891 Personal history of nicotine dependence: Secondary | ICD-10-CM | POA: Diagnosis not present

## 2014-07-30 DIAGNOSIS — R509 Fever, unspecified: Secondary | ICD-10-CM | POA: Diagnosis present

## 2014-07-30 LAB — CBC WITH DIFFERENTIAL/PLATELET
BASOS PCT: 0 % (ref 0–1)
Basophils Absolute: 0 10*3/uL (ref 0.0–0.1)
EOS PCT: 1 % (ref 0–5)
Eosinophils Absolute: 0.1 10*3/uL (ref 0.0–0.7)
HEMATOCRIT: 36.5 % — AB (ref 39.0–52.0)
Hemoglobin: 12.3 g/dL — ABNORMAL LOW (ref 13.0–17.0)
Lymphocytes Relative: 9 % — ABNORMAL LOW (ref 12–46)
Lymphs Abs: 1.1 10*3/uL (ref 0.7–4.0)
MCH: 29.9 pg (ref 26.0–34.0)
MCHC: 33.7 g/dL (ref 30.0–36.0)
MCV: 88.8 fL (ref 78.0–100.0)
MONO ABS: 1.1 10*3/uL — AB (ref 0.1–1.0)
Monocytes Relative: 10 % (ref 3–12)
Neutro Abs: 9.2 10*3/uL — ABNORMAL HIGH (ref 1.7–7.7)
Neutrophils Relative %: 80 % — ABNORMAL HIGH (ref 43–77)
Platelets: 203 10*3/uL (ref 150–400)
RBC: 4.11 MIL/uL — ABNORMAL LOW (ref 4.22–5.81)
RDW: 12.7 % (ref 11.5–15.5)
WBC: 11.5 10*3/uL — ABNORMAL HIGH (ref 4.0–10.5)

## 2014-07-30 LAB — I-STAT CHEM 8, ED
BUN: 17 mg/dL (ref 6–23)
CALCIUM ION: 1.1 mmol/L — AB (ref 1.12–1.23)
Chloride: 104 mEq/L (ref 96–112)
Creatinine, Ser: 1 mg/dL (ref 0.50–1.35)
Glucose, Bld: 113 mg/dL — ABNORMAL HIGH (ref 70–99)
HEMATOCRIT: 39 % (ref 39.0–52.0)
Hemoglobin: 13.3 g/dL (ref 13.0–17.0)
Potassium: 3.8 mEq/L (ref 3.7–5.3)
Sodium: 135 mEq/L — ABNORMAL LOW (ref 137–147)
TCO2: 27 mmol/L (ref 0–100)

## 2014-07-30 LAB — I-STAT CG4 LACTIC ACID, ED: Lactic Acid, Venous: 0.9 mmol/L (ref 0.5–2.2)

## 2014-07-30 MED ORDER — CLINDAMYCIN HCL 150 MG PO CAPS: 450.0000 mg | ORAL_CAPSULE | Freq: Three times a day (TID) | ORAL | Status: DC

## 2014-07-30 NOTE — ED Notes (Signed)
Patient seen last night for infection of buttock, was given antibiotics and has been taking them today and continues with fever after tylenol.  Patient has been on clindamycin since yesterday.

## 2014-07-30 NOTE — ED Provider Notes (Signed)
This patient was seen in coordination with the resident physician. The documentation accurately reflects the patient's encounter. On my exam, patient was in no distress, though he continued to complain of pain and that he left buttock. On exam the patient had a indurated area of erythema, and the ultrasound there was a hypoechoic spot concerning for abscess. As available for all relevant portions of the procedure, supervised the procedure.   Carmin Muskrat, MD 07/30/14 2134

## 2014-07-30 NOTE — ED Notes (Signed)
Patient and wife unsure if he had a watch on PTA.  No watch found in the room or in the linens.

## 2014-07-30 NOTE — Discharge Instructions (Signed)
Warm compress 3 times a day.  Have the wound rechecked in two days.  Return for worsening symptoms, drink plenty of fluids.  Cellulitis Cellulitis is an infection of the skin and the tissue beneath it. The infected area is usually red and tender. Cellulitis occurs most often in the arms and lower legs.  CAUSES  Cellulitis is caused by bacteria that enter the skin through cracks or cuts in the skin. The most common types of bacteria that cause cellulitis are staphylococci and streptococci. SIGNS AND SYMPTOMS   Redness and warmth.  Swelling.  Tenderness or pain.  Fever. DIAGNOSIS  Your health care provider can usually determine what is wrong based on a physical exam. Blood tests may also be done. TREATMENT  Treatment usually involves taking an antibiotic medicine. HOME CARE INSTRUCTIONS   Take your antibiotic medicine as directed by your health care provider. Finish the antibiotic even if you start to feel better.  Keep the infected arm or leg elevated to reduce swelling.  Apply a warm cloth to the affected area up to 4 times per day to relieve pain.  Take medicines only as directed by your health care provider.  Keep all follow-up visits as directed by your health care provider. SEEK MEDICAL CARE IF:   You notice red streaks coming from the infected area.  Your red area gets larger or turns dark in color.  Your bone or joint underneath the infected area becomes painful after the skin has healed.  Your infection returns in the same area or another area.  You notice a swollen bump in the infected area.  You develop new symptoms.  You have a fever. SEEK IMMEDIATE MEDICAL CARE IF:   You feel very sleepy.  You develop vomiting or diarrhea.  You have a general ill feeling (malaise) with muscle aches and pains. MAKE SURE YOU:   Understand these instructions.  Will watch your condition.  Will get help right away if you are not doing well or get worse. Document  Released: 09/04/2005 Document Revised: 04/11/2014 Document Reviewed: 02/10/2012 [facility] Patient Information 2015 [place], [place]. This information is not intended to replace advice given to you by your health care provider. Make sure you discuss any questions you have with your health care provider.

## 2014-07-30 NOTE — ED Notes (Signed)
I Stat Lactic Acid results shown to Dr. Rosalyn Gess

## 2014-07-31 ENCOUNTER — Emergency Department (HOSPITAL_COMMUNITY)
Admission: EM | Admit: 2014-07-31 | Discharge: 2014-07-31 | Discharge status: Against Medical Advice | Payer: BC Managed Care – PPO | Attending: Emergency Medicine | Admitting: Emergency Medicine

## 2014-07-31 NOTE — ED Notes (Signed)
Patient left, not wanting to wait any longer.

## 2014-08-01 ENCOUNTER — Encounter: Payer: Self-pay | Admitting: Internal Medicine

## 2014-08-01 ENCOUNTER — Ambulatory Visit (INDEPENDENT_AMBULATORY_CARE_PROVIDER_SITE_OTHER): Payer: BC Managed Care – PPO | Admitting: Internal Medicine

## 2014-08-01 VITALS — BP 130/86 | HR 91 | Temp 100.2°F | Resp 20 | Ht 72.0 in | Wt 192.0 lb

## 2014-08-01 DIAGNOSIS — G35 Multiple sclerosis: Secondary | ICD-10-CM

## 2014-08-01 MED ORDER — CLINDAMYCIN HCL 150 MG PO CAPS: 450.0000 mg | ORAL_CAPSULE | Freq: Three times a day (TID) | ORAL | Status: DC

## 2014-08-01 MED ORDER — CLINDAMYCIN HCL 150 MG PO CAPS: 450.0000 mg | ORAL_CAPSULE | Freq: Three times a day (TID) | ORAL | Status: AC

## 2014-08-01 NOTE — Progress Notes (Signed)
Pre visit review using our clinic review tool, if applicable. No additional management support is needed unless otherwise documented below in the visit note. 

## 2014-08-01 NOTE — Progress Notes (Signed)
Subjective:    Patient ID: [name], male    DOB: 27-Oct-1966, 48 y.o.   MRN: [id number]  HPI 48 year old patient who is seen today in followup.  The patient has a history of MS and has been on chronic interferon treatment.  His last injection was to the left buttock area 7 days ago.  He was seen in the ED over the weekend it to an extensive cellulitis involving the left buttock area.  ED records reviewed.  Evaluation included a soft tissue ultrasound and an attempt at ultrasound-guided aspiration of possible abscess.   Tap was dry.  He does have a remote history of penicillin allergy and the patient has been treated with clindamycin, which he has tolerated well.  His wife, who is a podiatrist, treated the patient with Augmentin; he tolerated two  dosages without difficulty. The patient has improved, but still having some significant local discomfort.  No further fever, chills, or tachycardia.  His appetite has normalized.  His white count improving.  Past Medical History  Diagnosis Date  . ALLERGIC RHINITIS 03/27/2010  . GERD 03/27/2010  . HYPERLIPIDEMIA 03/27/2010  . MULTIPLE SCLEROSIS, RELAPSING/REMITTING 03/27/2010  . NICOTINE ADDICTION 05/24/2010  . TMJ (dislocation of temporomandibular joint)   . Insomnia   . Neurogenic bladder   . Rib fracture 11/2012  . Pleural effusion on right 12/31/2012    Secondary to rib fractures  . Fracture, ribs 12/31/2012    Non-displaced fracture of right 8th-11th ribs  . Gait disorder     History   Social History  . Marital Status: Married    Spouse Name: Jana Half    Number of Children: 0  . Years of Education: Law   Occupational History  . Self-Employed     Property Management  .     Social History Main Topics  . Smoking status: Former Smoker -- 0.50 packs/day for 20 years    Types: Cigarettes    Quit date: 10/16/2012  . Smokeless tobacco: Former Systems developer    Quit date: 10/31/2012  . Alcohol Use: Yes     Comment: occasionally  . Drug Use:  No  . Sexual Activity: Not on file   Other Topics Concern  . Not on file   Social History Narrative   Pt lives at home with spouse.    Caffeine Use: none    Past Surgical History  Procedure Laterality Date  . Ankle surgery    . Foot surgery    . Pleural effusion drainage  01/01/2013    Procedure: DRAINAGE OF PLEURAL EFFUSION;  Surgeon: Rexene Alberts, MD;  Location: [facility] OR;  Service: Thoracic;  Laterality: Right;    Family History  Problem Relation Age of Onset  . Cancer Father     lung ca  . Heart failure Mother     Allergies  Allergen Reactions  . Amoxicillin Swelling  . Ampicillin Swelling  . Morphine Swelling    Current Outpatient Prescriptions on File Prior to Visit  Medication Sig Dispense Refill  . acetaminophen (TYLENOL) 650 MG CR tablet Take 650 mg by mouth daily.        . calcium carbonate 200 MG capsule Take by mouth daily.        . diphenhydramine-acetaminophen (TYLENOL PM) 25-500 MG TABS Take 1 tablet by mouth at bedtime as needed. For sleep      . donepezil (ARICEPT) 10 MG tablet Take 1 tablet (10 mg total) by mouth daily.  90 tablet  4  .  Eszopiclone (ESZOPICLONE) 3 MG TABS Take 3 mg by mouth 3 (three) times a week. LUNESTA= Saturday, Monday, Wednesday. Take immediately before bedtime      . fluticasone (FLONASE) 50 MCG/ACT nasal spray Place 2 sprays into the nose daily as needed for allergies.       Marland Kitchen gabapentin (NEURONTIN) 600 MG tablet Take 600 mg by mouth 3 (three) times daily.      Marland Kitchen ibuprofen (ADVIL,MOTRIN) 800 MG tablet Take 800 mg by mouth 3 (three) times a week. Saturday, Monday, and Wednesday      . interferon beta-1a (REBIF) 44 MCG/0.5ML injection Inject 44 mcg into the skin 3 (three) times a week. Saturday, Monday, and wednesday      . L-Methylfolate-Algae-B12-B6 (METANX) 3-90.314-2-35 MG CAPS Take 2 capsules by mouth daily.  180 capsule  4  . LORazepam (ATIVAN) 2 MG tablet Take 2 mg by mouth daily.      . Misc Natural Products (FOCUSED MIND  PO) Take 1 tablet by mouth daily.      . Multiple Vitamin (MULTIVITAMIN WITH MINERALS) TABS tablet Take 1 tablet by mouth daily.      . nicotine polacrilex (COMMIT) 4 MG lozenge Place 2 mg inside cheek as needed. For nicotine craving      . omeprazole (PRILOSEC) 20 MG capsule Take 1 capsule (20 mg total) by mouth daily.  90 capsule  0  . PROPECIA 1 MG tablet Take one tablet by mouth one time daily  90 tablet  0  . sildenafil (VIAGRA) 100 MG tablet Take 150-200 mg by mouth as needed for erectile dysfunction. For sexual activity      . traZODone (DESYREL) 50 MG tablet Take 50-100 mg by mouth See admin instructions. Take 50mg  tablet at bedtime on Monday, Wednesday, Saturday.  Take 100mg  (2 tablets) at bedtime on Sunday, Tuesday, Thursday, and friday       No current facility-administered medications on file prior to visit.    BP 130/86  Pulse 91  Temp(Src) 100.2 F (37.9 C) (Oral)  Resp 20  Ht 6' (1.829 m)  Wt 192 lb (87.091 kg)  BMI 26.03 kg/m2  SpO2 98%       Review of Systems  Constitutional: Positive for chills, activity change, appetite change and fatigue. Negative for fever.  HENT: Negative for congestion, dental problem, ear pain, hearing loss, sore throat, tinnitus, trouble swallowing and voice change.   Eyes: Negative for pain, discharge and visual disturbance.  Respiratory: Negative for cough, chest tightness, wheezing and stridor.   Cardiovascular: Negative for chest pain, palpitations and leg swelling.  Gastrointestinal: Negative for nausea, vomiting, abdominal pain, diarrhea, constipation, blood in stool and abdominal distention.  Genitourinary: Negative for urgency, hematuria, flank pain, discharge, difficulty urinating and genital sores.  Musculoskeletal: Negative for arthralgias, back pain, gait problem, joint swelling, myalgias and neck stiffness.  Skin: Positive for wound. Negative for rash.  Neurological: Negative for dizziness, syncope, speech difficulty,  weakness, numbness and headaches.  Hematological: Negative for adenopathy. Does not bruise/bleed easily.  Psychiatric/Behavioral: Negative for behavioral problems and dysphoric mood. The patient is not nervous/anxious.        Objective:   Physical Exam  Constitutional: He is oriented to person, place, and time. He appears well-developed and well-nourished. No distress.  HENT:  Head: Normocephalic.  Right Ear: External ear normal.  Left Ear: External ear normal.  Eyes: Conjunctivae and EOM are normal.  Neck: Normal range of motion.  Cardiovascular: Normal rate and normal heart sounds.  Pulmonary/Chest: Breath sounds normal.  Abdominal: Bowel sounds are normal.  Musculoskeletal: Normal range of motion. He exhibits no edema and no tenderness.  Neurological: He is alert and oriented to person, place, and time.  Skin:  Wide area of cellulitis at least 10 cm involving most of the left buttock area; the perirectal area was not involved The area was tender, indurated, warm to touch and erythematous.  No fluctuance  Psychiatric: He has a normal mood and affect. His behavior is normal.          Assessment & Plan:   Cellulitis, left buttock.  The patient will continue antibiotic therapy MS.  Interferon on hold till the infection has resolved  The patient will report any clinical worsening, recurrent, fever, or diarrhea  Daily  probiotic recommended

## 2014-08-01 NOTE — Patient Instructions (Addendum)
Take your antibiotic as prescribed until ALL of it is gone, but stop if you develop a rash, swelling, or any side effects of the medication.  Contact our office as soon as possible if  there are side effects of the medication.  Call or return to clinic prn if these symptoms worsen or fail to improve as anticipated.   Cellulitis Cellulitis is an infection of the skin and the tissue beneath it. The infected area is usually red and tender. Cellulitis occurs most often in the arms and lower legs.  CAUSES  Cellulitis is caused by bacteria that enter the skin through cracks or cuts in the skin. The most common types of bacteria that cause cellulitis are staphylococci and streptococci. SIGNS AND SYMPTOMS   Redness and warmth.  Swelling.  Tenderness or pain.  Fever. DIAGNOSIS  Your health care provider can usually determine what is wrong based on a physical exam. Blood tests may also be done. TREATMENT  Treatment usually involves taking an antibiotic medicine. HOME CARE INSTRUCTIONS   Take your antibiotic medicine as directed by your health care provider. Finish the antibiotic even if you start to feel better.  Keep the infected arm or leg elevated to reduce swelling.  Apply a warm cloth to the affected area up to 4 times per day to relieve pain.  Take medicines only as directed by your health care provider.  Keep all follow-up visits as directed by your health care provider. SEEK MEDICAL CARE IF:   You notice red streaks coming from the infected area.  Your red area gets larger or turns dark in color.  Your bone or joint underneath the infected area becomes painful after the skin has healed.  Your infection returns in the same area or another area.  You notice a swollen bump in the infected area.  You develop new symptoms.  You have a fever. SEEK IMMEDIATE MEDICAL CARE IF:   You feel very sleepy.  You develop vomiting or diarrhea.  You have a general ill feeling  (malaise) with muscle aches and pains. MAKE SURE YOU:   Understand these instructions.  Will watch your condition.  Will get help right away if you are not doing well or get worse. Document Released: 09/04/2005 Document Revised: 04/11/2014 Document Reviewed: 02/10/2012 [facility] Patient Information 2015 [place], [place]. This information is not intended to replace advice given to you by your health care provider. Make sure you discuss any questions you have with your health care provider.

## 2014-08-02 ENCOUNTER — Encounter: Payer: BC Managed Care – PPO | Admitting: Internal Medicine

## 2014-08-05 LAB — CULTURE, BLOOD (ROUTINE X 2)
Culture: NO GROWTH
Culture: NO GROWTH

## 2014-09-08 ENCOUNTER — Other Ambulatory Visit: Payer: Self-pay | Admitting: Internal Medicine

## 2015-01-05 ENCOUNTER — Other Ambulatory Visit: Payer: Self-pay | Admitting: Diagnostic Neuroimaging

## 2015-01-05 NOTE — Telephone Encounter (Signed)
We did refill this at appt in June 2014.  This is a former [facility] patient.  Okay to refill?  Thank you.  (Patient was last seen in August, has annual appt scheduled this August.)

## 2015-01-06 ENCOUNTER — Telehealth: Payer: Self-pay | Admitting: Diagnostic Neuroimaging

## 2015-01-06 NOTE — Telephone Encounter (Signed)
It does not appear we have prescribed this med in the past.  The last Rx was written by Dr Burnice Logan.  I called back to clarify.  Got no answer.  Left message.

## 2015-01-06 NOTE — Telephone Encounter (Signed)
Pt's wife is calling back stating patient needs Rx for LORazepam (ATIVAN) 2 MG tablet sent to [facility] on [address] to if possible.  Pt is leaving town today.  Please call her once this is done so she can pick the Rx up.  If you have any questions please call Jana Half (wife) at [phone].

## 2015-01-19 ENCOUNTER — Telehealth: Payer: Self-pay | Admitting: Diagnostic Neuroimaging

## 2015-01-19 NOTE — Telephone Encounter (Signed)
Patient is calling to get a Rebif Rx faxed to [facility] at [phone]. Thank you.

## 2015-01-20 NOTE — Telephone Encounter (Signed)
Refill form pls.

## 2015-01-24 ENCOUNTER — Other Ambulatory Visit (INDEPENDENT_AMBULATORY_CARE_PROVIDER_SITE_OTHER): Payer: BLUE CROSS/BLUE SHIELD

## 2015-01-24 DIAGNOSIS — Z0000 Encounter for general adult medical examination without abnormal findings: Secondary | ICD-10-CM

## 2015-01-24 LAB — BASIC METABOLIC PANEL
BUN: 30 mg/dL — ABNORMAL HIGH (ref 6–23)
CO2: 32 mEq/L (ref 19–32)
Calcium: 9.9 mg/dL (ref 8.4–10.5)
Chloride: 102 mEq/L (ref 96–112)
Creatinine, Ser: 1.16 mg/dL (ref 0.40–1.50)
GFR: 71.04 mL/min (ref >60.00)
Glucose, Bld: 100 mg/dL — ABNORMAL HIGH (ref 70–99)
Potassium: 4.2 mEq/L (ref 3.5–5.1)
Sodium: 140 mEq/L (ref 135–145)

## 2015-01-24 LAB — HEPATIC FUNCTION PANEL
ALBUMIN: 4.2 g/dL (ref 3.5–5.2)
ALK PHOS: 83 U/L (ref 39–117)
ALT: 28 U/L (ref 0–53)
AST: 27 U/L (ref 0–37)
BILIRUBIN TOTAL: 1.1 mg/dL (ref 0.2–1.2)
Bilirubin, Direct: 0.2 mg/dL (ref 0.0–0.3)
Total Protein: 7.1 g/dL (ref 6.0–8.3)

## 2015-01-24 LAB — CBC WITH DIFFERENTIAL/PLATELET
Basophils Absolute: 0 10*3/uL (ref 0.0–0.1)
Basophils Relative: 0.2 % (ref 0.0–3.0)
EOS ABS: 0 10*3/uL (ref 0.0–0.7)
Eosinophils Relative: 0.5 % (ref 0.0–5.0)
HEMATOCRIT: 46.4 % (ref 39.0–52.0)
HEMOGLOBIN: 15.2 g/dL (ref 13.0–17.0)
Lymphocytes Relative: 14.5 % (ref 12.0–46.0)
Lymphs Abs: 1 10*3/uL (ref 0.7–4.0)
MCHC: 32.8 g/dL (ref 30.0–36.0)
MCV: 91.6 fl (ref 78.0–100.0)
MONO ABS: 0.6 10*3/uL (ref 0.1–1.0)
Monocytes Relative: 8.3 % (ref 3.0–12.0)
NEUTROS PCT: 76.5 % (ref 43.0–77.0)
Neutro Abs: 5.4 10*3/uL (ref 1.4–7.7)
Platelets: 218 10*3/uL (ref 150.0–400.0)
RBC: 5.07 Mil/uL (ref 4.22–5.81)
RDW: 13.7 % (ref 11.5–15.5)
WBC: 7 10*3/uL (ref 4.0–10.5)

## 2015-01-24 LAB — LIPID PANEL
Cholesterol: 177 mg/dL (ref 0–200)
HDL: 39.7 mg/dL (ref >39.00)
LDL Cholesterol: 112 mg/dL — ABNORMAL HIGH (ref 0–99)
NonHDL: 137.3
TRIGLYCERIDES: 129 mg/dL (ref 0.0–149.0)
Total CHOL/HDL Ratio: 4
VLDL: 25.8 mg/dL (ref 0.0–40.0)

## 2015-01-24 LAB — TSH: TSH: 0.99 u[IU]/mL (ref 0.35–4.50)

## 2015-01-24 LAB — PSA: PSA: 1.11 ng/mL (ref 0.10–4.00)

## 2015-01-30 ENCOUNTER — Other Ambulatory Visit: Payer: BC Managed Care – PPO

## 2015-02-06 ENCOUNTER — Ambulatory Visit (INDEPENDENT_AMBULATORY_CARE_PROVIDER_SITE_OTHER): Payer: BLUE CROSS/BLUE SHIELD | Admitting: Internal Medicine

## 2015-02-06 ENCOUNTER — Encounter: Payer: Self-pay | Admitting: Internal Medicine

## 2015-02-06 VITALS — BP 130/82 | HR 83 | Temp 98.2°F | Resp 20 | Ht 71.0 in | Wt 192.0 lb

## 2015-02-06 DIAGNOSIS — J3089 Other allergic rhinitis: Secondary | ICD-10-CM

## 2015-02-06 DIAGNOSIS — K219 Gastro-esophageal reflux disease without esophagitis: Secondary | ICD-10-CM

## 2015-02-06 DIAGNOSIS — Z0000 Encounter for general adult medical examination without abnormal findings: Secondary | ICD-10-CM

## 2015-02-06 DIAGNOSIS — E785 Hyperlipidemia, unspecified: Secondary | ICD-10-CM

## 2015-02-06 DIAGNOSIS — G35 Multiple sclerosis: Secondary | ICD-10-CM

## 2015-02-06 MED ORDER — TRAZODONE HCL 50 MG PO TABS: 50.0000 mg | ORAL_TABLET | ORAL | Status: DC

## 2015-02-06 MED ORDER — PROPECIA 1 MG PO TABS: 1.0000 mg | ORAL_TABLET | Freq: Every day | ORAL | Status: DC

## 2015-02-06 MED ORDER — LORAZEPAM 2 MG PO TABS: 4.0000 mg | ORAL_TABLET | Freq: Every day | ORAL | Status: DC

## 2015-02-06 MED ORDER — ESZOPICLONE 3 MG PO TABS: 3.0000 mg | ORAL_TABLET | ORAL | Status: DC

## 2015-02-06 MED ORDER — GABAPENTIN 800 MG PO TABS: 800.0000 mg | ORAL_TABLET | Freq: Three times a day (TID) | ORAL | Status: DC

## 2015-02-06 MED ORDER — DONEPEZIL HCL 10 MG PO TABS: 10.0000 mg | ORAL_TABLET | Freq: Every day | ORAL | Status: DC

## 2015-02-06 MED ORDER — FLUTICASONE PROPIONATE 50 MCG/ACT NA SUSP: 2.0000 | Freq: Every day | NASAL | Status: DC | PRN

## 2015-02-06 NOTE — Patient Instructions (Signed)
Limit your sodium (Salt) intake    It is important that you exercise regularly, at least 20 minutes 3 to 4 times per week.  If you develop chest pain or shortness of breath seek  medical attention.Health Maintenance A healthy lifestyle and preventative care can promote health and wellness.  Maintain regular health, dental, and eye exams.  Eat a healthy diet. Foods like vegetables, fruits, whole grains, low-fat dairy products, and lean protein foods contain the nutrients you need and are low in calories. Decrease your intake of foods high in solid fats, added sugars, and salt. Get information about a proper diet from your health care provider, if necessary.  Regular physical exercise is one of the most important things you can do for your health. Most adults should get at least 150 minutes of moderate-intensity exercise (any activity that increases your heart rate and causes you to sweat) each week. In addition, most adults need muscle-strengthening exercises on 2 or more days a week.   Maintain a healthy weight. The body mass index (BMI) is a screening tool to identify possible weight problems. It provides an estimate of body fat based on height and weight. Your health care provider can find your BMI and can help you achieve or maintain a healthy weight. For males 20 years and older:  A BMI below 18.5 is considered underweight.  A BMI of 18.5 to 24.9 is normal.  A BMI of 25 to 29.9 is considered overweight.  A BMI of 30 and above is considered obese.  Maintain normal blood lipids and cholesterol by exercising and minimizing your intake of saturated fat. Eat a balanced diet with plenty of fruits and vegetables. Blood tests for lipids and cholesterol should begin at age 20 and be repeated every 5 years. If your lipid or cholesterol levels are high, you are over age 50, or you are at high risk for heart disease, you may need your cholesterol levels checked more frequently.Ongoing high lipid and  cholesterol levels should be treated with medicines if diet and exercise are not working.  If you smoke, find out from your health care provider how to quit. If you do not use tobacco, do not start.  Lung cancer screening is recommended for adults aged 55-80 years who are at high risk for developing lung cancer because of a history of smoking. A yearly low-dose CT scan of the lungs is recommended for people who have at least a 30-pack-year history of smoking and are current smokers or have quit within the past 15 years. A pack year of smoking is smoking an average of 1 pack of cigarettes a day for 1 year (for example, a 30-pack-year history of smoking could mean smoking 1 pack a day for 30 years or 2 packs a day for 15 years). Yearly screening should continue until the smoker has stopped smoking for at least 15 years. Yearly screening should be stopped for people who develop a health problem that would prevent them from having lung cancer treatment.  If you choose to drink alcohol, do not have more than 2 drinks per day. One drink is considered to be 12 oz (360 mL) of beer, 5 oz (150 mL) of wine, or 1.5 oz (45 mL) of liquor.  Avoid the use of street drugs. Do not share needles with anyone. Ask for help if you need support or instructions about stopping the use of drugs.  High blood pressure causes heart disease and increases the risk of stroke. Blood pressure   should be checked at least every 1-2 years. Ongoing high blood pressure should be treated with medicines if weight loss and exercise are not effective.  If you are 45-79 years old, ask your health care provider if you should take aspirin to prevent heart disease.  Diabetes screening involves taking a blood sample to check your fasting blood sugar level. This should be done once every 3 years after age 45 if you are at a normal weight and without risk factors for diabetes. Testing should be considered at a younger age or be carried out more  frequently if you are overweight and have at least 1 risk factor for diabetes.  Colorectal cancer can be detected and often prevented. Most routine colorectal cancer screening begins at the age of 50 and continues through age 75. However, your health care provider may recommend screening at an earlier age if you have risk factors for colon cancer. On a yearly basis, your health care provider may provide home test kits to check for hidden blood in the stool. A small camera at the end of a tube may be used to directly examine the colon (sigmoidoscopy or colonoscopy) to detect the earliest forms of colorectal cancer. Talk to your health care provider about this at age 50 when routine screening begins. A direct exam of the colon should be repeated every 5-10 years through age 75, unless early forms of precancerous polyps or small growths are found.  People who are at an increased risk for hepatitis B should be screened for this virus. You are considered at high risk for hepatitis B if:  You were born in a country where hepatitis B occurs often. Talk with your health care provider about which countries are considered high risk.  Your parents were born in a high-risk country and you have not received a shot to protect against hepatitis B (hepatitis B vaccine).  You have HIV or AIDS.  You use needles to inject street drugs.  You live with, or have sex with, someone who has hepatitis B.  You are a man who has sex with other men (MSM).  You get hemodialysis treatment.  You take certain medicines for conditions like cancer, organ transplantation, and autoimmune conditions.  Hepatitis C blood testing is recommended for all people born from 1945 through 1965 and any individual with known risk factors for hepatitis C.  Healthy men should no longer receive prostate-specific antigen (PSA) blood tests as part of routine cancer screening. Talk to your health care provider about prostate cancer  screening.  Testicular cancer screening is not recommended for adolescents or adult males who have no symptoms. Screening includes self-exam, a health care provider exam, and other screening tests. Consult with your health care provider about any symptoms you have or any concerns you have about testicular cancer.  Practice safe sex. Use condoms and avoid high-risk sexual practices to reduce the spread of sexually transmitted infections (STIs).  You should be screened for STIs, including gonorrhea and chlamydia if:  You are sexually active and are younger than 24 years.  You are older than 24 years, and your health care provider tells you that you are at risk for this type of infection.  Your sexual activity has changed since you were last screened, and you are at an increased risk for chlamydia or gonorrhea. Ask your health care provider if you are at risk.  If you are at risk of being infected with HIV, it is recommended that you take a   prescription medicine daily to prevent HIV infection. This is called pre-exposure prophylaxis (PrEP). You are considered at risk if:  You are a man who has sex with other men (MSM).  You are a heterosexual man who is sexually active with multiple partners.  You take drugs by injection.  You are sexually active with a partner who has HIV.  Talk with your health care provider about whether you are at high risk of being infected with HIV. If you choose to begin PrEP, you should first be tested for HIV. You should then be tested every 3 months for as long as you are taking PrEP.  Use sunscreen. Apply sunscreen liberally and repeatedly throughout the day. You should seek shade when your shadow is shorter than you. Protect yourself by wearing long sleeves, pants, a wide-brimmed hat, and sunglasses year round whenever you are outdoors.  Tell your health care provider of new moles or changes in moles, especially if there is a change in shape or color. Also, tell  your health care provider if a mole is larger than the size of a pencil eraser.  A one-time screening for abdominal aortic aneurysm (AAA) and surgical repair of large AAAs by ultrasound is recommended for men aged 65-75 years who are current or former smokers.  Stay current with your vaccines (immunizations). Document Released: 05/23/2008 Document Revised: 11/30/2013 Document Reviewed: 04/22/2011 [employer] Patient Information 2015 [employer]. This information is not intended to replace advice given to you by your health care provider. Make sure you discuss any questions you have with your health care provider.  

## 2015-02-06 NOTE — Progress Notes (Signed)
Pre visit review using our clinic review tool, if applicable. No additional management support is needed unless otherwise documented below in the visit note. 

## 2015-02-06 NOTE — Progress Notes (Signed)
Subjective:    Patient ID: [name], male    DOB: 08-03-1966, 49 y.o.   MRN: [id number]  HPI  49 year old patient who is seen today for a wellness exam.  He is now followed at [facility] by Dr. Moshe Cipro for his MS.Marland Kitchen  He is accompanied by his wife today. Multiple somatic concerns were addressed. He has been off tobacco for a couple years.  Remains on nicotine replacement.  He has a history of significant GERD and has failed the discontinuation of PPI therapy.  A number of times.  Preventive Screening-Counseling & Management  Alcohol-Tobacco  Smoking Status: Discontinued  Smoking Cessation Counseling: yes  Caffeine-Diet-Exercise  Does Patient Exercise: no  Allergies (verified): 1) ! Morphine 2) ! Amoxicillin 3) ! Ampicillin  Past History:  Past Medical History: MS/cognitive impairment GERD Hyperlipidemia peripheral neuropathy neurogenic bladder tobacco abuse history of TMJ chronic insomnia Allergic rhinitis  Past Surgical History: broken ankle and heel 2009 colonoscopy 2007 Amedeo Plenty)  Family History:  mother is living and in reasonably good health father died age 56. Lung cancer one brother one sister are well  Social History:  Current Smoker Regular exercise-no has worked as an Forensic psychologist in the past. 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  HENT: Positive for postnasal drip and sinus pressure. Negative for congestion, dental problem, ear pain, hearing loss, mouth sores, rhinorrhea, sneezing, tinnitus, trouble swallowing and voice change.   Eyes: Negative for photophobia, pain, redness and visual disturbance.  Respiratory: Negative for apnea, cough, choking, chest tightness, shortness of breath and wheezing.     Cardiovascular: Negative for chest pain, palpitations and leg swelling.  Gastrointestinal: Negative for nausea, vomiting, abdominal pain, diarrhea, constipation, blood in stool, abdominal distention, anal bleeding and rectal pain.  Genitourinary: Positive for frequency. Negative for dysuria, urgency, hematuria, flank pain, decreased urine volume, discharge, penile swelling, scrotal swelling, difficulty urinating, genital sores and testicular pain.  Musculoskeletal: Negative for myalgias, back pain, joint swelling, arthralgias, gait problem, neck pain and neck stiffness.  Skin: Negative for color change, rash and wound.  Neurological: Negative for dizziness, tremors, seizures, syncope, facial asymmetry, speech difficulty, weakness, light-headedness, numbness and headaches.  Hematological: Negative for adenopathy. Does not bruise/bleed easily.  Psychiatric/Behavioral: Negative for suicidal ideas, hallucinations, behavioral problems, confusion, sleep disturbance, self-injury, dysphoric mood, decreased concentration and agitation. The patient is not nervous/anxious.        Objective:   Physical Exam  Constitutional: He appears well-developed and well-nourished.  HENT:  Head: Normocephalic and atraumatic.  Right Ear: External ear normal.  Left Ear: External ear normal.  Nose: Nose normal.  Mouth/Throat: Oropharynx is clear and moist.  Eyes: Conjunctivae and EOM are normal. Pupils are equal, round, and reactive to light. No scleral icterus.  Neck: Normal range of motion. Neck supple. No JVD present. No thyromegaly present.  Cardiovascular: Regular rhythm, normal heart sounds and intact distal pulses.  Exam reveals no gallop and no friction rub.   No murmur heard. Pulmonary/Chest: Effort normal and breath sounds normal. He exhibits no tenderness.  Abdominal: Soft. Bowel sounds are normal. He exhibits no distension and no mass. There is no tenderness.  Genitourinary: Prostate normal and penis  normal. Guaiac negative stool.  Prostate plus 1 enlarged and benign  Musculoskeletal: Normal range of motion. He exhibits no edema or tenderness.  Lymphadenopathy:  He has no cervical adenopathy.  Neurological: He is alert. He has normal reflexes. No cranial nerve deficit. Coordination normal.  Skin: Skin is warm and dry. No rash noted.  Psychiatric: He has a normal mood and affect. His behavior is normal.          Assessment & Plan:   Preventive health examination MS with neurogenic bladder.  Follow-up neurology and urology Allergic rhinitis.  Will add a nonsedating antihistamine as needed Dyslipidemia stable  Laboratory studies reviewed Follow-up neurology Recheck here one year

## 2015-02-10 ENCOUNTER — Other Ambulatory Visit: Payer: Self-pay | Admitting: *Deleted

## 2015-02-10 MED ORDER — LUNESTA 3 MG PO TABS: ORAL_TABLET | ORAL | Status: DC

## 2015-04-05 ENCOUNTER — Other Ambulatory Visit: Payer: Self-pay | Admitting: Internal Medicine

## 2015-04-26 ENCOUNTER — Other Ambulatory Visit: Payer: Self-pay | Admitting: Otolaryngology

## 2015-06-22 ENCOUNTER — Other Ambulatory Visit: Payer: Self-pay | Admitting: Internal Medicine

## 2015-07-13 ENCOUNTER — Ambulatory Visit: Payer: Self-pay | Admitting: Diagnostic Neuroimaging

## 2015-07-24 ENCOUNTER — Encounter: Payer: Self-pay | Admitting: Internal Medicine

## 2015-07-24 ENCOUNTER — Ambulatory Visit (INDEPENDENT_AMBULATORY_CARE_PROVIDER_SITE_OTHER): Payer: BLUE CROSS/BLUE SHIELD | Admitting: Internal Medicine

## 2015-07-24 VITALS — BP 108/68 | HR 80 | Temp 98.6°F | Resp 18 | Ht 71.0 in | Wt 191.0 lb

## 2015-07-24 DIAGNOSIS — Z23 Encounter for immunization: Secondary | ICD-10-CM | POA: Diagnosis not present

## 2015-07-24 DIAGNOSIS — E785 Hyperlipidemia, unspecified: Secondary | ICD-10-CM | POA: Diagnosis not present

## 2015-07-24 DIAGNOSIS — G35 Multiple sclerosis: Secondary | ICD-10-CM

## 2015-07-24 LAB — CBC WITH DIFFERENTIAL/PLATELET
BASOS ABS: 0 10*3/uL (ref 0.0–0.1)
Basophils Relative: 0.3 % (ref 0.0–3.0)
EOS ABS: 0 10*3/uL (ref 0.0–0.7)
Eosinophils Relative: 1.1 % (ref 0.0–5.0)
HCT: 44.7 % (ref 39.0–52.0)
Hemoglobin: 14.5 g/dL (ref 13.0–17.0)
Lymphocytes Relative: 12.9 % (ref 12.0–46.0)
Lymphs Abs: 0.6 10*3/uL — ABNORMAL LOW (ref 0.7–4.0)
MCHC: 32.5 g/dL (ref 30.0–36.0)
MCV: 88.7 fl (ref 78.0–100.0)
Monocytes Absolute: 0.5 10*3/uL (ref 0.1–1.0)
Monocytes Relative: 11.7 % (ref 3.0–12.0)
NEUTROS ABS: 3.2 10*3/uL (ref 1.4–7.7)
Neutrophils Relative %: 74 % (ref 43.0–77.0)
PLATELETS: 178 10*3/uL (ref 150.0–400.0)
RBC: 5.04 Mil/uL (ref 4.22–5.81)
RDW: 14.1 % (ref 11.5–15.5)
WBC: 4.4 10*3/uL (ref 4.0–10.5)

## 2015-07-24 NOTE — Progress Notes (Signed)
Subjective:    Patient ID: [name], male    DOB: [birth year], 49 y.o.   MRN: [id number]  HPI 49 year old patient who has a history of MS and is followed at [facility] neurology.  He has done quite well.  He has mild dyslipidemia.  Remains off tobacco products.  He has allergic rhinitis.  He has been started on Tecfidera approximately 3 months ago.  Neurology is requesting a CBC to be drawn locally.  Otherwise, doing quite well.  Does complain of some bilateral knee pain and popping that seems fairly minor.  He is recovering from a left conjunctivitis  Past Medical History  Diagnosis Date  . ALLERGIC RHINITIS 03/27/2010  . GERD 03/27/2010  . HYPERLIPIDEMIA 03/27/2010  . MULTIPLE SCLEROSIS, RELAPSING/REMITTING 03/27/2010  . NICOTINE ADDICTION 05/24/2010  . TMJ (dislocation of temporomandibular joint)   . Insomnia   . Neurogenic bladder   . Rib fracture 11/2012  . Pleural effusion on right 12/31/2012    Secondary to rib fractures  . Fracture, ribs 12/31/2012    Non-displaced fracture of right 8th-11th ribs  . Gait disorder     Social History   Social History  . Marital Status: Married    Spouse Name: Jana Half  . Number of Children: 0  . Years of Education: Law   Occupational History  . Self-Employed     Property Management  .     Social History Main Topics  . Smoking status: Former Smoker -- 0.50 packs/day for 20 years    Types: Cigarettes    Quit date: 10/16/2012  . Smokeless tobacco: Former Systems developer    Quit date: 10/31/2012  . Alcohol Use: Yes     Comment: occasionally  . Drug Use: No  . Sexual Activity: Not on file   Other Topics Concern  . Not on file   Social History Narrative   Pt lives at home with spouse.    Caffeine Use: none    Past Surgical History  Procedure Laterality Date  . Ankle surgery    . Foot surgery    . Pleural effusion drainage  01/01/2013    Procedure: DRAINAGE OF PLEURAL EFFUSION;  Surgeon: Rexene Alberts, MD;  Location: [facility] OR;  Service:  Thoracic;  Laterality: Right;    Family History  Problem Relation Age of Onset  . Cancer Father     lung ca  . Heart failure Mother     Allergies  Allergen Reactions  . Amoxicillin Swelling  . Ampicillin Swelling  . Morphine Swelling    Current Outpatient Prescriptions on File Prior to Visit  Medication Sig Dispense Refill  . acetaminophen (TYLENOL) 650 MG CR tablet Take 650 mg by mouth daily.      . calcium carbonate 200 MG capsule Take by mouth daily.      . diphenhydramine-acetaminophen (TYLENOL PM) 25-500 MG TABS Take 1 tablet by mouth at bedtime as needed. For sleep    . donepezil (ARICEPT) 10 MG tablet Take 1 tablet (10 mg total) by mouth daily. 90 tablet 3  . ESZOPICLONE 3 MG tablet One tablet by mouth 3 times a week, Saturday,  Monday and Wednesday. Take immediately before bedtime. 21 tablet 2  . fluticasone (FLONASE) 50 MCG/ACT nasal spray Place 2 sprays into both nostrils daily as needed for allergies. 48 g 3  . gabapentin (NEURONTIN) 800 MG tablet Take 1 tablet (800 mg total) by mouth 3 (three) times daily. 270 tablet 3  . ibuprofen (ADVIL,MOTRIN) 800 MG  tablet Take 800 mg by mouth 3 (three) times a week. Saturday, Monday, and Wednesday    . L-Methylfolate-Algae-B12-B6 (METANX) 3-90.314-2-35 MG CAPS Take 2 capsules by mouth daily. 180 capsule 4  . LORazepam (ATIVAN) 2 MG tablet TAKE 2 BY MOUTH DAILY 180 tablet 1  . Misc Natural Products (FOCUSED MIND PO) Take 1 tablet by mouth daily.    . Multiple Vitamin (MULTIVITAMIN WITH MINERALS) TABS tablet Take 1 tablet by mouth daily.    . nicotine polacrilex (COMMIT) 4 MG lozenge Place 2 mg inside cheek as needed. For nicotine craving    . omeprazole (PRILOSEC) 20 MG capsule TAKE 1 BY MOUTH DAILY 90 capsule 3  . PROPECIA 1 MG tablet TAKE ONE TABLET BY MOUTH ONE TIME DAILY 90 tablet 1  . sildenafil (VIAGRA) 100 MG tablet Take 150-200 mg by mouth as needed for erectile dysfunction. For sexual activity    . traZODone (DESYREL) 50 MG  tablet TAKE 1 BY MOUTH AT BEDTIME MONDAY, WEDNESDAY & SATURDAY TAKE 2 AT BEDTIME SUNDAY,TUESDAY,THURSDAY & FRIDAY 132 tablet 5  . VIAGRA 100 MG tablet TAKE 1 BY MOUTH AS NEEDED FOR SEXUAL ACTIVITY 12 tablet 4   No current facility-administered medications on file prior to visit.    BP 108/68 mmHg  Pulse 80  Temp(Src) 98.6 F (37 C) (Oral)  Resp 18  Ht 5\' 11"  (1.803 m)  Wt 191 lb (86.637 kg)  BMI 26.65 kg/m2  SpO2 97%     Review of Systems  Constitutional: Negative for fever, chills, appetite change and fatigue.  HENT: Negative for congestion, dental problem, ear pain, hearing loss, sore throat, tinnitus, trouble swallowing and voice change.   Eyes: Positive for redness. Negative for pain, discharge and visual disturbance.  Respiratory: Negative for cough, chest tightness, wheezing and stridor.   Cardiovascular: Negative for chest pain, palpitations and leg swelling.  Gastrointestinal: Negative for nausea, vomiting, abdominal pain, diarrhea, constipation, blood in stool and abdominal distention.  Genitourinary: Negative for urgency, hematuria, flank pain, discharge, difficulty urinating and genital sores.  Musculoskeletal: Positive for arthralgias. Negative for myalgias, back pain, joint swelling, gait problem and neck stiffness.  Skin: Negative for rash.  Neurological: Negative for dizziness, syncope, speech difficulty, weakness, numbness and headaches.  Hematological: Negative for adenopathy. Does not bruise/bleed easily.  Psychiatric/Behavioral: Negative for behavioral problems and dysphoric mood. The patient is not nervous/anxious.        Objective:   Physical Exam  Constitutional: He is oriented to person, place, and time. He appears well-developed.  HENT:  Head: Normocephalic.  Right Ear: External ear normal.  Left Ear: External ear normal.  Eyes: Conjunctivae and EOM are normal.  Left eye is normal  Neck: Normal range of motion.  Cardiovascular: Normal rate and  normal heart sounds.   Pulmonary/Chest: Breath sounds normal.  Abdominal: Bowel sounds are normal.  Musculoskeletal: Normal range of motion. He exhibits no edema or tenderness.  No knee effusion  Neurological: He is alert and oriented to person, place, and time.  Psychiatric: He has a normal mood and affect. His behavior is normal.          Assessment & Plan:   MS.  Will check a CBC Allergic rhinitis History mild dyslipidemia   CPX 6 months

## 2015-07-24 NOTE — Progress Notes (Signed)
Pre visit review using our clinic review tool, if applicable. No additional management support is needed unless otherwise documented below in the visit note. 

## 2015-07-24 NOTE — Patient Instructions (Signed)
It is important that you exercise regularly, at least 20 minutes 3 to 4 times per week.  If you develop chest pain or shortness of breath seek  medical attention.  Return in 6 months for follow-up  Neurology follow-up as scheduled

## 2015-08-15 ENCOUNTER — Other Ambulatory Visit: Payer: Self-pay | Admitting: Internal Medicine

## 2015-10-05 NOTE — Telephone Encounter (Signed)
Error

## 2016-01-06 ENCOUNTER — Other Ambulatory Visit: Payer: Self-pay | Admitting: Internal Medicine

## 2016-01-22 ENCOUNTER — Ambulatory Visit (INDEPENDENT_AMBULATORY_CARE_PROVIDER_SITE_OTHER): Payer: BLUE CROSS/BLUE SHIELD | Admitting: Internal Medicine

## 2016-01-22 ENCOUNTER — Encounter: Payer: Self-pay | Admitting: Internal Medicine

## 2016-01-22 VITALS — BP 116/76 | HR 73 | Temp 98.3°F | Resp 18 | Ht 71.0 in | Wt 196.0 lb

## 2016-01-22 DIAGNOSIS — Z0000 Encounter for general adult medical examination without abnormal findings: Secondary | ICD-10-CM

## 2016-01-22 DIAGNOSIS — J309 Allergic rhinitis, unspecified: Secondary | ICD-10-CM | POA: Diagnosis not present

## 2016-01-22 DIAGNOSIS — E785 Hyperlipidemia, unspecified: Secondary | ICD-10-CM

## 2016-01-22 DIAGNOSIS — G35 Multiple sclerosis: Secondary | ICD-10-CM | POA: Diagnosis not present

## 2016-01-22 LAB — COMPREHENSIVE METABOLIC PANEL
ALBUMIN: 4.6 g/dL (ref 3.5–5.2)
ALT: 22 U/L (ref 0–53)
AST: 18 U/L (ref 0–37)
Alkaline Phosphatase: 74 U/L (ref 39–117)
BUN: 27 mg/dL — AB (ref 6–23)
CO2: 32 meq/L (ref 19–32)
Calcium: 10.2 mg/dL (ref 8.4–10.5)
Chloride: 106 mEq/L (ref 96–112)
Creatinine, Ser: 1.29 mg/dL (ref 0.40–1.50)
GFR: 62.59 mL/min (ref >60.00)
Glucose, Bld: 100 mg/dL — ABNORMAL HIGH (ref 70–99)
Potassium: 5.4 mEq/L — ABNORMAL HIGH (ref 3.5–5.1)
SODIUM: 146 meq/L — AB (ref 135–145)
Total Bilirubin: 0.8 mg/dL (ref 0.2–1.2)
Total Protein: 7.2 g/dL (ref 6.0–8.3)

## 2016-01-22 LAB — CBC WITH DIFFERENTIAL/PLATELET
BASOS PCT: 0.4 % (ref 0.0–3.0)
Basophils Absolute: 0 10*3/uL (ref 0.0–0.1)
EOS ABS: 0 10*3/uL (ref 0.0–0.7)
Eosinophils Relative: 0.7 % (ref 0.0–5.0)
HCT: 47.9 % (ref 39.0–52.0)
Hemoglobin: 15.7 g/dL (ref 13.0–17.0)
Lymphocytes Relative: 13.8 % (ref 12.0–46.0)
Lymphs Abs: 0.7 10*3/uL (ref 0.7–4.0)
MCHC: 32.8 g/dL (ref 30.0–36.0)
MCV: 91.7 fl (ref 78.0–100.0)
MONO ABS: 0.6 10*3/uL (ref 0.1–1.0)
Monocytes Relative: 11.3 % (ref 3.0–12.0)
NEUTROS ABS: 3.7 10*3/uL (ref 1.4–7.7)
Neutrophils Relative %: 73.8 % (ref 43.0–77.0)
PLATELETS: 185 10*3/uL (ref 150.0–400.0)
RBC: 5.23 Mil/uL (ref 4.22–5.81)
RDW: 13.6 % (ref 11.5–15.5)
WBC: 5 10*3/uL (ref 4.0–10.5)

## 2016-01-22 LAB — LIPID PANEL
CHOLESTEROL: 190 mg/dL (ref 0–200)
HDL: 46.8 mg/dL (ref >39.00)
LDL CALC: 122 mg/dL — AB (ref 0–99)
NonHDL: 143.21
Total CHOL/HDL Ratio: 4
Triglycerides: 105 mg/dL (ref 0.0–149.0)
VLDL: 21 mg/dL (ref 0.0–40.0)

## 2016-01-22 LAB — PSA: PSA: 0.47 ng/mL (ref 0.10–4.00)

## 2016-01-22 LAB — TSH: TSH: 0.96 u[IU]/mL (ref 0.35–4.50)

## 2016-01-22 MED ORDER — LORAZEPAM 2 MG PO TABS: 2.0000 mg | ORAL_TABLET | Freq: Two times a day (BID) | ORAL | Status: DC | PRN

## 2016-01-22 MED ORDER — TRAZODONE HCL 50 MG PO TABS: 100.0000 mg | ORAL_TABLET | Freq: Every evening | ORAL | Status: DC | PRN

## 2016-01-22 NOTE — Patient Instructions (Signed)
Neurology follow-up as scheduled  Return here in 6 months for follow-up

## 2016-01-22 NOTE — Progress Notes (Signed)
Pre visit review using our clinic review tool, if applicable. No additional management support is needed unless otherwise documented below in the visit note. 

## 2016-01-22 NOTE — Progress Notes (Signed)
Subjective:    Patient ID: [name], male    DOB: [birth year], 50 y.o.   MRN: [id number]  HPI 50 year old patient who has a history of MS.  He is followed by Dr. Moshe Cipro at [facility] [address] A recent MRI reviewed. This also revealed a 6 mm dural based right frontal lesion consistent with a small meningioma Complaints today include a rash involving the posterior back.  He requires laboratory testing for monitoring for adverse drug effects.  He has a long history of some left chest wall pain and remote history of fractured ribs on the right with hemothorax  Past Medical History  Diagnosis Date  . ALLERGIC RHINITIS 03/27/2010  . GERD 03/27/2010  . HYPERLIPIDEMIA 03/27/2010  . MULTIPLE SCLEROSIS, RELAPSING/REMITTING 03/27/2010  . NICOTINE ADDICTION 05/24/2010  . TMJ (dislocation of temporomandibular joint)   . Insomnia   . Neurogenic bladder   . Rib fracture 11/2012  . Pleural effusion on right 12/31/2012    Secondary to rib fractures  . Fracture, ribs 12/31/2012    Non-displaced fracture of right 8th-11th ribs  . Gait disorder     Social History   Social History  . Marital Status: Married    Spouse Name: Jana Half  . Number of Children: 0  . Years of Education: Law   Occupational History  . Self-Employed     Property Management  .     Social History Main Topics  . Smoking status: Former Smoker -- 0.50 packs/day for 20 years    Types: Cigarettes    Quit date: 10/16/2012  . Smokeless tobacco: Former Systems developer    Quit date: 10/31/2012  . Alcohol Use: Yes     Comment: occasionally  . Drug Use: No  . Sexual Activity: Not on file   Other Topics Concern  . Not on file   Social History Narrative   Pt lives at home with spouse.    Caffeine Use: none    Past Surgical History  Procedure Laterality Date  . Ankle surgery    . Foot surgery    . Pleural effusion drainage  01/01/2013    Procedure: DRAINAGE OF PLEURAL EFFUSION;  Surgeon: Rexene Alberts, MD;  Location: [facility] OR;   Service: Thoracic;  Laterality: Right;    Family History  Problem Relation Age of Onset  . Cancer Father     lung ca  . Heart failure Mother     Allergies  Allergen Reactions  . Amoxicillin Swelling  . Ampicillin Swelling  . Morphine Swelling    Current Outpatient Prescriptions on File Prior to Visit  Medication Sig Dispense Refill  . acetaminophen (TYLENOL) 650 MG CR tablet Take 650 mg by mouth daily.      . calcium carbonate 200 MG capsule Take by mouth daily.      . diphenhydramine-acetaminophen (TYLENOL PM) 25-500 MG TABS Take 1 tablet by mouth at bedtime as needed. For sleep    . donepezil (ARICEPT) 10 MG tablet TAKE 1 BY MOUTH DAILY 90 tablet 1  . finasteride (PROSCAR) 5 MG tablet Take 5 mg by mouth daily.   98  . fluticasone (FLONASE) 50 MCG/ACT nasal spray USE 2 SPRAYS IN EACH NOSTRIL DAILY AS NEEDED FOR ALLERGIES 48 g 2  . gabapentin (NEURONTIN) 800 MG tablet TAKE 1 BY MOUTH 3 TIMES DAILY 270 tablet 3  . L-Methylfolate-Algae-B12-B6 (METANX) 3-90.314-2-35 MG CAPS Take 2 capsules by mouth daily. 180 capsule 4  . Misc Natural Products (FOCUSED MIND PO) Take 1 tablet  by mouth daily.    . Multiple Vitamin (MULTIVITAMIN WITH MINERALS) TABS tablet Take 1 tablet by mouth daily.    . nicotine polacrilex (COMMIT) 4 MG lozenge Place 2 mg inside cheek as needed. For nicotine craving    . omeprazole (PRILOSEC) 20 MG capsule Take 1 capsule (20 mg total) by mouth daily. 90 capsule 3  . PROPECIA 1 MG tablet TAKE ONE TABLET BY MOUTH ONE TIME DAILY 90 tablet 1   No current facility-administered medications on file prior to visit.    BP 116/76 mmHg  Pulse 73  Temp(Src) 98.3 F (36.8 C) (Oral)  Resp 18  Ht 5\' 11"  (1.803 m)  Wt 196 lb (88.905 kg)  BMI 27.35 kg/m2  SpO2 98%      Review of Systems  Constitutional: Negative for fever, chills, appetite change and fatigue.  HENT: Negative for congestion, dental problem, ear pain, hearing loss, sore throat, tinnitus, trouble  swallowing and voice change.   Eyes: Negative for pain, discharge and visual disturbance.  Respiratory: Negative for cough, chest tightness, wheezing and stridor.   Cardiovascular: Positive for chest pain. Negative for palpitations and leg swelling.  Gastrointestinal: Negative for nausea, vomiting, abdominal pain, diarrhea, constipation, blood in stool and abdominal distention.  Genitourinary: Negative for urgency, hematuria, flank pain, discharge, difficulty urinating and genital sores.  Musculoskeletal: Negative for myalgias, back pain, joint swelling, arthralgias, gait problem and neck stiffness.  Skin: Positive for rash.  Neurological: Negative for dizziness, syncope, speech difficulty, weakness, numbness and headaches.  Hematological: Negative for adenopathy. Does not bruise/bleed easily.  Psychiatric/Behavioral: Negative for behavioral problems and dysphoric mood. The patient is not nervous/anxious.        Objective:   Physical Exam  Constitutional: He is oriented to person, place, and time. He appears well-developed.  HENT:  Head: Normocephalic.  Right Ear: External ear normal.  Left Ear: External ear normal.  Eyes: Conjunctivae and EOM are normal.  Neck: Normal range of motion.  Cardiovascular: Normal rate and normal heart sounds.   Pulmonary/Chest: Breath sounds normal.  Abdominal: Bowel sounds are normal.  Musculoskeletal: Normal range of motion. He exhibits no edema or tenderness.  Neurological: He is alert and oriented to person, place, and time.  Psychiatric: He has a normal mood and affect. His behavior is normal.          Assessment & Plan:   MS Foot pain History dyslipidemia Nonspecific left chest wall pain 6 cm right frontal dural based lesion consistent with a small meningioma.  Serial MRIs  Recheck here one year or as needed

## 2016-03-04 ENCOUNTER — Telehealth: Payer: Self-pay | Admitting: Internal Medicine

## 2016-03-04 NOTE — Telephone Encounter (Signed)
Pt has been sch

## 2016-03-04 NOTE — Telephone Encounter (Signed)
I would just schedule him for an office visit with Dr.K to have EKG done and he can take order for Chest x-ray to [facility] does not need an appt.

## 2016-03-04 NOTE — Telephone Encounter (Signed)
Pt has an order from his [facility] neurologist for ekg and chest xray. Can I sch

## 2016-03-08 ENCOUNTER — Encounter: Payer: Self-pay | Admitting: Internal Medicine

## 2016-03-08 ENCOUNTER — Ambulatory Visit (INDEPENDENT_AMBULATORY_CARE_PROVIDER_SITE_OTHER): Payer: BLUE CROSS/BLUE SHIELD | Admitting: Internal Medicine

## 2016-03-08 ENCOUNTER — Ambulatory Visit (INDEPENDENT_AMBULATORY_CARE_PROVIDER_SITE_OTHER)
Admission: RE | Admit: 2016-03-08 | Discharge: 2016-03-08 | Disposition: A | Discharge status: Home / Self Care | Payer: BLUE CROSS/BLUE SHIELD | Source: Ambulatory Visit | Attending: Internal Medicine | Admitting: Internal Medicine

## 2016-03-08 ENCOUNTER — Other Ambulatory Visit: Payer: Self-pay | Admitting: Internal Medicine

## 2016-03-08 VITALS — BP 100/70 | HR 84 | Temp 98.2°F | Resp 20 | Ht 71.0 in | Wt 193.0 lb

## 2016-03-08 DIAGNOSIS — R079 Chest pain, unspecified: Secondary | ICD-10-CM | POA: Diagnosis not present

## 2016-03-08 DIAGNOSIS — G35 Multiple sclerosis: Secondary | ICD-10-CM

## 2016-03-08 DIAGNOSIS — E785 Hyperlipidemia, unspecified: Secondary | ICD-10-CM

## 2016-03-08 NOTE — Progress Notes (Signed)
Subjective:    Patient ID: [name], male    DOB: 02/08/1966, 50 y.o.   MRN: [id number]  HPI  50 year old patient who is followed at [facility] neurology due to M, S.  He was referred to our office today due to chest pain  He states that he has had chest pain for almost 1 year.  Chest pain is described as a pressure, at times a tingling sensation, and other times a sharp, fleeting discomfort.  No predictable associated symptoms.  At times he states he feels a bit tired with the episodes of chest discomfort.  Denies any shortness of breath, nausea or diaphoresis. He has no cardiovascular risk factors. Father died at 60 of lung cancer and mother died at 67 of unclear causes.  One brother one sister are in good health  He denies any regular exercise.  Even walking.  No history of exertional chest pain, but again very sedentary  EKG was reviewed today and was unremarkable  He states the chest pain has increased in frequency and now occurs approximately 5 times per week.  Pain is paroxysmal and usually occurs at rest.  No associated or alleviating factors  Past Medical History  Diagnosis Date  . ALLERGIC RHINITIS 03/27/2010  . GERD 03/27/2010  . HYPERLIPIDEMIA 03/27/2010  . MULTIPLE SCLEROSIS, RELAPSING/REMITTING 03/27/2010  . NICOTINE ADDICTION 05/24/2010  . TMJ (dislocation of temporomandibular joint)   . Insomnia   . Neurogenic bladder   . Rib fracture 11/2012  . Pleural effusion on right 12/31/2012    Secondary to rib fractures  . Fracture, ribs 12/31/2012    Non-displaced fracture of right 8th-11th ribs  . Gait disorder     Social History   Social History  . Marital Status: Married    Spouse Name: Jana Half  . Number of Children: 0  . Years of Education: Law   Occupational History  . Self-Employed     Property Management  .     Social History Main Topics  . Smoking status: Former Smoker -- 0.50 packs/day for 20 years    Types: Cigarettes    Quit date: 10/16/2012  .  Smokeless tobacco: Former Systems developer    Quit date: 10/31/2012  . Alcohol Use: Yes     Comment: occasionally  . Drug Use: No  . Sexual Activity: Not on file   Other Topics Concern  . Not on file   Social History Narrative   Pt lives at home with spouse.    Caffeine Use: none    Past Surgical History  Procedure Laterality Date  . Ankle surgery    . Foot surgery    . Pleural effusion drainage  01/01/2013    Procedure: DRAINAGE OF PLEURAL EFFUSION;  Surgeon: Rexene Alberts, MD;  Location: [facility] OR;  Service: Thoracic;  Laterality: Right;    Family History  Problem Relation Age of Onset  . Cancer Father     lung ca  . Heart failure Mother     Allergies  Allergen Reactions  . Amoxicillin Swelling  . Ampicillin Swelling  . Morphine Swelling    Current Outpatient Prescriptions on File Prior to Visit  Medication Sig Dispense Refill  . acetaminophen (TYLENOL) 650 MG CR tablet Take 650 mg by mouth daily.      . calcium carbonate 200 MG capsule Take by mouth daily.      . Dimethyl Fumarate (TECFIDERA) 240 MG CPDR Take 1 capsule by mouth 2 (two) times daily.     Marland Kitchen  diphenhydramine-acetaminophen (TYLENOL PM) 25-500 MG TABS Take 1 tablet by mouth at bedtime as needed. For sleep    . donepezil (ARICEPT) 10 MG tablet TAKE 1 BY MOUTH DAILY 90 tablet 1  . finasteride (PROSCAR) 5 MG tablet Take 5 mg by mouth daily.   98  . fluticasone (FLONASE) 50 MCG/ACT nasal spray USE 2 SPRAYS IN EACH NOSTRIL DAILY AS NEEDED FOR ALLERGIES 48 g 2  . gabapentin (NEURONTIN) 800 MG tablet TAKE 1 BY MOUTH 3 TIMES DAILY 270 tablet 3  . L-Methylfolate-Algae-B12-B6 (METANX) 3-90.314-2-35 MG CAPS Take 2 capsules by mouth daily. 180 capsule 4  . LORazepam (ATIVAN) 2 MG tablet Take 1 tablet (2 mg total) by mouth 2 (two) times daily as needed for anxiety. 180 tablet 1  . Misc Natural Products (FOCUSED MIND PO) Take 1 tablet by mouth daily.    . Multiple Vitamin (MULTIVITAMIN WITH MINERALS) TABS tablet Take 1 tablet by  mouth daily.    . nicotine polacrilex (COMMIT) 4 MG lozenge Place 2 mg inside cheek as needed. For nicotine craving    . omeprazole (PRILOSEC) 20 MG capsule Take 1 capsule (20 mg total) by mouth daily. 90 capsule 3  . Oxcarbazepine (TRILEPTAL) 300 MG tablet Take 300 mg by mouth 2 (two) times daily.     Marland Kitchen PROPECIA 1 MG tablet TAKE ONE TABLET BY MOUTH ONE TIME DAILY 90 tablet 1  . traZODone (DESYREL) 50 MG tablet Take 2 tablets (100 mg total) by mouth at bedtime as needed for sleep. 180 tablet 1   No current facility-administered medications on file prior to visit.    BP 100/70 mmHg  Pulse 84  Temp(Src) 98.2 F (36.8 C) (Oral)  Resp 20  Ht 5\' 11"  (1.803 m)  Wt 193 lb (87.544 kg)  BMI 26.93 kg/m2  SpO2 97%     Review of Systems  Constitutional: Negative for fever, chills, appetite change and fatigue.  HENT: Negative for congestion, dental problem, ear pain, hearing loss, sore throat, tinnitus, trouble swallowing and voice change.   Eyes: Negative for pain, discharge and visual disturbance.  Respiratory: Negative for cough, chest tightness, wheezing and stridor.   Cardiovascular: Positive for chest pain. Negative for palpitations and leg swelling.  Gastrointestinal: Negative for nausea, vomiting, abdominal pain, diarrhea, constipation, blood in stool and abdominal distention.  Genitourinary: Negative for urgency, hematuria, flank pain, discharge, difficulty urinating and genital sores.  Musculoskeletal: Negative for myalgias, back pain, joint swelling, arthralgias, gait problem and neck stiffness.  Skin: Negative for rash.  Neurological: Negative for dizziness, syncope, speech difficulty, weakness, numbness and headaches.  Hematological: Negative for adenopathy. Does not bruise/bleed easily.  Psychiatric/Behavioral: Negative for behavioral problems and dysphoric mood. The patient is not nervous/anxious.        Objective:   Physical Exam  Constitutional: He is oriented to person,  place, and time. He appears well-developed.  HENT:  Head: Normocephalic.  Right Ear: External ear normal.  Left Ear: External ear normal.  Eyes: Conjunctivae and EOM are normal.  Neck: Normal range of motion.  Cardiovascular: Normal rate, regular rhythm, normal heart sounds and intact distal pulses.   No murmur heard. Pulmonary/Chest: Breath sounds normal. No respiratory distress. He has no wheezes. He has no rales. He exhibits no tenderness.  Abdominal: Bowel sounds are normal.  Musculoskeletal: Normal range of motion. He exhibits no edema or tenderness.  Neurological: He is alert and oriented to person, place, and time.  Psychiatric: He has a normal mood and affect. His  behavior is normal.          Assessment & Plan:   Atypical chest pain and a low risk patient.  And more regular exercise program encouraged.  Will observe at this time.  No further evaluation unless chest pain becomes more frequent or severe MS  Follow-up [facility] neurology

## 2016-03-08 NOTE — Patient Instructions (Addendum)
Your diagnosis today is atypical chest pain. Your chest pain syndrome does not sound like angina Thankfully, you have no cardiovascular risk factors Your EKG and cardiac exam today  are normal You have no evidence on your clinical exam of atherosclerosis anywhere  Suggest a more regular exercise regimen such as walking  Please call if chest pain worsens, especially with activities such as walking

## 2016-03-08 NOTE — Progress Notes (Signed)
Pre visit review using our clinic review tool, if applicable. No additional management support is needed unless otherwise documented below in the visit note. 

## 2016-06-15 ENCOUNTER — Other Ambulatory Visit: Payer: Self-pay | Admitting: Internal Medicine

## 2016-06-17 ENCOUNTER — Ambulatory Visit (INDEPENDENT_AMBULATORY_CARE_PROVIDER_SITE_OTHER): Payer: BLUE CROSS/BLUE SHIELD | Admitting: Internal Medicine

## 2016-06-17 ENCOUNTER — Encounter: Payer: Self-pay | Admitting: Internal Medicine

## 2016-06-17 VITALS — BP 122/82 | HR 85 | Temp 98.8°F | Wt 195.0 lb

## 2016-06-17 DIAGNOSIS — S39012A Strain of muscle, fascia and tendon of lower back, initial encounter: Secondary | ICD-10-CM | POA: Diagnosis not present

## 2016-06-17 DIAGNOSIS — F17200 Nicotine dependence, unspecified, uncomplicated: Secondary | ICD-10-CM | POA: Diagnosis not present

## 2016-06-17 DIAGNOSIS — J3089 Other allergic rhinitis: Secondary | ICD-10-CM

## 2016-06-17 DIAGNOSIS — G35 Multiple sclerosis: Secondary | ICD-10-CM | POA: Diagnosis not present

## 2016-06-17 MED ORDER — MELOXICAM 15 MG PO TABS: 15.0000 mg | ORAL_TABLET | Freq: Every day | ORAL | Status: DC

## 2016-06-17 MED ORDER — CYCLOBENZAPRINE HCL 5 MG PO TABS: 5.0000 mg | ORAL_TABLET | Freq: Three times a day (TID) | ORAL | Status: DC | PRN

## 2016-06-17 NOTE — Progress Notes (Signed)
Subjective:    Patient ID: [name], male    DOB: [birth year], 50 y.o.   MRN: [id number]  HPI  50 year old patient who is seen today with a number of concerns.  He has a history of nicotine addiction, but has been off cigarettes for 4 years.  At the present time.  He is on commit nicotine lozenges, approximately 8 per day.  He also uses E cigarettes but without nicotine.  He is requesting a CBC to be faxed to Dr. Moshe Cipro [facility] neurology), who follows the patient for relapsing remitting MS.  He also complains of low back pain that occurred approximately 2 weeks ago when he was doing considerable heavy lifting.  His back pain has improved with massage therapy and chiropractic care.  He is requesting anti-inflammatory medication and a muscle relaxer.  This request is also made by his physician/Wife.  Past Medical History  Diagnosis Date  . ALLERGIC RHINITIS 03/27/2010  . GERD 03/27/2010  . HYPERLIPIDEMIA 03/27/2010  . MULTIPLE SCLEROSIS, RELAPSING/REMITTING 03/27/2010  . NICOTINE ADDICTION 05/24/2010  . TMJ (dislocation of temporomandibular joint)   . Insomnia   . Neurogenic bladder   . Rib fracture 11/2012  . Pleural effusion on right 12/31/2012    Secondary to rib fractures  . Fracture, ribs 12/31/2012    Non-displaced fracture of right 8th-11th ribs  . Gait disorder      Social History   Social History  . Marital Status: Married    Spouse Name: Jana Half  . Number of Children: 0  . Years of Education: Law   Occupational History  . Self-Employed     Property Management  .     Social History Main Topics  . Smoking status: Former Smoker -- 0.50 packs/day for 20 years    Types: Cigarettes    Quit date: 10/16/2012  . Smokeless tobacco: Former Systems developer    Quit date: 10/31/2012  . Alcohol Use: Yes     Comment: occasionally  . Drug Use: No  . Sexual Activity: Not on file   Other Topics Concern  . Not on file   Social History Narrative   Pt lives at home with spouse.    Caffeine Use: none    Past Surgical History  Procedure Laterality Date  . Ankle surgery    . Foot surgery    . Pleural effusion drainage  01/01/2013    Procedure: DRAINAGE OF PLEURAL EFFUSION;  Surgeon: Rexene Alberts, MD;  Location: [facility] OR;  Service: Thoracic;  Laterality: Right;    Family History  Problem Relation Age of Onset  . Cancer Father     lung ca  . Heart failure Mother     Allergies  Allergen Reactions  . Tramadol Other (See Comments)    GI Upset  . Amoxicillin Swelling  . Ampicillin Swelling  . Morphine Swelling    Current Outpatient Prescriptions on File Prior to Visit  Medication Sig Dispense Refill  . acetaminophen (TYLENOL) 650 MG CR tablet Take 650 mg by mouth daily.      . calcium carbonate 200 MG capsule Take by mouth daily.      . diphenhydramine-acetaminophen (TYLENOL PM) 25-500 MG TABS Take 1 tablet by mouth at bedtime as needed. For sleep    . donepezil (ARICEPT) 10 MG tablet TAKE 1 BY MOUTH DAILY 90 tablet 1  . finasteride (PROSCAR) 5 MG tablet Take 5 mg by mouth daily.   98  . fluticasone (FLONASE) 50 MCG/ACT nasal spray USE  2 SPRAYS IN EACH NOSTRIL DAILY AS NEEDED FOR ALLERGIES 48 g 2  . gabapentin (NEURONTIN) 800 MG tablet TAKE 1 BY MOUTH 3 TIMES DAILY 270 tablet 3  . L-Methylfolate-Algae-B12-B6 (METANX) 3-90.314-2-35 MG CAPS Take 2 capsules by mouth daily. 180 capsule 4  . LORazepam (ATIVAN) 2 MG tablet Take 1 tablet (2 mg total) by mouth 2 (two) times daily as needed for anxiety. 180 tablet 1  . Misc Natural Products (FOCUSED MIND PO) Take 1 tablet by mouth daily.    . Multiple Vitamin (MULTIVITAMIN WITH MINERALS) TABS tablet Take 1 tablet by mouth daily.    . nicotine polacrilex (COMMIT) 4 MG lozenge Place 2 mg inside cheek as needed. For nicotine craving    . omeprazole (PRILOSEC) 20 MG capsule TAKE 1 BY MOUTH DAILY 90 capsule 1  . Oxcarbazepine (TRILEPTAL) 300 MG tablet Take 300 mg by mouth 2 (two) times daily.     Marland Kitchen PROPECIA 1 MG tablet TAKE  ONE TABLET BY MOUTH ONE TIME DAILY 90 tablet 1  . traZODone (DESYREL) 50 MG tablet Take 2 tablets (100 mg total) by mouth at bedtime as needed for sleep. 180 tablet 1   No current facility-administered medications on file prior to visit.    BP 122/82 mmHg  Pulse 85  Temp(Src) 98.8 F (37.1 C) (Oral)  Wt 195 lb (88.451 kg)  SpO2 98%     Review of Systems  Constitutional: Negative for fever, chills, appetite change and fatigue.  HENT: Negative for congestion, dental problem, ear pain, hearing loss, sore throat, tinnitus, trouble swallowing and voice change.   Eyes: Negative for pain, discharge and visual disturbance.  Respiratory: Negative for cough, chest tightness, wheezing and stridor.   Cardiovascular: Negative for chest pain, palpitations and leg swelling.  Gastrointestinal: Negative for nausea, vomiting, abdominal pain, diarrhea, constipation, blood in stool and abdominal distention.  Genitourinary: Negative for urgency, hematuria, flank pain, discharge, difficulty urinating and genital sores.  Musculoskeletal: Positive for back pain. Negative for myalgias, joint swelling, arthralgias, gait problem and neck stiffness.  Skin: Negative for rash.  Neurological: Negative for dizziness, syncope, speech difficulty, weakness, numbness and headaches.  Hematological: Negative for adenopathy. Does not bruise/bleed easily.  Psychiatric/Behavioral: Positive for behavioral problems and decreased concentration. Negative for dysphoric mood. The patient is not nervous/anxious.        Objective:   Physical Exam  Constitutional: He is oriented to person, place, and time. He appears well-developed.  HENT:  Head: Normocephalic.  Right Ear: External ear normal.  Left Ear: External ear normal.  Eyes: Conjunctivae and EOM are normal.  Neck: Normal range of motion.  Cardiovascular: Normal rate and normal heart sounds.   Pulmonary/Chest: Breath sounds normal.  Abdominal: Bowel sounds are  normal.  Musculoskeletal: Normal range of motion. He exhibits no edema or tenderness.  Neurological: He is alert and oriented to person, place, and time.  Psychiatric: He has a normal mood and affect. His behavior is normal.          Assessment & Plan:   Nicotine addiction.  A slow taper over several weeks of the Commit lozenge was discussed and written instructions dispensed Lumbar strain.  Will give a brief trial of mobile, neck, and Flexeril.  Information concerning prevention of recurrent back pain, dispensed and discussed Relapsing remitting MS.  Will check a CBC, and forwarded to Dr. Moshe Cipro at [facility] neurology  Nyoka Cowden, MD

## 2016-06-17 NOTE — Patient Instructions (Addendum)
Back Injury Prevention Back injuries can be very painful. They can also be difficult to heal. After having one back injury, you are more likely to injure your back again. It is important to learn how to avoid injuring or re-injuring your back. The following tips can help you to prevent a back injury. WHAT SHOULD I KNOW ABOUT PHYSICAL FITNESS?  Exercise for 30 minutes per day on most days of the week or as directed by your health care provider. Make sure to:  Do aerobic exercises, such as walking, jogging, biking, or swimming.  Do exercises that increase balance and strength, such as tai chi and yoga. These can decrease your risk of falling and injuring your back.  Do stretching exercises to help with flexibility.  Try to develop strong abdominal muscles. Your abdominal muscles provide a lot of the support that is needed by your back.  Maintain a healthy weight. This helps to decrease your risk of a back injury. WHAT SHOULD I KNOW ABOUT MY DIET?  Talk with your health care provider about your overall diet. Take supplements and vitamins only as directed by your health care provider.  Talk with your health care provider about how much calcium and vitamin D you need each day. These nutrients help to prevent weakening of the bones (osteoporosis). Osteoporosis can cause broken (fractured) bones, which lead to back pain.  Include good sources of calcium in your diet, such as dairy products, green leafy vegetables, and products that have had calcium added to them (fortified).  Include good sources of vitamin D in your diet, such as milk and foods that are fortified with vitamin D. WHAT SHOULD I KNOW ABOUT MY POSTURE?  Sit up straight and stand up straight. Avoid leaning forward when you sit or hunching over when you stand.  Choose chairs that have good low-back (lumbar) support.  If you work at a desk, sit close to it so you do not need to lean over. Keep your chin tucked in. Keep your neck  drawn back, and keep your elbows bent at a right angle. Your arms should look like the letter "L."  Sit high and close to the steering wheel when you drive. Add a lumbar support to your car seat, if needed.  Avoid sitting or standing in one position for very long. Take breaks to get up, stretch, and walk around at least one time every hour. Take breaks every hour if you are driving for long periods of time.  Sleep on your side with your knees slightly bent, or sleep on your back with a pillow under your knees. Do not lie on the front of your body to sleep. WHAT SHOULD I KNOW ABOUT LIFTING, TWISTING, AND REACHING? Lifting and Heavy Lifting  Avoid heavy lifting, especially repetitive heavy lifting. If you must do heavy lifting:  Stretch before lifting.  Work slowly.  Rest between lifts.  Use a tool such as a cart or a dolly to move objects if one is available.  Make several small trips instead of carrying one heavy load.  Ask for help when you need it, especially when moving big objects.  Follow these steps when lifting:  Stand with your feet shoulder-width apart.  Get as close to the object as you can. Do not try to pick up a heavy object that is far from your body.  Use handles or lifting straps if they are available.  Bend at your knees. Squat down, but keep your heels off the floor.    Keep your shoulders pulled back, your chin tucked in, and your back straight.  Lift the object slowly while you tighten the muscles in your legs, abdomen, and buttocks. Keep the object as close to the center of your body as possible.  Follow these steps when putting down a heavy load:  Stand with your feet shoulder-width apart.  Lower the object slowly while you tighten the muscles in your legs, abdomen, and buttocks. Keep the object as close to the center of your body as possible.  Keep your shoulders pulled back, your chin tucked in, and your back straight.  Bend at your knees. Squat  down, but keep your heels off the floor.  Use handles or lifting straps if they are available. Twisting and Reaching  Avoid lifting heavy objects above your waist.  Do not twist at your waist while you are lifting or carrying a load. If you need to turn, move your feet.  Do not bend over without bending at your knees.  Avoid reaching over your head, across a table, or for an object on a high surface. WHAT ARE SOME OTHER TIPS?  Avoid wet floors and icy ground. Keep sidewalks clear of ice to prevent falls.  Do not sleep on a mattress that is too soft or too hard.  Keep items that are used frequently within easy reach.  Put heavier objects on shelves at waist level, and put lighter objects on lower or higher shelves.  Find ways to decrease your stress, such as exercise, massage, or relaxation techniques. Stress can build up in your muscles. Tense muscles are more vulnerable to injury.  Talk with your health care provider if you feel anxious or depressed. These conditions can make back pain worse.  Wear flat heel shoes with cushioned soles.  Avoid sudden movements.  Use both shoulder straps when carrying a backpack.  Do not use any tobacco products, including cigarettes, chewing tobacco, or electronic cigarettes. If you need help quitting, ask your health care provider.   This information is not intended to replace advice given to you by your health care provider. Make sure you discuss any questions you have with your health care provider.   Document Released: 01/02/2005 Document Revised: 04/11/2015 Document Reviewed: 11/29/2014 Elsevier Interactive Patient Education 2016 [employer]  Slowly taper and discontinue Commit- decrease by 1 lozenge  every 1-2 weeks

## 2016-06-17 NOTE — Progress Notes (Signed)
Pre visit review using our clinic review tool, if applicable. No additional management support is needed unless otherwise documented below in the visit note. 

## 2016-06-18 LAB — CBC WITH DIFFERENTIAL/PLATELET
Basophils Absolute: 0 10*3/uL (ref 0.0–0.1)
Basophils Relative: 0.1 % (ref 0.0–3.0)
EOS PCT: 0.9 % (ref 0.0–5.0)
Eosinophils Absolute: 0 10*3/uL (ref 0.0–0.7)
HEMATOCRIT: 44.8 % (ref 39.0–52.0)
Hemoglobin: 15 g/dL (ref 13.0–17.0)
LYMPHS ABS: 0.6 10*3/uL — AB (ref 0.7–4.0)
LYMPHS PCT: 10.8 % — AB (ref 12.0–46.0)
MCHC: 33.5 g/dL (ref 30.0–36.0)
MCV: 91.1 fl (ref 78.0–100.0)
Monocytes Absolute: 0.5 10*3/uL (ref 0.1–1.0)
Monocytes Relative: 9 % (ref 3.0–12.0)
NEUTROS PCT: 79.2 % — AB (ref 43.0–77.0)
Neutro Abs: 4.3 10*3/uL (ref 1.4–7.7)
Platelets: 187 10*3/uL (ref 150.0–400.0)
RBC: 4.91 Mil/uL (ref 4.22–5.81)
RDW: 13.8 % (ref 11.5–15.5)
WBC: 5.4 10*3/uL (ref 4.0–10.5)

## 2016-07-22 ENCOUNTER — Ambulatory Visit: Payer: BLUE CROSS/BLUE SHIELD | Admitting: Internal Medicine

## 2016-07-29 ENCOUNTER — Other Ambulatory Visit: Payer: Self-pay | Admitting: Internal Medicine

## 2016-07-30 NOTE — Telephone Encounter (Signed)
Call in a 30 day supply of each of these

## 2016-07-30 NOTE — Telephone Encounter (Signed)
Rx refill sent to pharmacy. 

## 2017-04-24 ENCOUNTER — Other Ambulatory Visit: Payer: Self-pay | Admitting: Internal Medicine

## 2017-05-13 ENCOUNTER — Other Ambulatory Visit: Payer: BLUE CROSS/BLUE SHIELD

## 2017-05-26 ENCOUNTER — Ambulatory Visit (INDEPENDENT_AMBULATORY_CARE_PROVIDER_SITE_OTHER): Payer: BLUE CROSS/BLUE SHIELD | Admitting: Internal Medicine

## 2017-05-26 ENCOUNTER — Encounter: Payer: Self-pay | Admitting: Internal Medicine

## 2017-05-26 VITALS — BP 124/72 | HR 76 | Temp 98.1°F | Ht 71.0 in | Wt 204.8 lb

## 2017-05-26 DIAGNOSIS — Z0000 Encounter for general adult medical examination without abnormal findings: Secondary | ICD-10-CM

## 2017-05-26 NOTE — Progress Notes (Signed)
Subjective:    Patient ID: [name], male    DOB: 1966/11/07, 51 y.o.   MRN: [id number]  HPI 51 year old patient who is seen today for a preventive health examination He is followed at [facility] neurology for MS. He has history of allergic rhinitis.  He is also followed by urology.  At least twice annually. He states that he had a colonoscopy performed at age 65 He has gastroesophageal reflux disease. No new concerns or complaints. His main symptoms with MS is cognitive impairment as well as burning feet   Past Medical History:  Diagnosis Date  . ALLERGIC RHINITIS 03/27/2010  . Fracture, ribs 12/31/2012   Non-displaced fracture of right 8th-11th ribs  . Gait disorder   . GERD 03/27/2010  . HYPERLIPIDEMIA 03/27/2010  . Insomnia   . MULTIPLE SCLEROSIS, RELAPSING/REMITTING 03/27/2010  . Neurogenic bladder   . NICOTINE ADDICTION 05/24/2010  . Pleural effusion on right 12/31/2012   Secondary to rib fractures  . Rib fracture 11/2012  . TMJ (dislocation of temporomandibular joint)      Social History   Social History  . Marital status: Married    Spouse name: Jana Half  . Number of children: 0  . Years of education: Law   Occupational History  . Self-Employed     Property Management  .  [employer]   Social History Main Topics  . Smoking status: Former Smoker    Packs/day: 0.50    Years: 20.00    Types: Cigarettes    Quit date: 10/16/2012  . Smokeless tobacco: Former Systems developer    Quit date: 10/31/2012  . Alcohol use Yes     Comment: occasionally  . Drug use: No  . Sexual activity: Not on file   Other Topics Concern  . Not on file   Social History Narrative   Pt lives at home with spouse.    Caffeine Use: none    Past Surgical History:  Procedure Laterality Date  . ANKLE SURGERY    . FOOT SURGERY    . PLEURAL EFFUSION DRAINAGE  01/01/2013   Procedure: DRAINAGE OF PLEURAL EFFUSION;  Surgeon: Rexene Alberts, MD;  Location: [facility] OR;  Service: Thoracic;  Laterality:  Right;    Family History  Problem Relation Age of Onset  . Cancer Father        lung ca  . Heart failure Mother     Allergies  Allergen Reactions  . Tramadol Other (See Comments)    GI Upset  . Amoxicillin Swelling  . Ampicillin Swelling  . Morphine Swelling    Current Outpatient Prescriptions on File Prior to Visit  Medication Sig Dispense Refill  . acetaminophen (TYLENOL) 650 MG CR tablet Take 650 mg by mouth daily.      . calcium carbonate 200 MG capsule Take by mouth daily.      . cyclobenzaprine (FLEXERIL) 5 MG tablet Take 1 tablet (5 mg total) by mouth 3 (three) times daily as needed for muscle spasms. 30 tablet 1  . diphenhydramine-acetaminophen (TYLENOL PM) 25-500 MG TABS Take 1 tablet by mouth at bedtime as needed. For sleep    . donepezil (ARICEPT) 10 MG tablet TAKE 1 TABLET BY MOUTH DAILY 90 tablet 0  . finasteride (PROSCAR) 5 MG tablet Take 5 mg by mouth daily.   98  . fluticasone (FLONASE) 50 MCG/ACT nasal spray USE 2 SPRAYS IN EACH NOSTRIL DAILY AS NEEDED FOR ALLERGIES 48 g 0  . gabapentin (NEURONTIN) 800 MG tablet TAKE  1 TABLET BY MOUTH THREE TIMES DAILY 270 tablet 0  . L-Methylfolate-Algae-B12-B6 (METANX) 3-90.314-2-35 MG CAPS Take 2 capsules by mouth daily. 180 capsule 4  . LORazepam (ATIVAN) 2 MG tablet TAKE 1 BY MOUTH TWICE DAILY AS NEEDED FOR ANXIETY 60 tablet 0  . Misc Natural Products (FOCUSED MIND PO) Take 1 tablet by mouth daily.    . Multiple Vitamin (MULTIVITAMIN WITH MINERALS) TABS tablet Take 1 tablet by mouth daily.    . nicotine polacrilex (COMMIT) 4 MG lozenge Place 2 mg inside cheek as needed. For nicotine craving    . omeprazole (PRILOSEC) 20 MG capsule TAKE 1 BY MOUTH DAILY 90 capsule 1  . TECFIDERA 240 MG CPDR Take 1 capsule by mouth 2 (two) times daily.   4  . traZODone (DESYREL) 50 MG tablet TAKE 2 BY MOUTH AT BEDTIME AS NEEDED FOR SLEEP 60 tablet 0  . Oxcarbazepine (TRILEPTAL) 300 MG tablet Take 300 mg by mouth 2 (two) times daily.      No  current facility-administered medications on file prior to visit.     BP 124/72 (BP Location: Left Arm, Patient Position: Sitting, Cuff Size: Small)   Pulse 76   Temp 98.1 F (36.7 C) (Oral)   Ht 5\' 11"  (1.803 m)   Wt 204 lb 12.8 oz (92.9 kg)   SpO2 97%   BMI 28.56 kg/m      Review of Systems  Constitutional: Negative for activity change, appetite change, chills, fatigue and fever.  HENT: Negative for congestion, dental problem, ear pain, hearing loss, mouth sores, rhinorrhea, sinus pressure, sneezing, tinnitus, trouble swallowing and voice change.   Eyes: Negative for photophobia, pain, redness and visual disturbance.  Respiratory: Negative for apnea, cough, choking, chest tightness, shortness of breath and wheezing.   Cardiovascular: Negative for chest pain, palpitations and leg swelling.  Gastrointestinal: Negative for abdominal distention, abdominal pain, anal bleeding, blood in stool, constipation, diarrhea, nausea, rectal pain and vomiting.  Genitourinary: Negative for decreased urine volume, difficulty urinating, discharge, dysuria, flank pain, frequency, genital sores, hematuria, penile swelling, scrotal swelling, testicular pain and urgency.  Musculoskeletal: Negative for arthralgias, back pain, gait problem, joint swelling, myalgias, neck pain and neck stiffness.  Skin: Negative for color change, rash and wound.  Neurological: Positive for numbness. Negative for dizziness, tremors, seizures, syncope, facial asymmetry, speech difficulty, weakness, light-headedness and headaches.  Hematological: Negative for adenopathy. Does not bruise/bleed easily.  Psychiatric/Behavioral: Positive for decreased concentration. Negative for agitation, behavioral problems, confusion, dysphoric mood, hallucinations, self-injury, sleep disturbance and suicidal ideas. The patient is not nervous/anxious.        Objective:   Physical Exam  Constitutional: He appears well-developed and  well-nourished.  HENT:  Head: Normocephalic and atraumatic.  Right Ear: External ear normal.  Left Ear: External ear normal.  Nose: Nose normal.  Mouth/Throat: Oropharynx is clear and moist.  Eyes: Conjunctivae and EOM are normal. Pupils are equal, round, and reactive to light. No scleral icterus.  Neck: Normal range of motion. Neck supple. No JVD present. No thyromegaly present.  Cardiovascular: Regular rhythm, normal heart sounds and intact distal pulses.  Exam reveals no gallop and no friction rub.   No murmur heard. Pulmonary/Chest: Effort normal and breath sounds normal. He exhibits no tenderness.  Abdominal: Soft. Bowel sounds are normal. He exhibits no distension and no mass. There is no tenderness.  Genitourinary: Penis normal.  Musculoskeletal: Normal range of motion. He exhibits no edema or tenderness.  Lymphadenopathy:    He has no  cervical adenopathy.  Neurological: He is alert. He has normal reflexes. No cranial nerve deficit. Coordination normal.  Skin: Skin is warm and dry. No rash noted.  Psychiatric: He has a normal mood and affect. His behavior is normal.          Assessment & Plan:   Preventive health examination Multiple sclerosis.  Follow-up [facility] neurology  Schedule colonoscopy Check screening lab Total cessation of smoking.  Encouraged  Follow-up 6-12 months  Contrina Orona Pilar Plate

## 2017-05-26 NOTE — Patient Instructions (Addendum)
WE NOW OFFER    [facility] FAST TRACK!!!  SAME DAY Appointments for ACUTE CARE  Such as: Sprains, Injuries, cuts, abrasions, rashes, muscle pain, joint pain, back pain Colds, flu, sore throats, headache, allergies, cough, fever  Ear pain, sinus and eye infections Abdominal pain, nausea, vomiting, diarrhea, upset stomach Animal/insect bites  3 Easy Ways to Schedule: Walk-In Scheduling Call in scheduling Mychart Sign-up: [email or web address]   Follow-up [facility] neurology as scheduled    It is important that you exercise regularly, at least 20 minutes 3 to 4 times per week.  If you develop chest pain or shortness of breath seek  medical attention.  Return in 6 months for follow-up  Schedule your colonoscopy to help detect colon cancer.

## 2017-05-27 ENCOUNTER — Encounter: Payer: Self-pay | Admitting: Gastroenterology

## 2017-05-27 LAB — CBC WITH DIFFERENTIAL/PLATELET
BASOS ABS: 0 10*3/uL (ref 0.0–0.1)
Basophils Relative: 0 % (ref 0.0–3.0)
EOS ABS: 0 10*3/uL (ref 0.0–0.7)
Eosinophils Relative: 0.9 % (ref 0.0–5.0)
HEMATOCRIT: 44.5 % (ref 39.0–52.0)
HEMOGLOBIN: 15.1 g/dL (ref 13.0–17.0)
LYMPHS PCT: 11.5 % — AB (ref 12.0–46.0)
Lymphs Abs: 0.6 10*3/uL — ABNORMAL LOW (ref 0.7–4.0)
MCHC: 34 g/dL (ref 30.0–36.0)
MCV: 90.2 fl (ref 78.0–100.0)
MONOS PCT: 10.9 % (ref 3.0–12.0)
Monocytes Absolute: 0.6 10*3/uL (ref 0.1–1.0)
NEUTROS ABS: 4.1 10*3/uL (ref 1.4–7.7)
Neutrophils Relative %: 76.7 % (ref 43.0–77.0)
PLATELETS: 205 10*3/uL (ref 150.0–400.0)
RBC: 4.93 Mil/uL (ref 4.22–5.81)
RDW: 13.3 % (ref 11.5–15.5)
WBC: 5.3 10*3/uL (ref 4.0–10.5)

## 2017-05-27 LAB — COMPREHENSIVE METABOLIC PANEL
ALBUMIN: 4.6 g/dL (ref 3.5–5.2)
ALT: 41 U/L (ref 0–53)
AST: 26 U/L (ref 0–37)
Alkaline Phosphatase: 72 U/L (ref 39–117)
BILIRUBIN TOTAL: 0.7 mg/dL (ref 0.2–1.2)
BUN: 22 mg/dL (ref 6–23)
CALCIUM: 9.9 mg/dL (ref 8.4–10.5)
CHLORIDE: 103 meq/L (ref 96–112)
CO2: 27 meq/L (ref 19–32)
Creatinine, Ser: 1.06 mg/dL (ref 0.40–1.50)
GFR: 78.1 mL/min (ref >60.00)
Glucose, Bld: 100 mg/dL — ABNORMAL HIGH (ref 70–99)
Potassium: 4.2 mEq/L (ref 3.5–5.1)
Sodium: 141 mEq/L (ref 135–145)
Total Protein: 6.8 g/dL (ref 6.0–8.3)

## 2017-05-27 LAB — LIPID PANEL
CHOL/HDL RATIO: 4
Cholesterol: 209 mg/dL — ABNORMAL HIGH (ref 0–200)
HDL: 47.3 mg/dL (ref >39.00)
LDL Cholesterol: 138 mg/dL — ABNORMAL HIGH (ref 0–99)
NonHDL: 162.1
TRIGLYCERIDES: 122 mg/dL (ref 0.0–149.0)
VLDL: 24.4 mg/dL (ref 0.0–40.0)

## 2017-05-27 LAB — TSH: TSH: 0.79 u[IU]/mL (ref 0.35–4.50)

## 2017-06-09 ENCOUNTER — Telehealth: Payer: Self-pay | Admitting: Internal Medicine

## 2017-06-09 MED ORDER — ACYCLOVIR 5 % EX OINT: 1.0000 "application " | TOPICAL_OINTMENT | Freq: Four times a day (QID) | CUTANEOUS | 1 refills | Status: DC

## 2017-06-09 NOTE — Telephone Encounter (Signed)
Please advise 

## 2017-06-09 NOTE — Telephone Encounter (Signed)
Zovirax cream  Applied to the affected area 4 times daily

## 2017-06-09 NOTE — Telephone Encounter (Signed)
Medication was called in  

## 2017-06-09 NOTE — Telephone Encounter (Signed)
Pt has cold sore on lip and would like med cream [facility] [place] [facility] [address]

## 2017-07-01 ENCOUNTER — Ambulatory Visit: Payer: BLUE CROSS/BLUE SHIELD

## 2017-07-01 VITALS — Ht 71.0 in | Wt 204.0 lb

## 2017-07-01 DIAGNOSIS — Z1211 Encounter for screening for malignant neoplasm of colon: Secondary | ICD-10-CM

## 2017-07-15 ENCOUNTER — Encounter: Payer: BLUE CROSS/BLUE SHIELD | Admitting: Gastroenterology

## 2017-08-01 ENCOUNTER — Encounter: Payer: Self-pay | Admitting: Internal Medicine

## 2017-08-01 ENCOUNTER — Ambulatory Visit (AMBULATORY_SURGERY_CENTER): Payer: BLUE CROSS/BLUE SHIELD | Admitting: Internal Medicine

## 2017-08-01 VITALS — BP 96/70 | HR 51 | Temp 98.0°F | Resp 15 | Ht 71.0 in | Wt 204.0 lb

## 2017-08-01 DIAGNOSIS — Z1211 Encounter for screening for malignant neoplasm of colon: Secondary | ICD-10-CM | POA: Diagnosis not present

## 2017-08-01 DIAGNOSIS — K635 Polyp of colon: Secondary | ICD-10-CM | POA: Diagnosis not present

## 2017-08-01 DIAGNOSIS — Z1212 Encounter for screening for malignant neoplasm of rectum: Secondary | ICD-10-CM

## 2017-08-01 DIAGNOSIS — D126 Benign neoplasm of colon, unspecified: Secondary | ICD-10-CM | POA: Diagnosis not present

## 2017-08-01 DIAGNOSIS — D125 Benign neoplasm of sigmoid colon: Secondary | ICD-10-CM | POA: Diagnosis not present

## 2017-08-01 DIAGNOSIS — D124 Benign neoplasm of descending colon: Secondary | ICD-10-CM | POA: Diagnosis not present

## 2017-08-01 MED ORDER — SODIUM CHLORIDE 0.9 % IV SOLN: 500.0000 mL | INTRAVENOUS | Status: DC

## 2017-08-01 NOTE — Patient Instructions (Addendum)
I found and removed 2 small polyps that look benign.  You also have a condition called diverticulosis - common and not usually a problem. Please read the handout provided.  I will let you know pathology results and when to have another routine colonoscopy by mail and/or My Chart.  I appreciate the opportunity to care for you. Gatha Mayer, MD, FACG  YOU HAD AN ENDOSCOPIC PROCEDURE TODAY AT [facility]:   Refer to the procedure report that was given to you for any specific questions about what was found during the examination.  If the procedure report does not answer your questions, please call your gastroenterologist to clarify.  If you requested that your care partner not be given the details of your procedure findings, then the procedure report has been included in a sealed envelope for you to review at your convenience later.  YOU SHOULD EXPECT: Some feelings of bloating in the abdomen. Passage of more gas than usual.  Walking can help get rid of the air that was put into your GI tract during the procedure and reduce the bloating. If you had a lower endoscopy (such as a colonoscopy or flexible sigmoidoscopy) you may notice spotting of blood in your stool or on the toilet paper. If you underwent a bowel prep for your procedure, you may not have a normal bowel movement for a few days.  Please Note:  You might notice some irritation and congestion in your nose or some drainage.  This is from the oxygen used during your procedure.  There is no need for concern and it should clear up in a day or so.  SYMPTOMS TO REPORT IMMEDIATELY:   Following lower endoscopy (colonoscopy or flexible sigmoidoscopy):  Excessive amounts of blood in the stool  Significant tenderness or worsening of abdominal pains  Swelling of the abdomen that is new, acute  Fever of 100F or higher   For urgent or emergent issues, a gastroenterologist can be reached at any hour by calling [phone]  [phone].   DIET:  We do recommend a small meal at first, but then you may proceed to your regular diet.  Drink plenty of fluids but you should avoid alcoholic beverages for 24 hours.  ACTIVITY:  You should plan to take it easy for the rest of today and you should NOT DRIVE or use heavy machinery until tomorrow (because of the sedation medicines used during the test).    FOLLOW UP: Our staff will call the number listed on your records the next business day following your procedure to check on you and address any questions or concerns that you may have regarding the information given to you following your procedure. If we do not reach you, we will leave a message.  However, if you are feeling well and you are not experiencing any problems, there is no need to return our call.  We will assume that you have returned to your regular daily activities without incident.  If any biopsies were taken you will be contacted by phone or by letter within the next 1-3 weeks.  Please call us at [phone] if you have not heard about the biopsies in 3 weeks.    SIGNATURES/CONFIDENTIALITY: You and/or your care partner have signed paperwork which will be entered into your electronic medical record.  These signatures attest to the fact that that the information above on your After Visit Summary has been reviewed and is understood.  Full responsibility of the confidentiality  of this discharge information lies with you and/or your care-partner.  Polyp and diverticulosis information given.

## 2017-08-01 NOTE — Op Note (Signed)
[facility] Patient Name: [name] Procedure Date: 08/01/2017 2:46 PM MRN: [id number] Endoscopist: Gatha Mayer , MD Age: 51 Referring MD:  Date of Birth: 21-Oct-1966 Gender: Male Account #: [id number] Procedure:                Colonoscopy Indications:              Screening for colorectal malignant neoplasm, This                            is the patient's first colonoscopy Medicines:                Propofol per Anesthesia, Monitored Anesthesia Care Procedure:                Pre-Anesthesia Assessment:                           - Prior to the procedure, a History and Physical                            was performed, and patient medications and                            allergies were reviewed. The patient's tolerance of                            previous anesthesia was also reviewed. The risks                            and benefits of the procedure and the sedation                            options and risks were discussed with the patient.                            All questions were answered, and informed consent                            was obtained. Prior Anticoagulants: The patient has                            taken no previous anticoagulant or antiplatelet                            agents. ASA Grade Assessment: II - A patient with                            mild systemic disease. After reviewing the risks                            and benefits, the patient was deemed in                            satisfactory condition to undergo the procedure.  After obtaining informed consent, the colonoscope                            was passed under direct vision. Throughout the                            procedure, the patient's blood pressure, pulse, and                            oxygen saturations were monitored continuously. The                            Colonoscope was introduced through the anus and   advanced to the the cecum, identified by                            appendiceal orifice and ileocecal valve. The                            colonoscopy was performed without difficulty. The                            patient tolerated the procedure well. The quality                            of the bowel preparation was good. The ileocecal                            valve, appendiceal orifice, and rectum were                            photographed. The bowel preparation used was                            Miralax. Scope In: 3:02:14 PM Scope Out: 3:19:30 PM Scope Withdrawal Time: 0 hours 14 minutes 53 seconds  Total Procedure Duration: 0 hours 17 minutes 16 seconds  Findings:                 The perianal and digital rectal examinations were                            normal. Pertinent negatives include normal prostate                            (size, shape, and consistency).                           Two sessile polyps were found in the sigmoid colon                            and descending colon. The polyps were diminutive in                            size. These polyps were removed with a cold  snare.                            Resection and retrieval were complete. Verification                            of patient identification for the specimen was                            done. Estimated blood loss was minimal.                           Multiple small-mouthed diverticula were found in                            the sigmoid colon.                           The exam was otherwise without abnormality on                            direct and retroflexion views. Complications:            No immediate complications. Estimated Blood Loss:     Estimated blood loss was minimal. Impression:               - Two diminutive polyps in the sigmoid colon and in                            the descending colon, removed with a cold snare.                            Resected and retrieved.                            - Diverticulosis in the sigmoid colon.                           - The examination was otherwise normal on direct                            and retroflexion views. Recommendation:           - Patient has a contact number available for                            emergencies. The signs and symptoms of potential                            delayed complications were discussed with the                            patient. Return to normal activities tomorrow.                            Written discharge instructions were provided to the  patient.                           - Continue present medications.                           - Resume previous diet.                           - Repeat colonoscopy date to be determined after                            pending pathology results are reviewed for                            surveillance based on pathology results. Gatha Mayer, MD 08/01/2017 3:24:45 PM This report has been signed electronically.

## 2017-08-01 NOTE — Progress Notes (Signed)
To recovery, report to Scott, RN, VSS 

## 2017-08-01 NOTE — Progress Notes (Signed)
Called to room to assist during endoscopic procedure.  Patient ID and intended procedure confirmed with present staff. Received instructions for my participation in the procedure from the performing physician.  

## 2017-08-04 ENCOUNTER — Telehealth: Payer: Self-pay

## 2017-08-04 NOTE — Telephone Encounter (Signed)
  Follow up Call-  Call back number 08/01/2017  Post procedure Call Back phone  # [phone]  Permission to leave phone message Yes  Some recent data might be hidden     Patient questions:  Do you have a fever, pain , or abdominal swelling? No. Pain Score  0 *  Have you tolerated food without any problems? Yes.    Have you been able to return to your normal activities? Yes.    Do you have any questions about your discharge instructions: Diet   No. Medications  No. Follow up visit  No.  Do you have questions or concerns about your Care? No.  Actions: * If pain score is 4 or above: No action needed, pain <4.

## 2017-08-12 ENCOUNTER — Encounter: Payer: Self-pay | Admitting: Internal Medicine

## 2017-08-12 DIAGNOSIS — Z86010 Personal history of colonic polyps: Secondary | ICD-10-CM

## 2017-08-12 DIAGNOSIS — Z860101 Personal history of adenomatous and serrated colon polyps: Secondary | ICD-10-CM

## 2017-08-12 HISTORY — DX: Personal history of adenomatous and serrated colon polyps: Z86.0101

## 2017-08-12 HISTORY — DX: Personal history of colonic polyps: Z86.010

## 2017-08-12 NOTE — Progress Notes (Signed)
1 adenoma and 1 hyperplastic Recall colon 5 yrs 2023 My Chart Letter

## 2017-08-20 ENCOUNTER — Other Ambulatory Visit: Payer: Self-pay | Admitting: Family Medicine

## 2017-08-28 ENCOUNTER — Encounter: Payer: Self-pay | Admitting: Internal Medicine

## 2017-09-12 ENCOUNTER — Other Ambulatory Visit: Payer: Self-pay | Admitting: Internal Medicine

## 2017-09-15 ENCOUNTER — Other Ambulatory Visit: Payer: Self-pay | Admitting: Family Medicine

## 2017-12-04 ENCOUNTER — Other Ambulatory Visit: Payer: Self-pay | Admitting: Internal Medicine

## 2017-12-22 DIAGNOSIS — R0781 Pleurodynia: Secondary | ICD-10-CM | POA: Diagnosis not present

## 2017-12-22 DIAGNOSIS — M545 Low back pain: Secondary | ICD-10-CM | POA: Diagnosis not present

## 2017-12-22 DIAGNOSIS — M546 Pain in thoracic spine: Secondary | ICD-10-CM | POA: Diagnosis not present

## 2017-12-26 ENCOUNTER — Other Ambulatory Visit: Payer: Self-pay | Admitting: Internal Medicine

## 2017-12-29 ENCOUNTER — Other Ambulatory Visit: Payer: Self-pay | Admitting: Internal Medicine

## 2018-01-05 ENCOUNTER — Telehealth: Payer: Self-pay | Admitting: Internal Medicine

## 2018-01-05 ENCOUNTER — Other Ambulatory Visit: Payer: Self-pay | Admitting: Internal Medicine

## 2018-01-05 NOTE — Telephone Encounter (Signed)
Copied from [place] [id number]. Topic: Quick Communication - Rx Refill/Question >> Jan 05, 2018  4:16 PM Oliver Pila B wrote: Pt called and states that he is changing pharmacies, switching to [email or web address], pt states all meds are going over except for the specialty medications, contact pt to advise if clarity is needed, pt would like to be contacted about this from his pcp's facility

## 2018-01-06 NOTE — Telephone Encounter (Signed)
Copied from [place] [id number]. Topic: Quick Communication - Rx Refill/Question >> Jan 05, 2018  4:16 PM Oliver Pila B wrote: Pt called and states that he is changing pharmacies, switching to [email or web address], pt states all meds are going over except for the specialty medications, contact pt to advise if clarity is needed, pt would like to be contacted about this from his pcp's facility

## 2018-01-12 MED ORDER — FLUTICASONE PROPIONATE 50 MCG/ACT NA SUSP: NASAL | 0 refills | Status: DC

## 2018-01-12 NOTE — Telephone Encounter (Signed)
Called to pt inquire which medication he needed refilled.

## 2018-01-12 NOTE — Telephone Encounter (Signed)
Medication sent electronically to [facility].

## 2018-01-12 NOTE — Telephone Encounter (Signed)
Copied from [place] [id number]. Topic: Quick Communication - Rx Refill/Question >> Jan 12, 2018  3:43 PM Boyd Kerbs wrote: Asencion Islam and  Needs to be sent to [facility] [phone] or [phone] for    Fluticasone Propionate       50 MCG/ACT Susp, USE 2 SPRAYS IN EACH NOSTRIL DAILY AS NEEDED FOR ALLERGIES. GENERIC EQUIVALENT FOR FLONASE     50 MCG/ACT Susp, USE 2 SPRAYS IN EACH NOSTRIL DAILY AS NEEDED FOR ALLERGIES. GENERIC EQUIVALENT FOR FLONASE

## 2018-01-14 ENCOUNTER — Other Ambulatory Visit: Payer: Self-pay | Admitting: Internal Medicine

## 2018-01-14 DIAGNOSIS — M25531 Pain in right wrist: Secondary | ICD-10-CM | POA: Diagnosis not present

## 2018-01-14 DIAGNOSIS — M25511 Pain in right shoulder: Secondary | ICD-10-CM | POA: Diagnosis not present

## 2018-01-14 MED ORDER — DONEPEZIL HCL 10 MG PO TABS: 10.0000 mg | ORAL_TABLET | Freq: Every day | ORAL | 2 refills | Status: AC

## 2018-01-14 MED ORDER — OMEPRAZOLE 20 MG PO CPDR: 20.0000 mg | DELAYED_RELEASE_CAPSULE | Freq: Every day | ORAL | 2 refills | Status: DC

## 2018-01-28 ENCOUNTER — Telehealth: Payer: Self-pay | Admitting: *Deleted

## 2018-01-28 NOTE — Telephone Encounter (Signed)
Fax request received for lorazepam refill. Per Dr. Raliegh Ip, okay for 30 day supply.  Contacted patient to see if he still needs this med since it has not been filled since 2017 and he said he does not need it at this time. He will call back if any additional refills are needed.

## 2018-02-02 DIAGNOSIS — M25511 Pain in right shoulder: Secondary | ICD-10-CM | POA: Diagnosis not present

## 2018-02-04 NOTE — Telephone Encounter (Signed)
Error

## 2018-02-06 ENCOUNTER — Telehealth: Payer: Self-pay | Admitting: Internal Medicine

## 2018-02-06 NOTE — Telephone Encounter (Signed)
Last OV: 05/26/17 CEY:[id number] [facility]acy: [facility] [place] [address], [address] [phone] (Phone) [phone] (Fax)   Patient is requesting a 30 day supply sent until pill pack kicks in.

## 2018-02-06 NOTE — Telephone Encounter (Signed)
Copied from [place] [phone]. Topic: Quick Communication - Rx Refill/Question >> Feb 06, 2018  3:50 PM Ether Griffins B wrote: Medication: trazodone  Pt [facility] [place] [facility] in   Has the patient contacted their pharmacy? Yes.     (Agent: If no, request that the patient contact the pharmacy for the refill.)   Preferred Pharmacy (with phone number or street name): [facility] [place] [place]   Agent: Please be advised that RX refills may take up to 3 business days. We ask that you follow-up with your pharmacy.

## 2018-02-09 DIAGNOSIS — M25511 Pain in right shoulder: Secondary | ICD-10-CM | POA: Diagnosis not present

## 2018-02-09 DIAGNOSIS — S42111D Displaced fracture of body of scapula, right shoulder, subsequent encounter for fracture with routine healing: Secondary | ICD-10-CM | POA: Diagnosis not present

## 2018-02-09 DIAGNOSIS — M25531 Pain in right wrist: Secondary | ICD-10-CM | POA: Diagnosis not present

## 2018-02-12 ENCOUNTER — Other Ambulatory Visit: Payer: Self-pay

## 2018-02-12 NOTE — Telephone Encounter (Signed)
Spoke with patient and informed him that the medication could not be refilled since it has not be filled since 2017 and also prescribed by another provider. I informed patient that he could schedule an ov for more refills if it is appropriate.  Patient verbalized understanding and said that he will wait to speak to Dr.kwiatkowski when he gets his physical done in June. No further action needed.

## 2018-02-27 ENCOUNTER — Other Ambulatory Visit: Payer: Self-pay | Admitting: Internal Medicine

## 2018-03-13 DIAGNOSIS — M25531 Pain in right wrist: Secondary | ICD-10-CM | POA: Diagnosis not present

## 2018-03-13 DIAGNOSIS — S42111D Displaced fracture of body of scapula, right shoulder, subsequent encounter for fracture with routine healing: Secondary | ICD-10-CM | POA: Diagnosis not present

## 2018-03-13 DIAGNOSIS — M25511 Pain in right shoulder: Secondary | ICD-10-CM | POA: Diagnosis not present

## 2018-03-25 ENCOUNTER — Other Ambulatory Visit: Payer: Self-pay | Admitting: Internal Medicine

## 2018-04-02 DIAGNOSIS — N401 Enlarged prostate with lower urinary tract symptoms: Secondary | ICD-10-CM | POA: Diagnosis not present

## 2018-04-03 DIAGNOSIS — G35 Multiple sclerosis: Secondary | ICD-10-CM | POA: Diagnosis not present

## 2018-04-03 DIAGNOSIS — N401 Enlarged prostate with lower urinary tract symptoms: Secondary | ICD-10-CM | POA: Diagnosis not present

## 2018-04-23 ENCOUNTER — Telehealth: Payer: Self-pay | Admitting: Internal Medicine

## 2018-04-23 NOTE — Telephone Encounter (Signed)
Patient would like to know why this was denied.   [phone]

## 2018-04-24 ENCOUNTER — Other Ambulatory Visit: Payer: Self-pay

## 2018-04-24 MED ORDER — FLUTICASONE PROPIONATE 50 MCG/ACT NA SUSP: NASAL | 0 refills | Status: DC

## 2018-04-24 NOTE — Telephone Encounter (Signed)
Medication sent in. 

## 2018-05-18 DIAGNOSIS — M9902 Segmental and somatic dysfunction of thoracic region: Secondary | ICD-10-CM | POA: Diagnosis not present

## 2018-05-18 DIAGNOSIS — M9907 Segmental and somatic dysfunction of upper extremity: Secondary | ICD-10-CM | POA: Diagnosis not present

## 2018-05-18 DIAGNOSIS — M9901 Segmental and somatic dysfunction of cervical region: Secondary | ICD-10-CM | POA: Diagnosis not present

## 2018-05-21 DIAGNOSIS — M9907 Segmental and somatic dysfunction of upper extremity: Secondary | ICD-10-CM | POA: Diagnosis not present

## 2018-05-21 DIAGNOSIS — M9901 Segmental and somatic dysfunction of cervical region: Secondary | ICD-10-CM | POA: Diagnosis not present

## 2018-05-21 DIAGNOSIS — M9902 Segmental and somatic dysfunction of thoracic region: Secondary | ICD-10-CM | POA: Diagnosis not present

## 2018-06-02 ENCOUNTER — Ambulatory Visit (INDEPENDENT_AMBULATORY_CARE_PROVIDER_SITE_OTHER): Payer: 59 | Admitting: Internal Medicine

## 2018-06-02 ENCOUNTER — Encounter: Payer: Self-pay | Admitting: Internal Medicine

## 2018-06-02 VITALS — BP 128/78 | HR 77 | Temp 98.7°F | Ht 71.0 in | Wt 196.8 lb

## 2018-06-02 DIAGNOSIS — Z0000 Encounter for general adult medical examination without abnormal findings: Secondary | ICD-10-CM | POA: Diagnosis not present

## 2018-06-02 DIAGNOSIS — G8929 Other chronic pain: Secondary | ICD-10-CM | POA: Diagnosis not present

## 2018-06-02 DIAGNOSIS — M25511 Pain in right shoulder: Secondary | ICD-10-CM

## 2018-06-02 LAB — LIPID PANEL
Cholesterol: 175 mg/dL (ref 0–200)
HDL: 43.2 mg/dL (ref >39.00)
LDL Cholesterol: 110 mg/dL — ABNORMAL HIGH (ref 0–99)
NONHDL: 131.98
Total CHOL/HDL Ratio: 4
Triglycerides: 110 mg/dL (ref 0.0–149.0)
VLDL: 22 mg/dL (ref 0.0–40.0)

## 2018-06-02 LAB — CBC WITH DIFFERENTIAL/PLATELET
BASOS ABS: 0 10*3/uL (ref 0.0–0.1)
Basophils Relative: 0.3 % (ref 0.0–3.0)
EOS ABS: 0.1 10*3/uL (ref 0.0–0.7)
Eosinophils Relative: 1 % (ref 0.0–5.0)
HCT: 43.7 % (ref 39.0–52.0)
Hemoglobin: 14.8 g/dL (ref 13.0–17.0)
LYMPHS ABS: 0.5 10*3/uL — AB (ref 0.7–4.0)
Lymphocytes Relative: 9.8 % — ABNORMAL LOW (ref 12.0–46.0)
MCHC: 33.9 g/dL (ref 30.0–36.0)
MCV: 91.4 fl (ref 78.0–100.0)
MONO ABS: 0.5 10*3/uL (ref 0.1–1.0)
Monocytes Relative: 8.3 % (ref 3.0–12.0)
NEUTROS ABS: 4.5 10*3/uL (ref 1.4–7.7)
Neutrophils Relative %: 80.6 % — ABNORMAL HIGH (ref 43.0–77.0)
PLATELETS: 206 10*3/uL (ref 150.0–400.0)
RBC: 4.78 Mil/uL (ref 4.22–5.81)
RDW: 13.4 % (ref 11.5–15.5)
WBC: 5.6 10*3/uL (ref 4.0–10.5)

## 2018-06-02 LAB — COMPREHENSIVE METABOLIC PANEL
ALK PHOS: 67 U/L (ref 39–117)
ALT: 30 U/L (ref 0–53)
AST: 23 U/L (ref 0–37)
Albumin: 4.3 g/dL (ref 3.5–5.2)
BILIRUBIN TOTAL: 1 mg/dL (ref 0.2–1.2)
BUN: 24 mg/dL — AB (ref 6–23)
CO2: 30 meq/L (ref 19–32)
CREATININE: 1.05 mg/dL (ref 0.40–1.50)
Calcium: 9.4 mg/dL (ref 8.4–10.5)
Chloride: 103 mEq/L (ref 96–112)
GFR: 78.64 mL/min (ref >60.00)
GLUCOSE: 145 mg/dL — AB (ref 70–99)
Potassium: 4.1 mEq/L (ref 3.5–5.1)
SODIUM: 140 meq/L (ref 135–145)
TOTAL PROTEIN: 6.4 g/dL (ref 6.0–8.3)

## 2018-06-02 LAB — TSH: TSH: 0.77 u[IU]/mL (ref 0.35–4.50)

## 2018-06-02 MED ORDER — LORAZEPAM 2 MG PO TABS: ORAL_TABLET | ORAL | 0 refills | Status: DC

## 2018-06-02 MED ORDER — OMEPRAZOLE 20 MG PO CPDR: 20.0000 mg | DELAYED_RELEASE_CAPSULE | Freq: Every day | ORAL | 2 refills | Status: DC

## 2018-06-02 MED ORDER — TRAZODONE HCL 50 MG PO TABS: ORAL_TABLET | ORAL | 2 refills | Status: AC

## 2018-06-02 MED ORDER — TRAZODONE HCL 50 MG PO TABS: ORAL_TABLET | ORAL | 2 refills | Status: DC

## 2018-06-02 NOTE — Patient Instructions (Signed)
It is important that you exercise regularly, at least 20 minutes 3 to 4 times per week.  If you develop chest pain or shortness of breath seek  medical attention.  Neurology and urology follow-up as scheduled  Return in 1 year for follow-up

## 2018-06-02 NOTE — Progress Notes (Signed)
Subjective:    Patient ID: [name], male    DOB: 1965-12-13, 52 y.o.   MRN: [id number]  HPI  52 year old patient who is seen for a preventive health examination. He has a long history of MS and is followed closely by [facility] neurology.  He is also seen locally by urology.  In general doing quite well.  He did have a follow-up colonoscopy last year that did reveal a colonic polyp.  Five-year follow-up was recommended. He is also seen orthopedics recently due to traumatic right shoulder pain.  He states that orthopedics suggested physical therapy and he is asking that we set this up.   Family history Father died at 20 of lung cancer.  Mother died at 57.  One brother and one sister in good health  Past Medical History:  Diagnosis Date  . ALLERGIC RHINITIS 03/27/2010  . Fracture, ribs 12/31/2012   Non-displaced fracture of right 8th-11th ribs  . Gait disorder   . GERD 03/27/2010  . Hx of adenomatous polyp of colon 08/12/2017  . HYPERLIPIDEMIA 03/27/2010  . Insomnia   . MULTIPLE SCLEROSIS, RELAPSING/REMITTING 03/27/2010  . Neurogenic bladder   . NICOTINE ADDICTION 05/24/2010  . Pleural effusion on right 12/31/2012   Secondary to rib fractures  . Rib fracture 11/2012  . TMJ (dislocation of temporomandibular joint)      Social History   Socioeconomic History  . Marital status: Married    Spouse name: Jana Half  . Number of children: 0  . Years of education: Law  . Highest education level: Not on file  Occupational History  . Occupation: Scientist, research (physical sciences): [employer]    Comment: property management  Social Needs  . Financial resource strain: Not on file  . Food insecurity:    Worry: Not on file    Inability: Not on file  . Transportation needs:    Medical: Not on file    Non-medical: Not on file  Tobacco Use  . Smoking status: Former Smoker    Packs/day: 0.50    Years: 20.00    Pack years: 10.00    Types: Cigarettes    Last attempt to quit: 10/16/2012   Years since quitting: 5.6  . Smokeless tobacco: Former Systems developer    Quit date: 10/31/2012  Substance and Sexual Activity  . Alcohol use: Yes    Comment: occasionally  . Drug use: No  . Sexual activity: Not on file  Lifestyle  . Physical activity:    Days per week: Not on file    Minutes per session: Not on file  . Stress: Not on file  Relationships  . Social connections:    Talks on phone: Not on file    Gets together: Not on file    Attends religious service: Not on file    Active member of club or organization: Not on file    Attends meetings of clubs or organizations: Not on file    Relationship status: Not on file  . Intimate partner violence:    Fear of current or ex partner: Not on file    Emotionally abused: Not on file    Physically abused: Not on file    Forced sexual activity: Not on file  Other Topics Concern  . Not on file  Social History Narrative   Pt lives at home with spouse.    Caffeine Use: none    Past Surgical History:  Procedure Laterality Date  . ANKLE SURGERY    .  FOOT SURGERY    . PLEURAL EFFUSION DRAINAGE  01/01/2013   Procedure: DRAINAGE OF PLEURAL EFFUSION;  Surgeon: Rexene Alberts, MD;  Location: [facility] OR;  Service: Thoracic;  Laterality: Right;    Family History  Problem Relation Age of Onset  . Cancer Father        lung ca  . Heart failure Mother   . Colon cancer Neg Hx   . Stomach cancer Neg Hx   . Rectal cancer Neg Hx   . Esophageal cancer Neg Hx     Allergies  Allergen Reactions  . Tramadol Other (See Comments)    GI Upset  . Amoxicillin Swelling  . Ampicillin Swelling  . Hydrocodone-Acetaminophen   . Morphine Swelling  . Oxcarbazepine Rash    Current Outpatient Medications on File Prior to Visit  Medication Sig Dispense Refill  . acetaminophen (TYLENOL) 650 MG CR tablet Take 650 mg by mouth daily.      . bethanechol (URECHOLINE) 50 MG tablet Take 50 mg by mouth.    . Biotin 5000 MCG CAPS Take by mouth.    .  diphenhydramine-acetaminophen (TYLENOL PM) 25-500 MG TABS Take 1 tablet by mouth at bedtime as needed. For sleep    . donepezil (ARICEPT) 10 MG tablet Take 1 tablet (10 mg total) by mouth daily. 90 tablet 2  . finasteride (PROSCAR) 5 MG tablet Take 5 mg by mouth daily.   98  . fluticasone (FLONASE) 50 MCG/ACT nasal spray USE 2 SPRAYS IN EACH NOSTRIL DAILY AS NEEDED FOR ALLERGIES. GENERIC EQUIVALENT FOR FLONASE 16 g 0  . fluticasone (FLONASE) 50 MCG/ACT nasal spray USE 2 SPRAYS IN EACH NOSTRIL DAILY AS NEEDED FOR ALLERGIES. GENERIC EQUIVALENT FOR FLONASE 48 g 0  . gabapentin (NEURONTIN) 800 MG tablet TAKE 1 TABLET BY MOUTH THREE TIMES DAILY 270 tablet 0  . L-Methylfolate-Algae-B12-B6 (METANX) 3-90.314-2-35 MG CAPS Take 2 capsules by mouth daily. 180 capsule 4  . Misc Natural Products (FOCUSED MIND PO) Take 1 tablet by mouth daily.    . Multiple Vitamin (MULTIVITAMIN WITH MINERALS) TABS tablet Take 1 tablet by mouth daily.    . nicotine polacrilex (COMMIT) 4 MG lozenge Place 2 mg inside cheek as needed. For nicotine craving    . sildenafil (VIAGRA) 100 MG tablet sildenafil 100 mg tablet  TK 1 T PO  PRF UP TO 30 DAYS FOR ERECTILE DYSFUNCTION. GEF VIAGRA    . TECFIDERA 240 MG CPDR Take 1 capsule by mouth 2 (two) times daily.   4  . OVER THE COUNTER MEDICATION GNC MEN AND GNC CALCIUM/MAGNESIUM    . Oxcarbazepine (TRILEPTAL) 300 MG tablet Take 300 mg by mouth 2 (two) times daily.      Current Facility-Administered Medications on File Prior to Visit  Medication Dose Route Frequency Provider Last Rate Last Dose  . 0.9 %  sodium chloride infusion  500 mL Intravenous Continuous Gatha Mayer, MD        BP 128/78 (BP Location: Left Arm, Patient Position: Sitting, Cuff Size: Normal)   Pulse 77   Temp 98.7 F (37.1 C) (Oral)   Ht 5\' 11"  (1.803 m)   Wt 196 lb 12.8 oz (89.3 kg)   SpO2 98%   BMI 27.45 kg/m     Review of Systems  Constitutional: Negative for appetite change, chills, fatigue and  fever.  HENT: Negative for congestion, dental problem, ear pain, hearing loss, sore throat, tinnitus, trouble swallowing and voice change.   Eyes: Negative for pain,  discharge and visual disturbance.  Respiratory: Negative for cough, chest tightness, wheezing and stridor.   Cardiovascular: Negative for chest pain, palpitations and leg swelling.  Gastrointestinal: Negative for abdominal distention, abdominal pain, blood in stool, constipation, diarrhea, nausea and vomiting.  Genitourinary: Negative for difficulty urinating, discharge, flank pain, genital sores, hematuria and urgency.  Musculoskeletal: Negative for arthralgias, back pain, gait problem, joint swelling, myalgias and neck stiffness.  Skin: Negative for rash.  Neurological: Negative for dizziness, syncope, speech difficulty, weakness, numbness and headaches.  Hematological: Negative for adenopathy. Does not bruise/bleed easily.  Psychiatric/Behavioral: Positive for confusion and decreased concentration. Negative for behavioral problems and dysphoric mood. The patient is not nervous/anxious.        Objective:   Physical Exam  Constitutional: He is oriented to person, place, and time. He appears well-developed.  HENT:  Head: Normocephalic.  Right Ear: External ear normal.  Left Ear: External ear normal.  Eyes: Conjunctivae and EOM are normal.  Neck: Normal range of motion.  Cardiovascular: Normal rate and normal heart sounds.  Pulmonary/Chest: Breath sounds normal.  Abdominal: Bowel sounds are normal.  Musculoskeletal: Normal range of motion. He exhibits no edema or tenderness.  Neurological: He is alert and oriented to person, place, and time.  Psychiatric: He has a normal mood and affect. His behavior is normal.          Assessment & Plan:   Preventive health examination MS History of colonic polyps Nicotine dependence.  Total smoking cessation encouraged  Anxiety disorder.  Lorazepam refilled  Follow-up [facility]  neurology We will set up physical therapy due to chronic right shoulder pain Review updated lab Follow-up with new provider in 1 year or as needed  Marletta Lor

## 2018-06-04 ENCOUNTER — Other Ambulatory Visit (INDEPENDENT_AMBULATORY_CARE_PROVIDER_SITE_OTHER): Payer: 59

## 2018-06-04 DIAGNOSIS — R739 Hyperglycemia, unspecified: Secondary | ICD-10-CM

## 2018-06-04 LAB — HEMOGLOBIN A1C: Hgb A1c MFr Bld: 5.4 % (ref 4.6–6.5)

## 2018-06-09 DIAGNOSIS — M25511 Pain in right shoulder: Secondary | ICD-10-CM | POA: Diagnosis not present

## 2018-06-16 DIAGNOSIS — M25511 Pain in right shoulder: Secondary | ICD-10-CM | POA: Diagnosis not present

## 2018-06-18 ENCOUNTER — Telehealth: Payer: Self-pay | Admitting: Internal Medicine

## 2018-06-18 NOTE — Telephone Encounter (Signed)
Copied from [place] [id number]. Topic: Quick Communication - See Telephone Encounter >> Jun 18, 2018  9:23 AM Boyd Kerbs wrote: CRM for notification. See Telephone encounter for: 06/18/18.  omeprazole (PRILOSEC) 20 MG capsule 90 capsule 2 06/02/2018   Sig - Route: Take 1 capsule (20 mg total) by mouth daily. - Oral   Terrall Laity - [facility] [phone] option 3 is secure voice mail and can leave accept or denial on this.   Question is on daily dosage as above  or  pt stated he thought it was changed to  [facility]. Wed. Friday and Sat.   If decreased dosage will need a new prescription.

## 2018-06-18 NOTE — Telephone Encounter (Signed)
Please advise if this was supposed to be changed to Mon, Wed, Fri, Sat dosing. I do not see any notes about this. Thanks!

## 2018-06-24 ENCOUNTER — Other Ambulatory Visit: Payer: Self-pay

## 2018-06-24 MED ORDER — OMEPRAZOLE 20 MG PO CPDR: 20.0000 mg | DELAYED_RELEASE_CAPSULE | Freq: Every day | ORAL | 2 refills | Status: AC

## 2018-07-08 DIAGNOSIS — M25511 Pain in right shoulder: Secondary | ICD-10-CM | POA: Diagnosis not present

## 2018-07-16 DIAGNOSIS — G35 Multiple sclerosis: Secondary | ICD-10-CM | POA: Diagnosis not present

## 2018-07-23 ENCOUNTER — Other Ambulatory Visit: Payer: Self-pay | Admitting: Internal Medicine

## 2018-08-03 ENCOUNTER — Other Ambulatory Visit: Payer: 59

## 2018-08-03 ENCOUNTER — Other Ambulatory Visit: Payer: Self-pay | Admitting: Orthopedic Surgery

## 2018-08-03 DIAGNOSIS — M25531 Pain in right wrist: Secondary | ICD-10-CM | POA: Diagnosis not present

## 2018-08-03 DIAGNOSIS — R52 Pain, unspecified: Secondary | ICD-10-CM

## 2018-08-03 DIAGNOSIS — S42111D Displaced fracture of body of scapula, right shoulder, subsequent encounter for fracture with routine healing: Secondary | ICD-10-CM | POA: Diagnosis not present

## 2018-08-03 DIAGNOSIS — M25511 Pain in right shoulder: Secondary | ICD-10-CM | POA: Diagnosis not present

## 2018-08-04 ENCOUNTER — Ambulatory Visit
Admission: RE | Admit: 2018-08-04 | Discharge: 2018-08-04 | Disposition: A | Discharge status: Home / Self Care | Payer: 59 | Source: Ambulatory Visit | Attending: Orthopedic Surgery | Admitting: Orthopedic Surgery

## 2018-08-04 DIAGNOSIS — R52 Pain, unspecified: Secondary | ICD-10-CM

## 2018-08-04 DIAGNOSIS — M19011 Primary osteoarthritis, right shoulder: Secondary | ICD-10-CM | POA: Diagnosis not present

## 2018-08-05 ENCOUNTER — Other Ambulatory Visit: Payer: Self-pay | Admitting: Orthopedic Surgery

## 2018-08-05 DIAGNOSIS — M25511 Pain in right shoulder: Secondary | ICD-10-CM

## 2018-08-17 DIAGNOSIS — D320 Benign neoplasm of cerebral meninges: Secondary | ICD-10-CM | POA: Diagnosis not present

## 2018-08-17 DIAGNOSIS — G35 Multiple sclerosis: Secondary | ICD-10-CM | POA: Diagnosis not present

## 2018-08-22 DIAGNOSIS — M542 Cervicalgia: Secondary | ICD-10-CM | POA: Diagnosis not present

## 2018-08-24 DIAGNOSIS — M25511 Pain in right shoulder: Secondary | ICD-10-CM | POA: Diagnosis not present

## 2018-08-24 DIAGNOSIS — M542 Cervicalgia: Secondary | ICD-10-CM | POA: Diagnosis not present

## 2018-08-28 ENCOUNTER — Other Ambulatory Visit: Payer: Self-pay | Admitting: Neurosurgery

## 2018-08-28 DIAGNOSIS — M542 Cervicalgia: Secondary | ICD-10-CM | POA: Diagnosis not present

## 2018-08-28 DIAGNOSIS — M5030 Other cervical disc degeneration, unspecified cervical region: Secondary | ICD-10-CM | POA: Diagnosis not present

## 2018-08-28 DIAGNOSIS — M4722 Other spondylosis with radiculopathy, cervical region: Secondary | ICD-10-CM | POA: Diagnosis not present

## 2018-08-28 DIAGNOSIS — M5020 Other cervical disc displacement, unspecified cervical region: Secondary | ICD-10-CM | POA: Diagnosis not present

## 2018-08-31 ENCOUNTER — Other Ambulatory Visit: Payer: Self-pay | Admitting: Neurosurgery

## 2018-09-01 ENCOUNTER — Other Ambulatory Visit: Payer: Self-pay

## 2018-09-01 ENCOUNTER — Encounter (HOSPITAL_COMMUNITY)
Admission: RE | Admit: 2018-09-01 | Discharge: 2018-09-01 | Disposition: A | Discharge status: Home / Self Care | Payer: 59 | Source: Ambulatory Visit | Attending: Neurosurgery | Admitting: Neurosurgery

## 2018-09-01 ENCOUNTER — Encounter (HOSPITAL_COMMUNITY): Payer: Self-pay

## 2018-09-01 DIAGNOSIS — M4722 Other spondylosis with radiculopathy, cervical region: Secondary | ICD-10-CM | POA: Diagnosis not present

## 2018-09-01 DIAGNOSIS — Z79899 Other long term (current) drug therapy: Secondary | ICD-10-CM | POA: Diagnosis not present

## 2018-09-01 DIAGNOSIS — Z86010 Personal history of colonic polyps: Secondary | ICD-10-CM | POA: Diagnosis not present

## 2018-09-01 DIAGNOSIS — J309 Allergic rhinitis, unspecified: Secondary | ICD-10-CM | POA: Diagnosis not present

## 2018-09-01 DIAGNOSIS — R269 Unspecified abnormalities of gait and mobility: Secondary | ICD-10-CM | POA: Diagnosis not present

## 2018-09-01 DIAGNOSIS — E785 Hyperlipidemia, unspecified: Secondary | ICD-10-CM | POA: Diagnosis not present

## 2018-09-01 DIAGNOSIS — G629 Polyneuropathy, unspecified: Secondary | ICD-10-CM | POA: Diagnosis not present

## 2018-09-01 DIAGNOSIS — Z87891 Personal history of nicotine dependence: Secondary | ICD-10-CM | POA: Diagnosis not present

## 2018-09-01 DIAGNOSIS — G4700 Insomnia, unspecified: Secondary | ICD-10-CM | POA: Diagnosis not present

## 2018-09-01 DIAGNOSIS — K3184 Gastroparesis: Secondary | ICD-10-CM | POA: Diagnosis not present

## 2018-09-01 DIAGNOSIS — J90 Pleural effusion, not elsewhere classified: Secondary | ICD-10-CM | POA: Diagnosis not present

## 2018-09-01 DIAGNOSIS — K219 Gastro-esophageal reflux disease without esophagitis: Secondary | ICD-10-CM | POA: Diagnosis not present

## 2018-09-01 DIAGNOSIS — W11XXXA Fall on and from ladder, initial encounter: Secondary | ICD-10-CM | POA: Diagnosis not present

## 2018-09-01 DIAGNOSIS — M50122 Cervical disc disorder at C5-C6 level with radiculopathy: Secondary | ICD-10-CM | POA: Diagnosis not present

## 2018-09-01 DIAGNOSIS — G35 Multiple sclerosis: Secondary | ICD-10-CM | POA: Diagnosis not present

## 2018-09-01 DIAGNOSIS — Z01812 Encounter for preprocedural laboratory examination: Secondary | ICD-10-CM | POA: Insufficient documentation

## 2018-09-01 DIAGNOSIS — Z885 Allergy status to narcotic agent status: Secondary | ICD-10-CM | POA: Diagnosis not present

## 2018-09-01 HISTORY — DX: Polyneuropathy, unspecified: G62.9

## 2018-09-01 HISTORY — DX: Gastroparesis: K31.84

## 2018-09-01 LAB — BASIC METABOLIC PANEL
Anion gap: 13 (ref 5–15)
BUN: 14 mg/dL (ref 6–20)
CO2: 25 mmol/L (ref 22–32)
CREATININE: 0.96 mg/dL (ref 0.61–1.24)
Calcium: 9.7 mg/dL (ref 8.9–10.3)
Chloride: 102 mmol/L (ref 98–111)
Glucose, Bld: 106 mg/dL — ABNORMAL HIGH (ref 70–99)
POTASSIUM: 3.6 mmol/L (ref 3.5–5.1)
SODIUM: 140 mmol/L (ref 135–145)

## 2018-09-01 LAB — SURGICAL PCR SCREEN
MRSA, PCR: NEGATIVE
Staphylococcus aureus: NEGATIVE

## 2018-09-01 LAB — TYPE AND SCREEN
ABO/RH(D): A POS
Antibody Screen: NEGATIVE

## 2018-09-01 LAB — CBC
HCT: 47.1 % (ref 39.0–52.0)
Hemoglobin: 15.3 g/dL (ref 13.0–17.0)
MCH: 30.9 pg (ref 26.0–34.0)
MCHC: 32.5 g/dL (ref 30.0–36.0)
MCV: 95.2 fL (ref 78.0–100.0)
PLATELETS: 224 10*3/uL (ref 150–400)
RBC: 4.95 MIL/uL (ref 4.22–5.81)
RDW: 12.8 % (ref 11.5–15.5)
WBC: 8.9 10*3/uL (ref 4.0–10.5)

## 2018-09-01 NOTE — Progress Notes (Signed)
Anesthesia PAT Evaluation:   Case:  [id number] Date/Time:  09/02/18 0715   Procedure:  ANTERIOR CERVICAL DECOMPRESSION/DISCECTOMY FUSION CERVICAL 5- CERVICAL 6 (N/A ) - ANTERIOR CERVICAL DECOMPRESSION/DISCECTOMY FUSION CERVICAL 5- CERVICAL 6   Anesthesia type:  General   Pre-op diagnosis:  HERNIATED NUCLEUS PULPOSUS, CERVICAL   Location:  [place] OR ROOM 19 / [place] OR   Surgeon:  Jovita Gamma, MD      DISCUSSION: Patient is a 52 year old male scheduled for the above procedure. Surgery was booked within a week of him seeing Dr. Sherwood Gambler as he is concerned about irreversible RUE motor deficits (see 08/31/18 notation by Dr. Sherwood Gambler, on chart).  History includes former smoker (quit '13), Multiple Sclerosis (relapsing/remitting, diagnosed ~ '04; manifestations include short term memory loss/cognitive impairment, insomnia, sexual dysfunction, bowel difficulties, gait disorder), neurogenic bladder (does not have to self-cath), HLD, GERD, fall with right 8-11 rib fractures s/p right pleural effusion (s/p right VATS, drainage 01/01/13), right frontal meningioma (tiny by 08/18/18 MRI), right scapular fracture (12/2017 fall; followed by Dr. Amedeo Plenty), gastroparesis.   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 His wife is Chevy Sweigert, known professionally as Rosemary Holms, DPM. She was not with him during his PAT visit, but will be with him on the day of surgery.  Anesthesia concerns/requests: Responses (as discussed with anesthesiologist Oren Bracket, MD)  He developed aspiration pneumonia related to ankle surgery ~ 2008/2009 at [facility]. He was NPO for at least 12 hours. His wife believes he had an ETT, but not completely sure and records not currently available. This required a prolonged ICU/hospital stay. Patient requested preoperative Reglan. (His family friend anesthesiologist Dr. Sharma Covert had inquired about preoperative PO Reglan and then IV Reglan on  the morning of surgery given patient's aspiration and gastroparesis history.) He already plans to be NPO for 10-12 hours prior to surgery: No heavy meal tonight. He may have clear liquids up to MN and take meds with sips of water (he will likely only take Tecfidera and Prilosec to minimize intake). Cannot prescribe medications (ie, home prescription) prior to surgery, but following anesthesiologist evaluation, IV Reglan can be considered for motility along with anti-emetics but will ultimately be the decision by his assigned anesthesiologist following evaluation.   MS history will be taken into consideration with his anesthesia plan.   He requests either anesthesiologists Dr. Linna Caprice, Dr. Tobias Alexander, Dr. Roanna Banning, or Dr. Therisa Doyne Case will likely be assigned to Dr. Linna Caprice, but if unable will be assigned to another requested anesthesiologist.    Patient overall feels at his baseline. He has had decreased RUE movement since his fall ~ 12/2017. He tends to fatigue by the afternoon due to MS, but ambulates independently. No chest pain, SOB, edema. At times he will have some dysphagia. He is frustrated most with his short-term memory loss, so he carries around a notepad. Other than aspiration with 2008/2009 surgery, he is not aware of other anesthesia/surgical complications.    VS: BP (!) 144/86   Pulse 81   Temp 36.7 C   Resp 20   Ht 5' 11.5" (1.816 m)   Wt 86.3 kg   SpO2 98%   BMI 26.16 kg/m  Patient is alert, cooperative and in NAD. Heart RRR, no murmur note. Lungs clear. No ankle edema. Mallampati I.   PROVIDERS: Marletta Lor, MD is PCP. Last visit 06/02/18.  Diamantina Monks, MD is neurologist (see [place]). Last visit  07/16/18.  Alona Bene, MD is urologist (see [place]).   LABS: Labs reviewed: Acceptable for surgery. (all labs ordered are listed, but only abnormal results are displayed)  Labs Reviewed  BASIC METABOLIC PANEL - Abnormal; Notable for the  following components:      Result Value   Glucose, Bld 106 (*)    All other components within normal limits  SURGICAL PCR SCREEN  CBC  TYPE AND SCREEN    IMAGES: MRI head 08/18/18 ([place]): IMPRESSION: 1.Multiple T2-weighted lesions scattered throughout both cerebral hemispheres, LEFT thalamus and LEFT cerebellum with atrophy of the corpus callosum. This is unchanged and consistent the patient's known history of a demyelinating process. No new  lesions appreciated. 2.Tiny RIGHT frontal lobe meningioma.   EKG: N/A   CV: N/A   Past Medical History:  Diagnosis Date  . ALLERGIC RHINITIS 03/27/2010  . Fracture, ribs 12/31/2012   Non-displaced fracture of right 8th-11th ribs  . Gait disorder   . Gastroparesis   . GERD 03/27/2010  . Hx of adenomatous polyp of colon 08/12/2017  . HYPERLIPIDEMIA 03/27/2010  . Insomnia   . MULTIPLE SCLEROSIS, RELAPSING/REMITTING 03/27/2010  . Neurogenic bladder   . NICOTINE ADDICTION 05/24/2010  . Peripheral neuropathy   . Pleural effusion on right 12/31/2012   Secondary to rib fractures  . Rib fracture 11/2012  . TMJ (dislocation of temporomandibular joint)     Past Surgical History:  Procedure Laterality Date  . ANKLE SURGERY    . FOOT SURGERY    . INCISION AND DRAINAGE OF WOUND  2015  . PLEURAL EFFUSION DRAINAGE  01/01/2013   Procedure: DRAINAGE OF PLEURAL EFFUSION;  Surgeon: Rexene Alberts, MD;  Location: [facility] OR;  Service: Thoracic;  Laterality: Right;    MEDICATIONS: . acetaminophen (TYLENOL) 500 MG tablet  . bethanechol (URECHOLINE) 50 MG tablet  . Biotin 5000 MCG CAPS  . diphenhydramine-acetaminophen (TYLENOL PM) 25-500 MG TABS  . donepezil (ARICEPT) 10 MG tablet  . finasteride (PROSCAR) 5 MG tablet  . fluticasone (FLONASE) 50 MCG/ACT nasal spray  . fluticasone (FLONASE) 50 MCG/ACT nasal spray  . gabapentin (NEURONTIN) 800 MG tablet  . ibuprofen (ADVIL,MOTRIN) 800 MG tablet  . L-Methylfolate-Algae-B12-B6 (METANX)  3-90.314-2-35 MG CAPS  . LORazepam (ATIVAN) 2 MG tablet  . Misc Natural Products (FOCUSED MIND PO)  . Multiple Vitamin (MULTIVITAMIN WITH MINERALS) TABS tablet  . nicotine polacrilex (COMMIT) 4 MG lozenge  . omeprazole (PRILOSEC) 20 MG capsule  . OVER THE COUNTER MEDICATION  . OVER THE COUNTER MEDICATION  . OVER THE COUNTER MEDICATION  . Oxcarbazepine (TRILEPTAL) 300 MG tablet  . sildenafil (VIAGRA) 100 MG tablet  . TECFIDERA 240 MG CPDR  . traZODone (DESYREL) 50 MG tablet   . 0.9 %  sodium chloride infusion  OTC meds include GNC Mega Man, GNC Men and GNC Ca/Mg, GNC Triflex.   George Hugh [facility] Short Stay Center/Anesthesiology Phone [phone] 09/01/2018 6:30 PM

## 2018-09-01 NOTE — Pre-Procedure Instructions (Addendum)
[name]  09/01/2018      Your procedure is scheduled on September 02, 2018.  Report to [facility] Admitting at 05:30 A.M.  Call this number if you have problems the morning of surgery:  [phone]   Remember:  Do not eat or drink after midnight.      Take these medicines the morning of surgery with A SIP OF WATER : Tylenol if needed Bethanechol (Urecholine) Finasteride (Proscar) Flonase nasal spray Gabapentin (Neurontin) Lorazepam (Ativan) if needed Omeprazole (Prilosec) Tecfidera  7 days prior to surgery STOP taking any Aspirin (unless otherwise instructed by your surgeon), Aleve, Naproxen, Ibuprofen, Motrin, Advil, Goody's, BC's, all herbal medications, fish oil, and all vitamins.    Do not wear jewelry.  Do not wear lotions, powders, cologne, or deodorant.  Do not shave 48 hours prior to surgery.  Men may shave face and neck.  Do not bring valuables to the hospital.   [facility] is not responsible for any belongings or valuables.  Contacts, dentures or bridgework may not be worn into surgery.  Leave your suitcase in the car.  After surgery it may be brought to your room.  For patients admitted to the hospital, discharge time will be determined by your treatment team.  Patients discharged the day of surgery will not be allowed to drive home.   Special instructions:   [place]- Preparing For Surgery  Before surgery, you can play an important role. Because skin is not sterile, your skin needs to be as free of germs as possible. You can reduce the number of germs on your skin by washing with CHG (chlorahexidine gluconate) Soap before surgery.  CHG is an antiseptic cleaner which kills germs and bonds with the skin to continue killing germs even after washing.    Oral Hygiene is also important to reduce your risk of infection.  Remember - BRUSH YOUR TEETH THE MORNING OF SURGERY WITH YOUR REGULAR TOOTHPASTE  Please do not use if you have an  allergy to CHG or antibacterial soaps. If your skin becomes reddened/irritated stop using the CHG.  Do not shave (including legs and underarms) for at least 48 hours prior to first CHG shower. It is OK to shave your face.  Please follow these instructions carefully.   1. Shower the NIGHT BEFORE SURGERY and the MORNING OF SURGERY with CHG.   2. If you chose to wash your hair, wash your hair first as usual with your normal shampoo.  3. After you shampoo, rinse your hair and body thoroughly to remove the shampoo.  4. Use CHG as you would any other liquid soap. You can apply CHG directly to the skin and wash gently with a scrungie or a clean washcloth.   5. Apply the CHG Soap to your body ONLY FROM THE NECK DOWN.  Do not use on open wounds or open sores. Avoid contact with your eyes, ears, mouth and genitals (private parts). Wash Face and genitals (private parts)  with your normal soap.  6. Wash thoroughly, paying special attention to the area where your surgery will be performed.  7. Thoroughly rinse your body with warm water from the neck down.  8. DO NOT shower/wash with your normal soap after using and rinsing off the CHG Soap.  9. Pat yourself dry with a CLEAN TOWEL.  10. Wear CLEAN PAJAMAS to bed the night before surgery, wear comfortable clothes the morning of surgery  11. Place CLEAN SHEETS on your  bed the night of your first shower and DO NOT SLEEP WITH PETS.    Day of Surgery:  Do not apply any deodorants/lotions.  Please wear clean clothes to the hospital/surgery center.   Remember to brush your teeth WITH YOUR REGULAR TOOTHPASTE.    Please read over the following fact sheets that you were given.

## 2018-09-02 ENCOUNTER — Observation Stay (HOSPITAL_COMMUNITY)
Admission: RE | Admit: 2018-09-02 | Discharge: 2018-09-02 | Disposition: A | Discharge status: Home / Self Care | Payer: 59 | Source: Ambulatory Visit | Attending: Neurosurgery | Admitting: Neurosurgery

## 2018-09-02 ENCOUNTER — Ambulatory Visit (HOSPITAL_COMMUNITY): Payer: 59

## 2018-09-02 ENCOUNTER — Other Ambulatory Visit (HOSPITAL_COMMUNITY): Payer: 59

## 2018-09-02 ENCOUNTER — Ambulatory Visit (HOSPITAL_COMMUNITY): Payer: 59 | Admitting: Physician Assistant

## 2018-09-02 ENCOUNTER — Encounter (HOSPITAL_COMMUNITY)
Admission: RE | Disposition: A | Discharge status: Home / Self Care | Payer: Self-pay | Source: Ambulatory Visit | Attending: Neurosurgery

## 2018-09-02 ENCOUNTER — Ambulatory Visit (HOSPITAL_COMMUNITY): Payer: 59 | Admitting: Anesthesiology

## 2018-09-02 ENCOUNTER — Encounter (HOSPITAL_COMMUNITY): Payer: Self-pay | Admitting: Anesthesiology

## 2018-09-02 DIAGNOSIS — R269 Unspecified abnormalities of gait and mobility: Secondary | ICD-10-CM | POA: Insufficient documentation

## 2018-09-02 DIAGNOSIS — Z79899 Other long term (current) drug therapy: Secondary | ICD-10-CM | POA: Insufficient documentation

## 2018-09-02 DIAGNOSIS — J90 Pleural effusion, not elsewhere classified: Secondary | ICD-10-CM | POA: Insufficient documentation

## 2018-09-02 DIAGNOSIS — M4322 Fusion of spine, cervical region: Secondary | ICD-10-CM | POA: Diagnosis not present

## 2018-09-02 DIAGNOSIS — M5020 Other cervical disc displacement, unspecified cervical region: Secondary | ICD-10-CM | POA: Diagnosis present

## 2018-09-02 DIAGNOSIS — W11XXXA Fall on and from ladder, initial encounter: Secondary | ICD-10-CM | POA: Insufficient documentation

## 2018-09-02 DIAGNOSIS — Z87891 Personal history of nicotine dependence: Secondary | ICD-10-CM | POA: Insufficient documentation

## 2018-09-02 DIAGNOSIS — E785 Hyperlipidemia, unspecified: Secondary | ICD-10-CM | POA: Diagnosis not present

## 2018-09-02 DIAGNOSIS — Z86010 Personal history of colonic polyps: Secondary | ICD-10-CM | POA: Insufficient documentation

## 2018-09-02 DIAGNOSIS — G4700 Insomnia, unspecified: Secondary | ICD-10-CM | POA: Insufficient documentation

## 2018-09-02 DIAGNOSIS — M50122 Cervical disc disorder at C5-C6 level with radiculopathy: Principal | ICD-10-CM | POA: Insufficient documentation

## 2018-09-02 DIAGNOSIS — K3184 Gastroparesis: Secondary | ICD-10-CM | POA: Insufficient documentation

## 2018-09-02 DIAGNOSIS — G629 Polyneuropathy, unspecified: Secondary | ICD-10-CM | POA: Insufficient documentation

## 2018-09-02 DIAGNOSIS — J309 Allergic rhinitis, unspecified: Secondary | ICD-10-CM | POA: Insufficient documentation

## 2018-09-02 DIAGNOSIS — Z419 Encounter for procedure for purposes other than remedying health state, unspecified: Secondary | ICD-10-CM

## 2018-09-02 DIAGNOSIS — K219 Gastro-esophageal reflux disease without esophagitis: Secondary | ICD-10-CM | POA: Insufficient documentation

## 2018-09-02 DIAGNOSIS — M4722 Other spondylosis with radiculopathy, cervical region: Secondary | ICD-10-CM | POA: Insufficient documentation

## 2018-09-02 DIAGNOSIS — Z885 Allergy status to narcotic agent status: Secondary | ICD-10-CM | POA: Insufficient documentation

## 2018-09-02 DIAGNOSIS — G35 Multiple sclerosis: Secondary | ICD-10-CM | POA: Insufficient documentation

## 2018-09-02 HISTORY — PX: ANTERIOR CERVICAL DECOMP/DISCECTOMY FUSION: SHX1161

## 2018-09-02 SURGERY — ANTERIOR CERVICAL DECOMPRESSION/DISCECTOMY FUSION 1 LEVEL
Anesthesia: General | Site: Spine Cervical

## 2018-09-02 MED ORDER — ACETAMINOPHEN 650 MG RE SUPP: 650.0000 mg | RECTAL | Status: DC | PRN

## 2018-09-02 MED ORDER — PHENOL 1.4 % MT LIQD: 1.0000 | OROMUCOSAL | Status: DC | PRN

## 2018-09-02 MED ORDER — THROMBIN 5000 UNITS EX SOLR
CUTANEOUS | Status: AC
  Filled 2018-09-02: qty 15000

## 2018-09-02 MED ORDER — LIDOCAINE-EPINEPHRINE 1 %-1:100000 IJ SOLN
INTRAMUSCULAR | Status: AC
  Filled 2018-09-02: qty 1

## 2018-09-02 MED ORDER — HEMOSTATIC AGENTS (NO CHARGE) OPTIME
TOPICAL | Status: DC | PRN
  Administered 2018-09-02: 1 via TOPICAL

## 2018-09-02 MED ORDER — ACETAMINOPHEN 325 MG PO TABS: 650.0000 mg | ORAL_TABLET | ORAL | Status: DC | PRN

## 2018-09-02 MED ORDER — ROCURONIUM BROMIDE 10 MG/ML (PF) SYRINGE
PREFILLED_SYRINGE | INTRAVENOUS | Status: DC | PRN
  Administered 2018-09-02: 10 mg via INTRAVENOUS
  Administered 2018-09-02 (×2): 50 mg via INTRAVENOUS
  Administered 2018-09-02: 20 mg via INTRAVENOUS

## 2018-09-02 MED ORDER — EPHEDRINE SULFATE-NACL 50-0.9 MG/10ML-% IV SOSY
PREFILLED_SYRINGE | INTRAVENOUS | Status: DC | PRN
  Administered 2018-09-02 (×2): 10 mg via INTRAVENOUS

## 2018-09-02 MED ORDER — PROPOFOL 10 MG/ML IV BOLUS
INTRAVENOUS | Status: DC | PRN
  Administered 2018-09-02: 150 mg via INTRAVENOUS

## 2018-09-02 MED ORDER — GENTAMICIN IN SALINE 1.6-0.9 MG/ML-% IV SOLN
80.0000 mg | INTRAVENOUS | Status: AC
  Administered 2018-09-02: 80 mg via INTRAVENOUS

## 2018-09-02 MED ORDER — LIDOCAINE 2% (20 MG/ML) 5 ML SYRINGE
INTRAMUSCULAR | Status: AC
  Filled 2018-09-02: qty 5

## 2018-09-02 MED ORDER — DEXAMETHASONE SODIUM PHOSPHATE 10 MG/ML IJ SOLN
INTRAMUSCULAR | Status: AC
  Filled 2018-09-02: qty 1

## 2018-09-02 MED ORDER — SODIUM CHLORIDE 0.9% FLUSH: 3.0000 mL | Freq: Two times a day (BID) | INTRAVENOUS | Status: DC

## 2018-09-02 MED ORDER — LIDOCAINE 2% (20 MG/ML) 5 ML SYRINGE
INTRAMUSCULAR | Status: DC | PRN
  Administered 2018-09-02: 60 mg via INTRAVENOUS

## 2018-09-02 MED ORDER — FENTANYL CITRATE (PF) 250 MCG/5ML IJ SOLN
INTRAMUSCULAR | Status: DC | PRN
  Administered 2018-09-02 (×2): 25 ug via INTRAVENOUS
  Administered 2018-09-02 (×4): 50 ug via INTRAVENOUS

## 2018-09-02 MED ORDER — BUPIVACAINE HCL (PF) 0.5 % IJ SOLN
INTRAMUSCULAR | Status: DC | PRN
  Administered 2018-09-02: 3.75 mL

## 2018-09-02 MED ORDER — ALUM & MAG HYDROXIDE-SIMETH 200-200-20 MG/5ML PO SUSP: 30.0000 mL | Freq: Four times a day (QID) | ORAL | Status: DC | PRN

## 2018-09-02 MED ORDER — DONEPEZIL HCL 10 MG PO TABS
10.0000 mg | ORAL_TABLET | Freq: Every day | ORAL | Status: DC
  Administered 2018-09-02: 10 mg via ORAL
  Filled 2018-09-02: qty 1

## 2018-09-02 MED ORDER — FLUTICASONE PROPIONATE 50 MCG/ACT NA SUSP
2.0000 | Freq: Every day | NASAL | Status: DC
  Filled 2018-09-02: qty 16

## 2018-09-02 MED ORDER — FENTANYL CITRATE (PF) 100 MCG/2ML IJ SOLN: 25.0000 ug | INTRAMUSCULAR | Status: DC | PRN

## 2018-09-02 MED ORDER — BISACODYL 10 MG RE SUPP: 10.0000 mg | Freq: Every day | RECTAL | Status: DC | PRN

## 2018-09-02 MED ORDER — BETHANECHOL CHLORIDE 25 MG PO TABS
50.0000 mg | ORAL_TABLET | Freq: Four times a day (QID) | ORAL | Status: DC
  Administered 2018-09-02: 50 mg via ORAL
  Filled 2018-09-02 (×3): qty 2

## 2018-09-02 MED ORDER — CHLORHEXIDINE GLUCONATE CLOTH 2 % EX PADS: 6.0000 | MEDICATED_PAD | Freq: Once | CUTANEOUS | Status: DC

## 2018-09-02 MED ORDER — SODIUM CHLORIDE 0.9 % IV SOLN: 250.0000 mL | INTRAVENOUS | Status: DC

## 2018-09-02 MED ORDER — ACETAMINOPHEN 10 MG/ML IV SOLN
INTRAVENOUS | Status: DC | PRN
  Administered 2018-09-02: 1000 mg via INTRAVENOUS

## 2018-09-02 MED ORDER — DIPHENHYDRAMINE HCL 50 MG/ML IJ SOLN
INTRAMUSCULAR | Status: AC
  Administered 2018-09-02: 25 mg via INTRAVENOUS
  Filled 2018-09-02: qty 1

## 2018-09-02 MED ORDER — THROMBIN 5000 UNITS EX SOLR
OROMUCOSAL | Status: DC | PRN
  Administered 2018-09-02: 5 mL via TOPICAL

## 2018-09-02 MED ORDER — VANCOMYCIN HCL IN DEXTROSE 1-5 GM/200ML-% IV SOLN
1000.0000 mg | INTRAVENOUS | Status: AC
  Administered 2018-09-02: 1000 mg via INTRAVENOUS
  Filled 2018-09-02: qty 200

## 2018-09-02 MED ORDER — KCL IN DEXTROSE-NACL 20-5-0.45 MEQ/L-%-% IV SOLN: INTRAVENOUS | Status: DC

## 2018-09-02 MED ORDER — LIDOCAINE-EPINEPHRINE 1 %-1:100000 IJ SOLN
INTRAMUSCULAR | Status: DC | PRN
  Administered 2018-09-02: 3.75 mL

## 2018-09-02 MED ORDER — LORAZEPAM 0.5 MG PO TABS: 1.0000 mg | ORAL_TABLET | Freq: Three times a day (TID) | ORAL | Status: DC | PRN

## 2018-09-02 MED ORDER — TRAZODONE HCL 100 MG PO TABS
100.0000 mg | ORAL_TABLET | Freq: Every day | ORAL | Status: DC
  Filled 2018-09-02: qty 1

## 2018-09-02 MED ORDER — ROCURONIUM BROMIDE 50 MG/5ML IV SOSY
PREFILLED_SYRINGE | INTRAVENOUS | Status: AC
  Filled 2018-09-02: qty 20

## 2018-09-02 MED ORDER — SODIUM CHLORIDE 0.9% FLUSH: 3.0000 mL | INTRAVENOUS | Status: DC | PRN

## 2018-09-02 MED ORDER — THROMBIN 5000 UNITS EX SOLR
CUTANEOUS | Status: DC | PRN
  Administered 2018-09-02 (×2): 5000 [IU] via TOPICAL

## 2018-09-02 MED ORDER — HYDROXYZINE HCL 25 MG PO TABS: 50.0000 mg | ORAL_TABLET | ORAL | Status: DC | PRN

## 2018-09-02 MED ORDER — PROPOFOL 10 MG/ML IV BOLUS
INTRAVENOUS | Status: AC
  Filled 2018-09-02: qty 20

## 2018-09-02 MED ORDER — MIDAZOLAM HCL 2 MG/2ML IJ SOLN
INTRAMUSCULAR | Status: AC
  Filled 2018-09-02: qty 2

## 2018-09-02 MED ORDER — ONDANSETRON HCL 4 MG/2ML IJ SOLN
INTRAMUSCULAR | Status: DC | PRN
  Administered 2018-09-02: 4 mg via INTRAVENOUS

## 2018-09-02 MED ORDER — SUGAMMADEX SODIUM 200 MG/2ML IV SOLN
INTRAVENOUS | Status: DC | PRN
  Administered 2018-09-02: 200 mg via INTRAVENOUS

## 2018-09-02 MED ORDER — LACTATED RINGERS IV SOLN
Freq: Once | INTRAVENOUS | Status: AC
  Administered 2018-09-02: 07:00:00 via INTRAVENOUS

## 2018-09-02 MED ORDER — 0.9 % SODIUM CHLORIDE (POUR BTL) OPTIME
TOPICAL | Status: DC | PRN
  Administered 2018-09-02: 1000 mL

## 2018-09-02 MED ORDER — DIPHENHYDRAMINE HCL 50 MG/ML IJ SOLN
25.0000 mg | Freq: Once | INTRAMUSCULAR | Status: AC
  Administered 2018-09-02: 25 mg via INTRAVENOUS
  Filled 2018-09-02: qty 0.5

## 2018-09-02 MED ORDER — HYDROXYZINE HCL 50 MG/ML IM SOLN: 50.0000 mg | INTRAMUSCULAR | Status: DC | PRN

## 2018-09-02 MED ORDER — ACETAMINOPHEN 10 MG/ML IV SOLN
INTRAVENOUS | Status: AC
  Filled 2018-09-02: qty 100

## 2018-09-02 MED ORDER — OXYCODONE HCL 5 MG PO TABS
5.0000 mg | ORAL_TABLET | ORAL | Status: DC | PRN
  Administered 2018-09-02: 5 mg via ORAL
  Filled 2018-09-02: qty 1

## 2018-09-02 MED ORDER — KETOROLAC TROMETHAMINE 30 MG/ML IJ SOLN
30.0000 mg | Freq: Once | INTRAMUSCULAR | Status: AC
  Administered 2018-09-02: 30 mg via INTRAVENOUS

## 2018-09-02 MED ORDER — MAGNESIUM HYDROXIDE 400 MG/5ML PO SUSP: 30.0000 mL | Freq: Every day | ORAL | Status: DC | PRN

## 2018-09-02 MED ORDER — OXYCODONE HCL 5 MG PO TABS: 5.0000 mg | ORAL_TABLET | ORAL | 0 refills | Status: DC | PRN

## 2018-09-02 MED ORDER — KETOROLAC TROMETHAMINE 30 MG/ML IJ SOLN: 30.0000 mg | Freq: Four times a day (QID) | INTRAMUSCULAR | Status: DC

## 2018-09-02 MED ORDER — DIMETHYL FUMARATE 240 MG PO CPDR: 240.0000 mg | DELAYED_RELEASE_CAPSULE | Freq: Two times a day (BID) | ORAL | Status: DC

## 2018-09-02 MED ORDER — BUPIVACAINE HCL (PF) 0.5 % IJ SOLN
INTRAMUSCULAR | Status: AC
  Filled 2018-09-02: qty 30

## 2018-09-02 MED ORDER — HYDROMORPHONE HCL 1 MG/ML IJ SOLN: 1.0000 mg | INTRAMUSCULAR | Status: DC | PRN

## 2018-09-02 MED ORDER — SODIUM CHLORIDE 0.9 % IV SOLN
INTRAVENOUS | Status: DC | PRN
  Administered 2018-09-02: 500 mL

## 2018-09-02 MED ORDER — ONDANSETRON HCL 4 MG/2ML IJ SOLN
INTRAMUSCULAR | Status: AC
  Filled 2018-09-02: qty 2

## 2018-09-02 MED ORDER — GABAPENTIN 400 MG PO CAPS
800.0000 mg | ORAL_CAPSULE | Freq: Three times a day (TID) | ORAL | Status: DC
  Administered 2018-09-02: 800 mg via ORAL
  Filled 2018-09-02: qty 2

## 2018-09-02 MED ORDER — CYCLOBENZAPRINE HCL 5 MG PO TABS: 5.0000 mg | ORAL_TABLET | Freq: Three times a day (TID) | ORAL | Status: DC | PRN

## 2018-09-02 MED ORDER — DEXAMETHASONE SODIUM PHOSPHATE 10 MG/ML IJ SOLN
INTRAMUSCULAR | Status: DC | PRN
  Administered 2018-09-02: 10 mg via INTRAVENOUS

## 2018-09-02 MED ORDER — FLEET ENEMA 7-19 GM/118ML RE ENEM: 1.0000 | ENEMA | Freq: Once | RECTAL | Status: DC | PRN

## 2018-09-02 MED ORDER — FENTANYL CITRATE (PF) 250 MCG/5ML IJ SOLN
INTRAMUSCULAR | Status: AC
  Filled 2018-09-02: qty 5

## 2018-09-02 MED ORDER — ONDANSETRON HCL 4 MG/2ML IJ SOLN: 4.0000 mg | Freq: Once | INTRAMUSCULAR | Status: DC | PRN

## 2018-09-02 MED ORDER — KETOROLAC TROMETHAMINE 30 MG/ML IJ SOLN
INTRAMUSCULAR | Status: AC
  Filled 2018-09-02: qty 1

## 2018-09-02 MED ORDER — EPHEDRINE 5 MG/ML INJ
INTRAVENOUS | Status: AC
  Filled 2018-09-02: qty 10

## 2018-09-02 MED ORDER — MIDAZOLAM HCL 5 MG/5ML IJ SOLN
INTRAMUSCULAR | Status: DC | PRN
  Administered 2018-09-02: 2 mg via INTRAVENOUS

## 2018-09-02 MED ORDER — MENTHOL 3 MG MT LOZG: 1.0000 | LOZENGE | OROMUCOSAL | Status: DC | PRN

## 2018-09-02 MED ORDER — LACTATED RINGERS IV SOLN
INTRAVENOUS | Status: DC | PRN
  Administered 2018-09-02: 08:00:00 via INTRAVENOUS

## 2018-09-02 MED ORDER — FINASTERIDE 5 MG PO TABS
5.0000 mg | ORAL_TABLET | Freq: Every day | ORAL | Status: DC
  Administered 2018-09-02: 5 mg via ORAL
  Filled 2018-09-02: qty 1

## 2018-09-02 MED ORDER — SODIUM CHLORIDE 0.9 % IV SOLN
INTRAVENOUS | Status: DC | PRN
  Administered 2018-09-02: 40 ug/min via INTRAVENOUS

## 2018-09-02 SURGICAL SUPPLY — 65 items
ADH SKN CLS APL DERMABOND .7 (GAUZE/BANDAGES/DRESSINGS) ×1
ALLOGRAFT 7X14X11 (Bone Implant) ×1 IMPLANT
BAG DECANTER FOR FLEXI CONT (MISCELLANEOUS) ×2 IMPLANT
BIT DRILL 14X2.5XNS TI ANT (BIT) IMPLANT
BIT DRILL AVIATOR 14 (BIT) ×2
BIT DRILL NEURO 2X3.1 SFT TUCH (MISCELLANEOUS) ×1 IMPLANT
BIT DRL 14X2.5XNS TI ANT (BIT) ×1
BLADE ULTRA TIP 2M (BLADE) IMPLANT
CANISTER SUCT 3000ML PPV (MISCELLANEOUS) ×2 IMPLANT
CARTRIDGE OIL MAESTRO DRILL (MISCELLANEOUS) ×1 IMPLANT
COVER MAYO STAND STRL (DRAPES) ×2 IMPLANT
DECANTER SPIKE VIAL GLASS SM (MISCELLANEOUS) ×2 IMPLANT
DERMABOND ADVANCED (GAUZE/BANDAGES/DRESSINGS) ×1
DERMABOND ADVANCED .7 DNX12 (GAUZE/BANDAGES/DRESSINGS) ×1 IMPLANT
DIFFUSER DRILL AIR PNEUMATIC (MISCELLANEOUS) ×2 IMPLANT
DRAPE HALF SHEET 40X57 (DRAPES) ×1 IMPLANT
DRAPE LAPAROTOMY 100X72 PEDS (DRAPES) ×2 IMPLANT
DRAPE MICROSCOPE LEICA (MISCELLANEOUS) ×2 IMPLANT
DRAPE POUCH INSTRU U-SHP 10X18 (DRAPES) ×1 IMPLANT
DRILL NEURO 2X3.1 SOFT TOUCH (MISCELLANEOUS) ×2
ELECT COATED BLADE 2.86 ST (ELECTRODE) ×2 IMPLANT
ELECT REM PT RETURN 9FT ADLT (ELECTROSURGICAL) ×2
ELECTRODE REM PT RTRN 9FT ADLT (ELECTROSURGICAL) ×1 IMPLANT
GLOVE BIO SURGEON STRL SZ8 (GLOVE) ×1 IMPLANT
GLOVE BIO SURGEON STRL SZ8.5 (GLOVE) ×1 IMPLANT
GLOVE BIOGEL PI IND STRL 6.5 (GLOVE) IMPLANT
GLOVE BIOGEL PI IND STRL 7.0 (GLOVE) IMPLANT
GLOVE BIOGEL PI IND STRL 8 (GLOVE) ×1 IMPLANT
GLOVE BIOGEL PI INDICATOR 6.5 (GLOVE) ×2
GLOVE BIOGEL PI INDICATOR 7.0 (GLOVE) ×1
GLOVE BIOGEL PI INDICATOR 8 (GLOVE) ×1
GLOVE ECLIPSE 7.5 STRL STRAW (GLOVE) ×2 IMPLANT
GLOVE EXAM NITRILE LRG STRL (GLOVE) IMPLANT
GLOVE EXAM NITRILE XL STR (GLOVE) IMPLANT
GLOVE EXAM NITRILE XS STR PU (GLOVE) IMPLANT
GLOVE SS BIOGEL STRL SZ 6.5 (GLOVE) IMPLANT
GLOVE SUPERSENSE BIOGEL SZ 6.5 (GLOVE) ×3
GOWN STRL REUS W/ TWL LRG LVL3 (GOWN DISPOSABLE) IMPLANT
GOWN STRL REUS W/ TWL XL LVL3 (GOWN DISPOSABLE) ×1 IMPLANT
GOWN STRL REUS W/TWL 2XL LVL3 (GOWN DISPOSABLE) IMPLANT
GOWN STRL REUS W/TWL LRG LVL3 (GOWN DISPOSABLE) ×4
GOWN STRL REUS W/TWL XL LVL3 (GOWN DISPOSABLE) ×4
HALTER HD/CHIN CERV TRACTION D (MISCELLANEOUS) ×2 IMPLANT
HEMOSTAT POWDER KIT SURGIFOAM (HEMOSTASIS) ×2 IMPLANT
KIT BASIN OR (CUSTOM PROCEDURE TRAY) ×2 IMPLANT
KIT TURNOVER KIT B (KITS) ×2 IMPLANT
NDL HYPO 25X1 1.5 SAFETY (NEEDLE) ×1 IMPLANT
NDL SPNL 22GX3.5 QUINCKE BK (NEEDLE) ×1 IMPLANT
NEEDLE HYPO 25X1 1.5 SAFETY (NEEDLE) ×2 IMPLANT
NEEDLE SPNL 22GX3.5 QUINCKE BK (NEEDLE) ×4 IMPLANT
NS IRRIG 1000ML POUR BTL (IV SOLUTION) ×2 IMPLANT
OIL CARTRIDGE MAESTRO DRILL (MISCELLANEOUS) ×2
PACK LAMINECTOMY NEURO (CUSTOM PROCEDURE TRAY) ×2 IMPLANT
PAD ARMBOARD 7.5X6 YLW CONV (MISCELLANEOUS) ×6 IMPLANT
PLATE AVIATOR ASSY 1LVL SZ 12 (Plate) ×1 IMPLANT
RUBBERBAND STERILE (MISCELLANEOUS) ×4 IMPLANT
SCREW AVIATOR VAR SELFTAP 4X14 (Screw) ×4 IMPLANT
SPONGE INTESTINAL PEANUT (DISPOSABLE) ×2 IMPLANT
SPONGE SURGIFOAM ABS GEL SZ50 (HEMOSTASIS) ×2 IMPLANT
STAPLER SKIN PROX WIDE 3.9 (STAPLE) IMPLANT
SUT VIC AB 2-0 CP2 18 (SUTURE) ×2 IMPLANT
SUT VIC AB 3-0 SH 8-18 (SUTURE) ×4 IMPLANT
TOWEL GREEN STERILE (TOWEL DISPOSABLE) ×2 IMPLANT
TOWEL GREEN STERILE FF (TOWEL DISPOSABLE) ×1 IMPLANT
WATER STERILE IRR 1000ML POUR (IV SOLUTION) ×2 IMPLANT

## 2018-09-02 NOTE — Anesthesia Postprocedure Evaluation (Signed)
Anesthesia Post Note  Patient: KEYMARI SATO  Procedure(s) Performed: ANTERIOR CERVICAL DECOMPRESSION/DISCECTOMY FUSION CERVICAL FIVE- CERVICAL SIX (N/A Spine Cervical)     Patient location during evaluation: PACU Anesthesia Type: General Level of consciousness: awake and alert Pain management: pain level controlled Vital Signs Assessment: post-procedure vital signs reviewed and stable Respiratory status: spontaneous breathing, nonlabored ventilation, respiratory function stable and patient connected to nasal cannula oxygen Cardiovascular status: blood pressure returned to baseline and stable Postop Assessment: no apparent nausea or vomiting Anesthetic complications: no    Last Vitals:  Vitals:   09/02/18 1111 09/02/18 1606  BP: 133/81 140/72  Pulse: 74 90  Resp: (!) 21 20  Temp: 36.8 C 37 C  SpO2: 99% 98%    Last Pain:  Vitals:   09/02/18 1630  TempSrc:   PainSc: 2                  Emmamarie Kluender COKER

## 2018-09-02 NOTE — Transfer of Care (Signed)
Immediate Anesthesia Transfer of Care Note  Patient: [name]  Procedure(s) Performed: ANTERIOR CERVICAL DECOMPRESSION/DISCECTOMY FUSION CERVICAL FIVE- CERVICAL SIX (N/A Spine Cervical)  Patient Location: PACU  Anesthesia Type:General  Level of Consciousness: awake, alert  and oriented  Airway & Oxygen Therapy: Patient Spontanous Breathing  Post-op Assessment: Report given to RN and Post -op Vital signs reviewed and stable  Post vital signs: Reviewed and stable  Last Vitals:  Vitals Value Taken Time  BP 141/82 09/02/2018 10:25 AM  Temp 36.4 C 09/02/2018 10:25 AM  Pulse 81 09/02/2018 10:30 AM  Resp 15 09/02/2018 10:30 AM  SpO2 99 % 09/02/2018 10:30 AM  Vitals shown include unvalidated device data.  Last Pain:  Vitals:   09/02/18 0635  PainSc: 2       Patients Stated Pain Goal: 3 (94/70/76 1518)  Complications: No apparent anesthesia complications

## 2018-09-02 NOTE — Progress Notes (Signed)
Pt doing well. Pt and wife given D/C instructions with Rx, verbal understanding was provided.Pt's incision is clean and dry with no sign of infection. Pt's IV was removed prior to D/C. Pt D/C'd home via walking @ 1815 per MD order. Pt is stable @ D/C and has no other needs at this time. Holli Humbles, RN

## 2018-09-02 NOTE — Anesthesia Preprocedure Evaluation (Signed)
Anesthesia Evaluation  Patient identified by MRN, date of birth, ID band  Reviewed: Allergy & Precautions, NPO status , Patient's Chart, lab work & pertinent test results  Airway Mallampati: II  TM Distance: >3 FB Neck ROM: Full    Dental  (+) Teeth Intact, Dental Advisory Given   Pulmonary former smoker,    breath sounds clear to auscultation       Cardiovascular  Rhythm:Regular Rate:Normal     Neuro/Psych    GI/Hepatic   Endo/Other    Renal/GU      Musculoskeletal   Abdominal   Peds  Hematology   Anesthesia Other Findings   Reproductive/Obstetrics                             Anesthesia Physical Anesthesia Plan  ASA: III  Anesthesia Plan: General   Post-op Pain Management:    Induction: Intravenous  PONV Risk Score and Plan: 1 and Ondansetron and Dexamethasone  Airway Management Planned: Oral ETT  Additional Equipment:   Intra-op Plan:   Post-operative Plan: Extubation in OR  Informed Consent: I have reviewed the patients History and Physical, chart, labs and discussed the procedure including the risks, benefits and alternatives for the proposed anesthesia with the patient or authorized representative who has indicated his/her understanding and acceptance.   Dental advisory given  Plan Discussed with: CRNA and Anesthesiologist  Anesthesia Plan Comments:         Anesthesia Quick Evaluation

## 2018-09-02 NOTE — Op Note (Signed)
09/02/2018  10:05 AM  PATIENT:  [name]  52 y.o. 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, cervical spondylosis, cervical degenerative disease, right cervical radiculopathy  POST-OPERATIVE DIAGNOSIS:  C5-6 cervical disc herniation, cervical spondylosis, cervical degenerative disease, right cervical radiculopathy  PROCEDURE:  Procedure(s): C5-6 anterior cervical decompression and arthrodesis with structural allograft and aviator cervical plating  SURGEON: Jovita Gamma, MD  ASSISTANTS: Newman Pies, MD  ANESTHESIA:   general  EBL:  Total I/O In: -  Out: 20 [Blood:20]  BLOOD ADMINISTERED:none  COUNT:  Correct per nursing staff  DICTATION: Patient was brought to the operating room placed under general endotracheal anesthesia. Patient was placed in 10 pounds of halter traction. The neck was prepped with Betadine soap and solution and draped in a sterile fashion. A horizontal incision was made on the left side of the neck. The line of the incision was infiltrated with local anesthetic with epinephrine. Dissection was carried down thru the subcutaneous tissue and platysma, bipolar cautery was used to maintain hemostasis. Dissection was then carried down thru an avascular plane leaving the sternocleidomastoid carotid artery and jugular vein laterally and the trachea and esophagus medially. The ventral aspect of the vertebral column was identified and a localizing x-ray was taken. The C5-6 level was identified. The annulus was incised and the disc space entered.  Ventral osteophytic overgrowth was removed with Kerrison punches and a high-speed drill.  Discectomy was then continued with micro-curettes and pituitary rongeurs. The operating microscope was draped and brought into the field provided additional magnification illumination and visualization. Discectomy was continued posteriorly thru the disc space and then the cartilaginous endplate was removed using  micro-curettes along with the high-speed drill. Posterior osteophytic overgrowth was removed using the high-speed drill along with a 2 mm thin footplated Kerrison punch. Posterior longitudinal ligament along with disc herniation was carefully removed, decompressing the spinal canal and thecal sac.  A moderately large disc herniation laterally to the right with a fragment behind the ligament was identified and removed, again with good decompression of the spinal canal and thecal sac, as well as the exiting nerve root.  We then continued to remove osteophytic overgrowth and disc material decompressing the neural foramina and exiting nerve roots bilaterally. Once the decompression was completed hemostasis was established with the use of Gelfoam with thrombin and bipolar cautery. The Gelfoam was removed, a thin layer of Surgifoam applied, the wound irrigated and hemostasis confirmed. We then measured the height of the intravertebral disc space and selected a 7 millimeter in height structural allograft. It was hydrated and saline solution and then gently positioned in the intravertebral disc space and countersunk. We then selected a 12 millimeter in height Aviator cervical plate. It was positioned over the fusion construct and secured to the vertebra with 4 x 14 mm self tapping screws at the C5 and C6 levels. Each screw hole was started with the high-speed drill and then the screws placed once all the screws were placed, the locking system was secured. The wound was irrigated with bacitracin solution checked for hemostasis which was established and confirmed. An x-ray was taken which showed the graft in good position, the plate and screws in good position, and the overall alignment was good. We then proceeded with closure. The platysma was closed with interrupted inverted 2-0 undyed Vicryl suture, the subcutaneous and subcuticular closed with interrupted inverted 3-0 undyed Vicryl suture. The skin edges were approximated  with Dermabond. Following surgery the patient was taken out of  cervical traction. To be reversed and the anesthetic and taken to the recovery room for further care.   PLAN OF CARE: Admit for overnight observation  PATIENT DISPOSITION:  PACU - hemodynamically stable.   Delay start of Pharmacological VTE agent (>24hrs) due to surgical blood loss or risk of bleeding:  yes

## 2018-09-02 NOTE — Progress Notes (Signed)
Pt wife noticed redness on pt's hand after starting the vanc. CRNA turned vanc off and RN went to check IV site. Some new patchy redness was noted on pt's hand. Dr. Linna Caprice made aware.

## 2018-09-02 NOTE — Progress Notes (Signed)
Spoke with Dr. Sherwood Gambler, aware of rash on hand--wife states hand is now "less red." Vanc restarted, 25mg  IV Benedryl given.

## 2018-09-02 NOTE — Anesthesia Procedure Notes (Signed)
Procedure Name: Intubation Date/Time: 09/02/2018 8:02 AM Performed by: Marsa Aris, CRNA Pre-anesthesia Checklist: Patient identified, Emergency Drugs available, Suction available and Patient being monitored Patient Re-evaluated:Patient Re-evaluated prior to induction Oxygen Delivery Method: Circle System Utilized Preoxygenation: Pre-oxygenation with 100% oxygen Induction Type: IV induction Ventilation: Mask ventilation without difficulty Laryngoscope Size: Glidescope and 4 Grade View: Grade I Tube type: Oral Tube size: 7.5 mm Number of attempts: 1 Airway Equipment and Method: Oral airway,  Video-laryngoscopy and Bite block Placement Confirmation: ETT inserted through vocal cords under direct vision,  positive ETCO2 and breath sounds checked- equal and bilateral Secured at: 22 cm Tube secured with: Tape Dental Injury: Teeth and Oropharynx as per pre-operative assessment  Comments: Videoscope utilized, no change in dentition from pre-procedure, c-spine neutrality maintained.

## 2018-09-02 NOTE — Discharge Summary (Signed)
Physician Discharge Summary  Patient ID: [name] MRN: [id number] DOB/AGE: 03/18/1966 52 y.o.  Admit date: 09/02/2018 Discharge date: 09/02/2018  Admission Diagnoses:  C5-6 cervical disc herniation, cervical spondylosis, cervical degenerative disease, right cervical radiculopathy  Discharge Diagnoses:  C5-6 cervical disc herniation, cervical spondylosis, cervical degenerative disease, right cervical radiculopathy Active Problems:   HNP (herniated nucleus pulposus), cervical   Discharged Condition: good  Hospital Course: Patient was admitted, underwent a single level C5-6 ACDF with structural allograft and cervical plating.  He has done well.  He is comfortable.  He is up and ambulating actively.  He is voiding.  His incision is clean and dry; there is no erythema, ecchymosis, swelling, or drainage.  We are discharging him to home with instructions regarding wound care and activities given to both he and his wife.  He is scheduled for follow-up with me in the office on October 23.  Discharge Exam: Blood pressure 140/72, pulse 90, temperature 98.6 F (37 C), temperature source Oral, resp. rate 20, height 5' 11.5" (1.816 m), weight 86.3 kg, SpO2 98 %.  Disposition: Discharge disposition: 01-Home or Self Care       Discharge Instructions    Discharge wound care:   Complete by:  As directed    Leave the wound open to air. Shower daily with the wound uncovered. Water and soapy water should run over the incision area. Do not wash directly on the incision for 2 weeks. Remove the glue after 2 weeks.   Driving Restrictions   Complete by:  As directed    No driving for 2 weeks. May ride in the car locally now. May begin to drive locally in 2 weeks.   Other Restrictions   Complete by:  As directed    Walk gradually increasing distances out in the fresh air at least twice a day. Walking additional 6 times inside the house, gradually increasing distances, daily. No bending, lifting, or  twisting. Perform activities between shoulder and waist height (that is at counter height when standing or table height when sitting).     Allergies as of 09/02/2018      Reactions   Tramadol Other (See Comments)   GI Upset   Ampicillin Swelling   Has patient had a PCN reaction causing immediate rash, facial/tongue/throat swelling, SOB or lightheadedness with hypotension: Unknown Has patient had a PCN reaction causing severe rash involving mucus membranes or skin necrosis: No Has patient had a PCN reaction that required hospitalization: No Has patient had a PCN reaction occurring within the last 10 years: No If all of the above answers are "NO", then may proceed with Cephalosporin use.   Hydrocodone-acetaminophen    Morphine Swelling   Oxcarbazepine Rash      Medication List    STOP taking these medications   Oxcarbazepine 300 MG tablet Commonly known as:  TRILEPTAL     TAKE these medications   acetaminophen 500 MG tablet Commonly known as:  TYLENOL Take 500 mg by mouth every 6 (six) hours as needed for moderate pain.   bethanechol 50 MG tablet Commonly known as:  URECHOLINE Take 50 mg by mouth 4 (four) times daily.   Biotin 5000 MCG Caps Take 5,000 mcg by mouth daily.   COMMIT 4 MG lozenge Generic drug:  nicotine polacrilex Place 2 mg inside cheek as needed for smoking cessation.   diphenhydramine-acetaminophen 25-500 MG Tabs tablet Commonly known as:  TYLENOL PM Take 1 tablet by mouth at bedtime as needed (sleep).  donepezil 10 MG tablet Commonly known as:  ARICEPT Take 1 tablet (10 mg total) by mouth daily.   finasteride 5 MG tablet Commonly known as:  PROSCAR Take 5 mg by mouth daily.   fluticasone 50 MCG/ACT nasal spray Commonly known as:  FLONASE USE 2 SPRAYS IN EACH NOSTRIL DAILY AS NEEDED FOR ALLERGIES. GENERIC EQUIVALENT FOR FLONASE What changed:  Another medication with the same name was changed. Make sure you understand how and when to take each.    fluticasone 50 MCG/ACT nasal spray Commonly known as:  FLONASE USE 2 SPRAYS IN EACH NOSTRIL DAILY AS NEEDED FOR ALLERGIES. GENERIC EQUIVALENT FOR FLONASE What changed:  See the new instructions.   FOCUSED MIND PO Take 1 tablet by mouth daily.   gabapentin 800 MG tablet Commonly known as:  NEURONTIN TAKE 1 TABLET BY MOUTH THREE TIMES DAILY   ibuprofen 800 MG tablet Commonly known as:  ADVIL,MOTRIN Take 800 mg by mouth 3 (three) times daily.   LORazepam 2 MG tablet Commonly known as:  ATIVAN TAKE 1 BY MOUTH TWICE DAILY AS NEEDED FOR ANXIETY What changed:    how much to take  how to take this  when to take this  reasons to take this   METANX 3-90.314-2-35 MG Caps Take 2 capsules by mouth daily.   multivitamin with minerals Tabs tablet Take 1 tablet by mouth daily.   omeprazole 20 MG capsule Commonly known as:  PRILOSEC Take 1 capsule (20 mg total) by mouth daily.   OVER THE COUNTER MEDICATION Take 1 tablet by mouth daily. [employer] MEN AND GNC CALCIUM/MAGNESIUM   OVER THE COUNTER MEDICATION Take 1 tablet by mouth daily. [employer] Mega Man   OVER THE COUNTER MEDICATION Take 1 tablet by mouth daily. [employer] Triflex   oxyCODONE 5 MG immediate release tablet Commonly known as:  Oxy IR/ROXICODONE Take 1-2 tablets (5-10 mg total) by mouth every 4 (four) hours as needed (pain).   sildenafil 100 MG tablet Commonly known as:  VIAGRA sildenafil 100 mg tablet  TK 1 T PO  PRF UP TO 30 DAYS FOR ERECTILE DYSFUNCTION. GEF VIAGRA   TECFIDERA 240 MG Cpdr Generic drug:  Dimethyl Fumarate Take 240 mg by mouth 2 (two) times daily.   traZODone 50 MG tablet Commonly known as:  DESYREL TAKE 2 BY MOUTH AT BEDTIME AS NEEDED FOR SLEEP What changed:    how much to take  how to take this  when to take this            Discharge Care Instructions  (From admission, onward)         Start     Ordered   09/02/18 0000  Discharge wound care:    Comments:  Leave the wound open to air.  Shower daily with the wound uncovered. Water and soapy water should run over the incision area. Do not wash directly on the incision for 2 weeks. Remove the glue after 2 weeks.   09/02/18 1650           Signed: Hosie Spangle 09/02/2018, 4:50 PM

## 2018-09-02 NOTE — H&P (Signed)
Subjective: Patient is a 52 y.o. left-handed male who is admitted for treatment of right C5-6 cervical disc herniation with spinal cord and nerve root compression with resulting right cervical radiculopathy, with significant weakness through the right upper extremity.  Symptoms began following a fall from a stepladder in January of this year.  He suffered injuries to the right shoulder and scapula which were managed by Dr. Amedeo Plenty.  However he has had continued numbness and paresthesias from the right shoulder running down to the right arm forearm and into the right thumb as well as around the right scapula and into the neck.  He described a sense of weakness in the right upper extremity, and on exam was found to have significant weakness as described below.  Work-up included x-rays and MRI scan.  These show significant multilevel cervical spondylosis and degenerative disc disease, but was significantly enlarged right C5-6 cervical disc herniation.  He is admitted now for a C5-6 anterior cervical decompression and arthrodesis with structural allograft and cervical plating.   Patient Active Problem List   Diagnosis Date Noted  . Hx of colonic polyps 08/12/2017  . MS (multiple sclerosis) ([place]) 06/03/2013  . Follow-up examination, following unspecified surgery 02/08/2013  . Hemothorax on right 02/08/2013  . Pleural effusion on right 12/31/2012  . Fracture, ribs 12/31/2012  . NICOTINE ADDICTION 05/24/2010  . Dyslipidemia 03/27/2010  . MULTIPLE SCLEROSIS, RELAPSING/REMITTING 03/27/2010  . Allergic rhinitis 03/27/2010  . GERD 03/27/2010   Past Medical History:  Diagnosis Date  . ALLERGIC RHINITIS 03/27/2010  . Fracture, ribs 12/31/2012   Non-displaced fracture of right 8th-11th ribs  . Gait disorder   . Gastroparesis   . GERD 03/27/2010  . Hx of adenomatous polyp of colon 08/12/2017  . HYPERLIPIDEMIA 03/27/2010  . Insomnia   . MULTIPLE SCLEROSIS, RELAPSING/REMITTING 03/27/2010  . Neurogenic bladder    . NICOTINE ADDICTION 05/24/2010  . Peripheral neuropathy   . Pleural effusion on right 12/31/2012   Secondary to rib fractures  . Rib fracture 11/2012  . TMJ (dislocation of temporomandibular joint)     Past Surgical History:  Procedure Laterality Date  . ANKLE SURGERY    . FOOT SURGERY    . INCISION AND DRAINAGE OF WOUND  2015  . PLEURAL EFFUSION DRAINAGE  01/01/2013   Procedure: DRAINAGE OF PLEURAL EFFUSION;  Surgeon: Rexene Alberts, MD;  Location: [facility];  Service: Thoracic;  Laterality: Right;    Facility-Administered Medications Prior to Admission  Medication Dose Route Frequency Provider Last Rate Last Dose  . 0.9 %  sodium chloride infusion  500 mL Intravenous Continuous Gatha Mayer, MD       Medications Prior to Admission  Medication Sig Dispense Refill Last Dose  . acetaminophen (TYLENOL) 500 MG tablet Take 500 mg by mouth every 6 (six) hours as needed for moderate pain.   09/02/2018 at Unknown time  . bethanechol (URECHOLINE) 50 MG tablet Take 50 mg by mouth 4 (four) times daily.    09/01/2018 at Unknown time  . Biotin 5000 MCG CAPS Take 5,000 mcg by mouth daily.    09/01/2018 at Unknown time  . diphenhydramine-acetaminophen (TYLENOL PM) 25-500 MG TABS Take 1 tablet by mouth at bedtime as needed (sleep).    09/01/2018 at Unknown time  . donepezil (ARICEPT) 10 MG tablet Take 1 tablet (10 mg total) by mouth daily. 90 tablet 2 09/01/2018 at Unknown time  . finasteride (PROSCAR) 5 MG tablet Take 5 mg by mouth daily.   [place]  09/01/2018 at Unknown time  . fluticasone (FLONASE) 50 MCG/ACT nasal spray USE 2 SPRAYS IN EACH NOSTRIL DAILY AS NEEDED FOR ALLERGIES. GENERIC EQUIVALENT FOR FLONASE (Patient taking differently: Place 2 sprays into both nostrils daily. USE 2 SPRAYS IN EACH NOSTRIL DAILY AS NEEDED FOR ALLERGIES. GENERIC EQUIVALENT FOR FLONASE) 16 g 2 09/02/2018 at Unknown time  . gabapentin (NEURONTIN) 800 MG tablet TAKE 1 TABLET BY MOUTH THREE TIMES DAILY (Patient taking differently:  Take 800 mg by mouth 3 (three) times daily. ) 270 tablet 0 09/02/2018 at Unknown time  . ibuprofen (ADVIL,MOTRIN) 800 MG tablet Take 800 mg by mouth 3 (three) times daily.   Past Week at Unknown time  . L-Methylfolate-Algae-B12-B6 (METANX) 3-90.314-2-35 MG CAPS Take 2 capsules by mouth daily. 180 capsule 4 09/01/2018 at Unknown time  . LORazepam (ATIVAN) 2 MG tablet TAKE 1 BY MOUTH TWICE DAILY AS NEEDED FOR ANXIETY (Patient taking differently: Take 1 mg by mouth every 8 (eight) hours as needed for anxiety. TAKE 1 BY MOUTH TWICE DAILY AS NEEDED FOR ANXIETY) 60 tablet 0 09/01/2018 at Unknown time  . Misc Natural Products (FOCUSED MIND PO) Take 1 tablet by mouth daily.   09/01/2018 at Unknown time  . Multiple Vitamin (MULTIVITAMIN WITH MINERALS) TABS tablet Take 1 tablet by mouth daily.   09/01/2018 at Unknown time  . nicotine polacrilex (COMMIT) 4 MG lozenge Place 2 mg inside cheek as needed for smoking cessation.    09/01/2018 at Unknown time  . omeprazole (PRILOSEC) 20 MG capsule Take 1 capsule (20 mg total) by mouth daily. 90 capsule 2 09/01/2018 at Unknown time  . OVER THE COUNTER MEDICATION Take 1 tablet by mouth daily. [place] MEN AND GNC CALCIUM/MAGNESIUM    09/01/2018 at Unknown time  . OVER THE COUNTER MEDICATION Take 1 tablet by mouth daily. [place] Mega Man   09/01/2018 at Unknown time  . OVER THE COUNTER MEDICATION Take 1 tablet by mouth daily. [place] Triflex   09/01/2018 at Unknown time  . TECFIDERA 240 MG CPDR Take 240 mg by mouth 2 (two) times daily.   4 09/02/2018 at Unknown time  . traZODone (DESYREL) 50 MG tablet TAKE 2 BY MOUTH AT BEDTIME AS NEEDED FOR SLEEP (Patient taking differently: Take 100 mg by mouth at bedtime. TAKE 2 BY MOUTH AT BEDTIME AS NEEDED FOR SLEEP) 60 tablet 2 09/01/2018 at Unknown time  . fluticasone (FLONASE) 50 MCG/ACT nasal spray USE 2 SPRAYS IN EACH NOSTRIL DAILY AS NEEDED FOR ALLERGIES. GENERIC EQUIVALENT FOR FLONASE (Patient not taking: Reported on 08/28/2018) 16 g 0 Not Taking at  Unknown time  . Oxcarbazepine (TRILEPTAL) 300 MG tablet Take 300 mg by mouth 2 (two) times daily.    Taking  . sildenafil (VIAGRA) 100 MG tablet sildenafil 100 mg tablet  TK 1 T PO  PRF UP TO 30 DAYS FOR ERECTILE DYSFUNCTION. GEF VIAGRA   Taking   Allergies  Allergen Reactions  . Tramadol Other (See Comments)    GI Upset  . Ampicillin Swelling    Has patient had a PCN reaction causing immediate rash, facial/tongue/throat swelling, SOB or lightheadedness with hypotension: Unknown Has patient had a PCN reaction causing severe rash involving mucus membranes or skin necrosis: No Has patient had a PCN reaction that required hospitalization: No Has patient had a PCN reaction occurring within the last 10 years: No If all of the above answers are "NO", then may proceed with Cephalosporin use.   Marland Kitchen Hydrocodone-Acetaminophen   . Morphine Swelling  .  Oxcarbazepine Rash    Social History   Tobacco Use  . Smoking status: Former Smoker    Packs/day: 0.50    Years: 20.00    Pack years: 10.00    Types: Cigarettes    Last attempt to quit: 10/16/2012    Years since quitting: 5.8  . Smokeless tobacco: Former Systems developer    Quit date: 10/31/2012  Substance Use Topics  . Alcohol use: Yes    Comment: occasionally    Family History  Problem Relation Age of Onset  . Cancer Father        lung ca  . Heart failure Mother   . Colon cancer Neg Hx   . Stomach cancer Neg Hx   . Rectal cancer Neg Hx   . Esophageal cancer Neg Hx      Review of Systems Pertinent items noted in HPI and remainder of comprehensive ROS otherwise negative.  Objective: Vital signs in last 24 hours: Temp:  [98 F (36.7 C)-98.1 F (36.7 C)] 98 F (36.7 C) (09/25 0601) Pulse Rate:  [69-81] 69 (09/25 0601) Resp:  [20] 20 (09/25 0601) BP: (144-145)/(86-92) 145/92 (09/25 0601) SpO2:  [96 %-98 %] 96 % (09/25 0601) Weight:  [86.3 kg] 86.3 kg (09/25 0624)  EXAM: This is a well-developed well-nourished white male in no acute  distress.   Lungs are clear to auscultation , the patient has symmetrical respiratory excursion. Heart has a regular rate and rhythm normal S1 and S2 no murmur.   Abdomen is soft nontender nondistended bowel sounds are present. Extremity examination shows no clubbing cyanosis or edema. Neurologic examination shows patient is awake and alert, fully oriented.  He has facial asymmetry with mild flattening of the right lower part of the face, including slight drooping of the right nares, flattening of the right nasolabial fold, as well as the right corner of the mouth.  Motor examination shows 5/5 strength in the left upper extremity, including the deltoid, biceps, triceps, wrist extensor, intrinsics, and grip.  However in the right upper extremity, the deltoid and biceps are 4, the triceps is 5, wrist extensor and grip are 4, and the intrinsics are 5.  Sensation is decreased to pinprick in the right first digit.  Otherwise sensation is intact to pinprick to the remainder of the digits of the upper extremities.  Reflex examination shows the left biceps is minimal, right biceps is trace, brachioradialis are minimal bilaterally.  Left triceps is absent, right triceps is 2-3.  Left quadriceps and gastric limits are minimal.  Right quad sets and gassiness are trace.  Toes are downgoing belly.  He has a normal gait and stance.  Data Review:CBC    Component Value Date/Time   WBC 8.9 09/01/2018 1437   RBC 4.95 09/01/2018 1437   HGB 15.3 09/01/2018 1437   HCT 47.1 09/01/2018 1437   PLT 224 09/01/2018 1437   MCV 95.2 09/01/2018 1437   MCH 30.9 09/01/2018 1437   MCHC 32.5 09/01/2018 1437   RDW 12.8 09/01/2018 1437   RDW 13.3 07/12/2014 1613   LYMPHSABS 0.5 (L) 06/02/2018 1432   LYMPHSABS 1.7 07/12/2014 1613   MONOABS 0.5 06/02/2018 1432   EOSABS 0.1 06/02/2018 1432   EOSABS 0.1 07/12/2014 1613   BASOSABS 0.0 06/02/2018 1432   BASOSABS 0.0 07/12/2014 1613                          BMET    Component Value  Date/Time  NA 140 09/01/2018 1437   NA 142 07/12/2014 1613   K 3.6 09/01/2018 1437   CL 102 09/01/2018 1437   CO2 25 09/01/2018 1437   GLUCOSE 106 (H) 09/01/2018 1437   BUN 14 09/01/2018 1437   BUN 21 07/12/2014 1613   CREATININE 0.96 09/01/2018 1437   CREATININE 1.33 12/14/2012 2023   CALCIUM 9.7 09/01/2018 1437   GFRNONAA >60 09/01/2018 1437   GFRAA >60 09/01/2018 1437     Assessment/Plan: Patient with persistent right cervical radiculopathy with weakness, numbness, and paresthesias with multilevel cervical spondylosis and degenerative disc disease, but with a large right C5-6 cervical disc herniation, who is admitted now for single level C5-6 ACDF.  I've discussed with the patient the nature of his condition, the nature the surgical procedure, the typical length of surgery, hospital stay, and overall recuperation. We discussed limitations postoperatively. I discussed risks of surgery including risks of infection, bleeding, possibly need for transfusion, the risk of nerve root dysfunction with pain, weakness, numbness, or paresthesias, the risk of spinal cord dysfunction with paralysis of all 4 limbs and quadriplegia, and the risk of dural tear and CSF leakage and possible need for further surgery, the risk of esophageal dysfunction causing dysphagia and the risk of laryngeal dysfunction causing hoarseness of the voice, the risk of failure of the arthrodesis and the possible need for further surgery, and the risk of anesthetic complications including myocardial infarction, stroke, pneumonia, and death. We also discussed the need for postoperative immobilization in a cervical collar. Understanding all this the patient does wish to proceed with surgery and is admitted for such.    Hosie Spangle, MD 09/02/2018 7:27 AM

## 2018-09-03 ENCOUNTER — Encounter (HOSPITAL_COMMUNITY): Payer: Self-pay | Admitting: Neurosurgery

## 2018-09-30 DIAGNOSIS — M5020 Other cervical disc displacement, unspecified cervical region: Secondary | ICD-10-CM | POA: Diagnosis not present

## 2018-10-06 DIAGNOSIS — M25552 Pain in left hip: Secondary | ICD-10-CM | POA: Diagnosis not present

## 2018-10-08 DIAGNOSIS — G35 Multiple sclerosis: Secondary | ICD-10-CM | POA: Diagnosis not present

## 2018-10-08 DIAGNOSIS — N319 Neuromuscular dysfunction of bladder, unspecified: Secondary | ICD-10-CM | POA: Diagnosis not present

## 2018-10-08 DIAGNOSIS — N401 Enlarged prostate with lower urinary tract symptoms: Secondary | ICD-10-CM | POA: Diagnosis not present

## 2018-10-19 ENCOUNTER — Other Ambulatory Visit: Payer: Self-pay | Admitting: Internal Medicine

## 2018-10-19 DIAGNOSIS — M25552 Pain in left hip: Secondary | ICD-10-CM | POA: Diagnosis not present

## 2018-10-19 NOTE — Telephone Encounter (Signed)
Left message to return phone call.

## 2018-10-20 NOTE — Telephone Encounter (Signed)
Pt aware via vm  to follow up with new PCP for further refills due to Dr.Kwiakowski's retiring. Rx refilled for 3 months. Any other refills will need to come from pt new provider.

## 2018-10-23 DIAGNOSIS — M25552 Pain in left hip: Secondary | ICD-10-CM | POA: Diagnosis not present

## 2018-10-23 DIAGNOSIS — M545 Low back pain: Secondary | ICD-10-CM | POA: Diagnosis not present

## 2018-11-12 DIAGNOSIS — M549 Dorsalgia, unspecified: Secondary | ICD-10-CM | POA: Diagnosis not present

## 2018-11-12 DIAGNOSIS — M545 Low back pain: Secondary | ICD-10-CM | POA: Diagnosis not present

## 2018-11-12 DIAGNOSIS — S3992XA Unspecified injury of lower back, initial encounter: Secondary | ICD-10-CM | POA: Diagnosis not present

## 2018-11-13 ENCOUNTER — Other Ambulatory Visit: Payer: Self-pay | Admitting: Neurosurgery

## 2018-11-13 DIAGNOSIS — M5136 Other intervertebral disc degeneration, lumbar region: Secondary | ICD-10-CM | POA: Diagnosis not present

## 2018-11-13 DIAGNOSIS — M47816 Spondylosis without myelopathy or radiculopathy, lumbar region: Secondary | ICD-10-CM | POA: Diagnosis not present

## 2018-11-13 DIAGNOSIS — M549 Dorsalgia, unspecified: Secondary | ICD-10-CM | POA: Diagnosis not present

## 2018-11-13 DIAGNOSIS — M546 Pain in thoracic spine: Secondary | ICD-10-CM | POA: Diagnosis not present

## 2018-11-13 DIAGNOSIS — M5126 Other intervertebral disc displacement, lumbar region: Secondary | ICD-10-CM | POA: Diagnosis not present

## 2018-11-23 ENCOUNTER — Encounter (HOSPITAL_COMMUNITY)
Admission: RE | Admit: 2018-11-23 | Discharge: 2018-11-23 | Disposition: A | Discharge status: Home / Self Care | Payer: 59 | Source: Ambulatory Visit | Attending: Neurosurgery | Admitting: Neurosurgery

## 2018-11-23 ENCOUNTER — Other Ambulatory Visit: Payer: Self-pay

## 2018-11-23 ENCOUNTER — Encounter (HOSPITAL_COMMUNITY): Payer: Self-pay

## 2018-11-23 DIAGNOSIS — Z885 Allergy status to narcotic agent status: Secondary | ICD-10-CM | POA: Diagnosis not present

## 2018-11-23 DIAGNOSIS — M5126 Other intervertebral disc displacement, lumbar region: Secondary | ICD-10-CM | POA: Insufficient documentation

## 2018-11-23 DIAGNOSIS — Z87891 Personal history of nicotine dependence: Secondary | ICD-10-CM | POA: Diagnosis not present

## 2018-11-23 DIAGNOSIS — Z881 Allergy status to other antibiotic agents status: Secondary | ICD-10-CM | POA: Diagnosis not present

## 2018-11-23 DIAGNOSIS — Z01818 Encounter for other preprocedural examination: Secondary | ICD-10-CM

## 2018-11-23 DIAGNOSIS — Z888 Allergy status to other drugs, medicaments and biological substances status: Secondary | ICD-10-CM | POA: Diagnosis not present

## 2018-11-23 DIAGNOSIS — M5116 Intervertebral disc disorders with radiculopathy, lumbar region: Secondary | ICD-10-CM | POA: Diagnosis not present

## 2018-11-23 DIAGNOSIS — G4700 Insomnia, unspecified: Secondary | ICD-10-CM | POA: Diagnosis not present

## 2018-11-23 DIAGNOSIS — G35 Multiple sclerosis: Secondary | ICD-10-CM | POA: Diagnosis not present

## 2018-11-23 DIAGNOSIS — K3184 Gastroparesis: Secondary | ICD-10-CM | POA: Diagnosis not present

## 2018-11-23 DIAGNOSIS — Z981 Arthrodesis status: Secondary | ICD-10-CM | POA: Diagnosis not present

## 2018-11-23 DIAGNOSIS — G629 Polyneuropathy, unspecified: Secondary | ICD-10-CM | POA: Diagnosis not present

## 2018-11-23 DIAGNOSIS — Z79899 Other long term (current) drug therapy: Secondary | ICD-10-CM | POA: Diagnosis not present

## 2018-11-23 HISTORY — DX: Other specified postprocedural states: Z98.890

## 2018-11-23 HISTORY — DX: Nausea with vomiting, unspecified: R11.2

## 2018-11-23 LAB — BASIC METABOLIC PANEL
ANION GAP: 9 (ref 5–15)
BUN: 20 mg/dL (ref 6–20)
CO2: 27 mmol/L (ref 22–32)
Calcium: 9.7 mg/dL (ref 8.9–10.3)
Chloride: 104 mmol/L (ref 98–111)
Creatinine, Ser: 1.18 mg/dL (ref 0.61–1.24)
GFR calc Af Amer: >60 mL/min (ref >60)
GFR calc non Af Amer: >60 mL/min (ref >60)
Glucose, Bld: 103 mg/dL — ABNORMAL HIGH (ref 70–99)
POTASSIUM: 4 mmol/L (ref 3.5–5.1)
Sodium: 140 mmol/L (ref 135–145)

## 2018-11-23 LAB — CBC
HCT: 48.2 % (ref 39.0–52.0)
Hemoglobin: 15.6 g/dL (ref 13.0–17.0)
MCH: 31.1 pg (ref 26.0–34.0)
MCHC: 32.4 g/dL (ref 30.0–36.0)
MCV: 96.2 fL (ref 80.0–100.0)
Platelets: 201 10*3/uL (ref 150–400)
RBC: 5.01 MIL/uL (ref 4.22–5.81)
RDW: 13.1 % (ref 11.5–15.5)
WBC: 6.3 10*3/uL (ref 4.0–10.5)
nRBC: 0 % (ref 0.0–0.2)

## 2018-11-23 LAB — SURGICAL PCR SCREEN
MRSA, PCR: NEGATIVE
Staphylococcus aureus: NEGATIVE

## 2018-11-23 NOTE — Progress Notes (Signed)
PCP - Bluford Kaufmann Cardiologist - denies  Chest x-ray - not needed EKG -  Not needed Stress Test - denies ECHO - denies Cardiac Cath - denies  Anesthesia review: allison note 09/01/18  Patient denies shortness of breath, fever, cough and chest pain at PAT appointment   Patient verbalized understanding of instructions that were given to them at the PAT appointment. Patient was also instructed that they will need to review over the PAT instructions again at home before surgery.

## 2018-11-23 NOTE — Pre-Procedure Instructions (Signed)
JUSITN SALSGIVER  11/23/2018      [id number] [address], [place] [address] [place] [address] Phone: [phone] Fax: [phone]    Your procedure is scheduled on December 18  Report to [facility] [place].  Call this number if you have problems the morning of surgery:  [phone]   Remember:  Do not eat or drink after midnight.      Take these medicines the morning of surgery with A SIP OF WATER  acetaminophen (TYLENOL)  If needed bethanechol (URECHOLINE cyclobenzaprine (FLEXERIL) if needed donepezil (ARICEPT)  finasteride (PROSCAR) fluticasone (FLONASE) if needed gabapentin (NEURONTIN)  omeprazole (PRILOSEC)   7 days prior to surgery STOP taking any Aspirin(unless otherwise instructed by your surgeon), Aleve, Naproxen, Ibuprofen, Motrin, Advil, Goody's, BC's, all herbal medications, fish oil, and all vitamins  Stop nicotine polacrilex (COMMIT) at least 24 hours prior to srugery    Do not wear jewelry  Do not wear lotions, powders, or cologne, or deodorant.   Men may shave face and neck.  Do not bring valuables to the hospital.  [facility] is not responsible for any belongings or valuables.  Contacts, dentures or bridgework may not be worn into surgery.  Leave your suitcase in the car.  After surgery it may be brought to your room.  For patients admitted to the hospital, discharge time will be determined by your treatment team.  Patients discharged the day of surgery will not be allowed to drive home.    Special instructions:   [place]- Preparing For Surgery  Before surgery, you can play an important role. Because skin is not sterile, your skin needs to be as free of germs as possible. You can reduce the number of germs on your skin by washing with CHG (chlorahexidine gluconate) Soap before surgery.  CHG is an antiseptic cleaner which kills germs and bonds with the skin to continue killing  germs even after washing.    Oral Hygiene is also important to reduce your risk of infection.  Remember - BRUSH YOUR TEETH THE MORNING OF SURGERY WITH YOUR REGULAR TOOTHPASTE  Please do not use if you have an allergy to CHG or antibacterial soaps. If your skin becomes reddened/irritated stop using the CHG.  Do not shave (including legs and underarms) for at least 48 hours prior to first CHG shower. It is OK to shave your face.  Please follow these instructions carefully.   1. Shower the NIGHT BEFORE SURGERY and the MORNING OF SURGERY with CHG.   2. If you chose to wash your hair, wash your hair first as usual with your normal shampoo.  3. After you shampoo, rinse your hair and body thoroughly to remove the shampoo.  4. Use CHG as you would any other liquid soap. You can apply CHG directly to the skin and wash gently with a scrungie or a clean washcloth.   5. Apply the CHG Soap to your body ONLY FROM THE NECK DOWN.  Do not use on open wounds or open sores. Avoid contact with your eyes, ears, mouth and genitals (private parts). Wash Face and genitals (private parts)  with your normal soap.  6. Wash thoroughly, paying special attention to the area where your surgery will be performed.  7. Thoroughly rinse your body with warm water from the neck down.  8. DO NOT shower/wash with your normal soap after using and rinsing off the CHG Soap.  9. Pat yourself dry with a CLEAN TOWEL.  10. Wear CLEAN PAJAMAS to bed the night before surgery, wear comfortable clothes the morning of surgery  11. Place CLEAN SHEETS on your bed the night of your first shower and DO NOT SLEEP WITH PETS.    Day of Surgery:  Do not apply any deodorants/lotions.  Please wear clean clothes to the hospital/surgery center.   Remember to brush your teeth WITH YOUR REGULAR TOOTHPASTE.    Please read over the following fact sheets that you were given.

## 2018-11-24 ENCOUNTER — Other Ambulatory Visit: Payer: Self-pay | Admitting: Neurosurgery

## 2018-11-24 NOTE — Progress Notes (Signed)
Anesthesia Chart Review:  Case:  [id number] Date/Time:  11/25/18 0815   Procedure:  LEFT LUMBAR 3- LUMBAR 4 EXTRAFORAMINAL MICRODISCECTOMY (Left ) - LEFT LUMBAR 3- LUMBAR 4 EXTRAFORAMINAL MICRODISCECTOMY   Anesthesia type:  General   Pre-op diagnosis:  HERNIATED NUCLEUS PULPOSUS, LUMBAR   Location:  [facility] ROOM 20 / [facility]   Surgeon:  Jovita Gamma, MD      DISCUSSION: Patient is a 52 year old male scheduled for the above procedure. He is s/p C5-6 ACDF on 09/02/18. He requests the same anesthesia team if possible.   History includes former smoker (quit '13), Multiple Sclerosis (relapsing/remitting, diagnosed ~ '04; manifestations include short term memory loss/cognitive impairment, insomnia, sexual dysfunction, bowel difficulties, gait disorder), neurogenic bladder (does not have to self-cath), HLD, GERD, fall with right 8-11 rib fractures s/p right pleural effusion (s/p right VATS, drainage 01/01/13), right frontal meningioma (tiny by 08/18/18 MRI), right scapular fracture (12/2017 fall; followed by Dr. Amedeo Plenty), gastroparesis, C5-6 ACDF 09/03/15. He also reports some issues with dysphagia and is frustrated with his short-term memory loss, so he carries around a notepad.  I met with him prior to his c-spine surgery in September due to several anesthesia concerns. Primarily his concern then was in regards to post-operative aspiration pneumonia with prolonged hospitalizaton after ankle surgery ~ 2008/2009 at [facility]. He had been NPO for at least 12 hours prior to that surgery. A anesthesiologist friend had inquired about giving Reglan due to his aspiration pneumonia and gastroparesis historoy. It looks like he received ondansetron and dexamethasone for his 09/02/18 surgery. (See his anesthesia record and my previous note for more details.) His wife is Donald Prose, known professionally as Rosemary Holms, DPM.   He will be further assessed by his anesthesia team on the day of surgery, but if no  acute changes then I anticipate that he can proceed as planned.   VS: BP 115/66   Pulse 99   Temp 36.9 C   Resp 20   Ht 5' 11.75" (1.822 m)   Wt 86.2 kg   SpO2 100%   BMI 25.95 kg/m    PROVIDERS: Marletta Lor, MD is PCP. Last visit 06/02/18.  Diamantina Monks, MD is neurologist (see [place]). Last visit 07/16/18.  Alona Bene, MD is urologist (see [place]). Last visit 10/08/18.   LABS: Labs reviewed: Acceptable for surgery. (all labs ordered are listed, but only abnormal results are displayed)  Labs Reviewed  BASIC METABOLIC PANEL - Abnormal; Notable for the following components:      Result Value   Glucose, Bld 103 (*)    All other components within normal limits  SURGICAL PCR SCREEN  CBC    IMAGES: MRI head 08/18/18 ([place]): IMPRESSION: 1.Multiple T2-weighted lesions scattered throughout both cerebral hemispheres, LEFT thalamus and LEFT cerebellum with atrophy of the corpus callosum. This is unchanged and consistent the patient's known history of a demyelinating process. No new  lesions appreciated. 2.Tiny RIGHT frontal lobe meningioma.   EKG: N/A   CV: N/A   Past Medical History:  Diagnosis Date  . ALLERGIC RHINITIS 03/27/2010  . Fracture, ribs 12/31/2012   Non-displaced fracture of right 8th-11th ribs  . Gait disorder   . Gastroparesis   . GERD 03/27/2010  . Hx of adenomatous polyp of colon 08/12/2017  . HYPERLIPIDEMIA 03/27/2010  . Insomnia   . MULTIPLE SCLEROSIS, RELAPSING/REMITTING 03/27/2010  . Neurogenic bladder   . NICOTINE ADDICTION 05/24/2010  . Peripheral  neuropathy   . Pleural effusion on right 12/31/2012   Secondary to rib fractures  . PONV (postoperative nausea and vomiting)   . Rib fracture 11/2012  . TMJ (dislocation of temporomandibular joint)     Past Surgical History:  Procedure Laterality Date  . ANKLE SURGERY    . ANTERIOR CERVICAL DECOMP/DISCECTOMY FUSION N/A 09/02/2018    Procedure: ANTERIOR CERVICAL DECOMPRESSION/DISCECTOMY FUSION CERVICAL FIVE- CERVICAL SIX;  Surgeon: Jovita Gamma, MD;  Location: [facility];  Service: Neurosurgery;  Laterality: N/A;  . COLONOSCOPY    . FOOT SURGERY    . INCISION AND DRAINAGE OF WOUND  2015  . PLEURAL EFFUSION DRAINAGE  01/01/2013   Procedure: DRAINAGE OF PLEURAL EFFUSION;  Surgeon: Rexene Alberts, MD;  Location: [facility];  Service: Thoracic;  Laterality: Right;    MEDICATIONS: . acetaminophen (TYLENOL) 500 MG tablet  . bethanechol (URECHOLINE) 50 MG tablet  . Biotin 5000 MCG CAPS  . cyclobenzaprine (FLEXERIL) 10 MG tablet  . diclofenac (VOLTAREN) 75 MG EC tablet  . diphenhydramine-acetaminophen (TYLENOL PM) 25-500 MG TABS  . donepezil (ARICEPT) 10 MG tablet  . finasteride (PROSCAR) 5 MG tablet  . fluticasone (FLONASE) 50 MCG/ACT nasal spray  . gabapentin (NEURONTIN) 800 MG tablet  . L-Methylfolate-Algae-B12-B6 (METANX) 3-90.314-2-35 MG CAPS  . LORazepam (ATIVAN) 2 MG tablet  . Misc Natural Products (FOCUSED MIND PO)  . nicotine polacrilex (COMMIT) 4 MG lozenge  . omeprazole (PRILOSEC) 20 MG capsule  . OVER THE COUNTER MEDICATION  . OVER THE COUNTER MEDICATION  . OVER THE COUNTER MEDICATION  . oxyCODONE (OXY IR/ROXICODONE) 5 MG immediate release tablet  . sildenafil (VIAGRA) 100 MG tablet  . TECFIDERA 240 MG CPDR  . traZODone (DESYREL) 50 MG tablet   No current facility-administered medications for this encounter.     George Hugh [facility] Short Stay Center/Anesthesiology Phone [phone] 11/24/2018 9:08 AM

## 2018-11-24 NOTE — Anesthesia Preprocedure Evaluation (Addendum)
Anesthesia Evaluation  Patient identified by MRN, date of birth, ID band Patient awake    Reviewed: Allergy & Precautions, NPO status , Patient's Chart, lab work & pertinent test results  Airway Mallampati: II  TM Distance: >3 FB Neck ROM: Full    Dental  (+) Teeth Intact   Pulmonary former smoker,    breath sounds clear to auscultation       Cardiovascular  Rhythm:Regular Rate:Normal     Neuro/Psych    GI/Hepatic   Endo/Other    Renal/GU      Musculoskeletal   Abdominal   Peds  Hematology   Anesthesia Other Findings   Reproductive/Obstetrics                            Anesthesia Physical Anesthesia Plan  ASA: III  Anesthesia Plan: General   Post-op Pain Management:    Induction: Intravenous  PONV Risk Score and Plan: Ondansetron and Dexamethasone  Airway Management Planned: Oral ETT  Additional Equipment:   Intra-op Plan:   Post-operative Plan: Extubation in OR  Informed Consent: I have reviewed the patients History and Physical, chart, labs and discussed the procedure including the risks, benefits and alternatives for the proposed anesthesia with the patient or authorized representative who has indicated his/her understanding and acceptance.     Plan Discussed with: CRNA and Anesthesiologist  Anesthesia Plan Comments: (PAT note written 11/24/2018 by Myra Gianotti, PA-C. )       Anesthesia Quick Evaluation

## 2018-11-25 ENCOUNTER — Encounter (HOSPITAL_COMMUNITY)
Admission: RE | Disposition: A | Discharge status: Home / Self Care | Payer: Self-pay | Source: Home / Self Care | Attending: Neurosurgery

## 2018-11-25 ENCOUNTER — Observation Stay (HOSPITAL_COMMUNITY)
Admission: RE | Admit: 2018-11-25 | Discharge: 2018-11-25 | Disposition: A | Discharge status: Home / Self Care | Payer: 59 | Attending: Neurosurgery | Admitting: Neurosurgery

## 2018-11-25 ENCOUNTER — Ambulatory Visit (HOSPITAL_COMMUNITY): Payer: 59

## 2018-11-25 ENCOUNTER — Encounter (HOSPITAL_COMMUNITY): Payer: Self-pay | Admitting: Anesthesiology

## 2018-11-25 ENCOUNTER — Ambulatory Visit (HOSPITAL_COMMUNITY): Payer: 59 | Admitting: Vascular Surgery

## 2018-11-25 ENCOUNTER — Ambulatory Visit (HOSPITAL_COMMUNITY): Payer: 59 | Admitting: Anesthesiology

## 2018-11-25 DIAGNOSIS — M5116 Intervertebral disc disorders with radiculopathy, lumbar region: Principal | ICD-10-CM | POA: Insufficient documentation

## 2018-11-25 DIAGNOSIS — G4700 Insomnia, unspecified: Secondary | ICD-10-CM | POA: Diagnosis not present

## 2018-11-25 DIAGNOSIS — Z981 Arthrodesis status: Secondary | ICD-10-CM | POA: Insufficient documentation

## 2018-11-25 DIAGNOSIS — Z87891 Personal history of nicotine dependence: Secondary | ICD-10-CM | POA: Insufficient documentation

## 2018-11-25 DIAGNOSIS — M5126 Other intervertebral disc displacement, lumbar region: Secondary | ICD-10-CM | POA: Diagnosis present

## 2018-11-25 DIAGNOSIS — Z888 Allergy status to other drugs, medicaments and biological substances status: Secondary | ICD-10-CM | POA: Insufficient documentation

## 2018-11-25 DIAGNOSIS — Z881 Allergy status to other antibiotic agents status: Secondary | ICD-10-CM | POA: Insufficient documentation

## 2018-11-25 DIAGNOSIS — Z885 Allergy status to narcotic agent status: Secondary | ICD-10-CM | POA: Insufficient documentation

## 2018-11-25 DIAGNOSIS — G629 Polyneuropathy, unspecified: Secondary | ICD-10-CM | POA: Insufficient documentation

## 2018-11-25 DIAGNOSIS — K3184 Gastroparesis: Secondary | ICD-10-CM | POA: Insufficient documentation

## 2018-11-25 DIAGNOSIS — Z79899 Other long term (current) drug therapy: Secondary | ICD-10-CM | POA: Insufficient documentation

## 2018-11-25 DIAGNOSIS — K219 Gastro-esophageal reflux disease without esophagitis: Secondary | ICD-10-CM | POA: Diagnosis not present

## 2018-11-25 DIAGNOSIS — G35 Multiple sclerosis: Secondary | ICD-10-CM | POA: Insufficient documentation

## 2018-11-25 DIAGNOSIS — Z419 Encounter for procedure for purposes other than remedying health state, unspecified: Secondary | ICD-10-CM

## 2018-11-25 HISTORY — PX: LUMBAR LAMINECTOMY/DECOMPRESSION MICRODISCECTOMY: SHX5026

## 2018-11-25 SURGERY — LUMBAR LAMINECTOMY/DECOMPRESSION MICRODISCECTOMY 1 LEVEL
Anesthesia: General | Site: Back | Laterality: Left

## 2018-11-25 MED ORDER — 0.9 % SODIUM CHLORIDE (POUR BTL) OPTIME
TOPICAL | Status: DC | PRN
  Administered 2018-11-25: 1000 mL

## 2018-11-25 MED ORDER — ONDANSETRON HCL 4 MG/2ML IJ SOLN
INTRAMUSCULAR | Status: DC | PRN
  Administered 2018-11-25: 4 mg via INTRAVENOUS

## 2018-11-25 MED ORDER — FENTANYL CITRATE (PF) 100 MCG/2ML IJ SOLN
INTRAMUSCULAR | Status: AC
  Filled 2018-11-25: qty 2

## 2018-11-25 MED ORDER — DONEPEZIL HCL 10 MG PO TABS
10.0000 mg | ORAL_TABLET | Freq: Every day | ORAL | Status: DC
  Administered 2018-11-25: 10 mg via ORAL
  Filled 2018-11-25: qty 1

## 2018-11-25 MED ORDER — ONDANSETRON HCL 4 MG/2ML IJ SOLN
INTRAMUSCULAR | Status: AC
  Filled 2018-11-25: qty 2

## 2018-11-25 MED ORDER — LACTATED RINGERS IV SOLN
INTRAVENOUS | Status: DC | PRN
  Administered 2018-11-25: 09:00:00 via INTRAVENOUS

## 2018-11-25 MED ORDER — LIDOCAINE 2% (20 MG/ML) 5 ML SYRINGE
INTRAMUSCULAR | Status: DC | PRN
  Administered 2018-11-25: 40 mg via INTRAVENOUS

## 2018-11-25 MED ORDER — CHLORHEXIDINE GLUCONATE CLOTH 2 % EX PADS: 6.0000 | MEDICATED_PAD | Freq: Once | CUTANEOUS | Status: DC

## 2018-11-25 MED ORDER — BUPIVACAINE HCL (PF) 0.5 % IJ SOLN
INTRAMUSCULAR | Status: AC
  Filled 2018-11-25: qty 30

## 2018-11-25 MED ORDER — LORAZEPAM 0.5 MG PO TABS: 2.0000 mg | ORAL_TABLET | Freq: Every day | ORAL | Status: DC

## 2018-11-25 MED ORDER — BETHANECHOL CHLORIDE 25 MG PO TABS
50.0000 mg | ORAL_TABLET | Freq: Four times a day (QID) | ORAL | Status: DC
  Administered 2018-11-25 (×2): 50 mg via ORAL
  Filled 2018-11-25 (×3): qty 2

## 2018-11-25 MED ORDER — LACTATED RINGERS IV SOLN
INTRAVENOUS | Status: DC
  Administered 2018-11-25: 08:00:00 via INTRAVENOUS

## 2018-11-25 MED ORDER — FENTANYL CITRATE (PF) 100 MCG/2ML IJ SOLN: 25.0000 ug | INTRAMUSCULAR | Status: DC | PRN

## 2018-11-25 MED ORDER — THROMBIN 5000 UNITS EX SOLR
CUTANEOUS | Status: AC
  Filled 2018-11-25: qty 15000

## 2018-11-25 MED ORDER — LIDOCAINE-EPINEPHRINE 1 %-1:100000 IJ SOLN
INTRAMUSCULAR | Status: DC | PRN
  Administered 2018-11-25: 10 mL

## 2018-11-25 MED ORDER — ROCURONIUM BROMIDE 50 MG/5ML IV SOSY
PREFILLED_SYRINGE | INTRAVENOUS | Status: AC
  Filled 2018-11-25: qty 5

## 2018-11-25 MED ORDER — SODIUM CHLORIDE 0.9% FLUSH: 3.0000 mL | Freq: Two times a day (BID) | INTRAVENOUS | Status: DC

## 2018-11-25 MED ORDER — DIPHENHYDRAMINE HCL 50 MG PO CAPS
50.0000 mg | ORAL_CAPSULE | Freq: Once | ORAL | Status: DC
  Filled 2018-11-25: qty 1

## 2018-11-25 MED ORDER — GENTAMICIN IN SALINE 1.6-0.9 MG/ML-% IV SOLN
80.0000 mg | INTRAVENOUS | Status: AC
  Administered 2018-11-25: 80 mg via INTRAVENOUS
  Filled 2018-11-25: qty 50

## 2018-11-25 MED ORDER — HYDROXYZINE HCL 50 MG/ML IM SOLN: 50.0000 mg | INTRAMUSCULAR | Status: DC | PRN

## 2018-11-25 MED ORDER — SODIUM CHLORIDE 0.9 % IV SOLN
INTRAVENOUS | Status: DC | PRN
  Administered 2018-11-25: 10:00:00

## 2018-11-25 MED ORDER — EPHEDRINE 5 MG/ML INJ
INTRAVENOUS | Status: AC
  Filled 2018-11-25: qty 10

## 2018-11-25 MED ORDER — KETOROLAC TROMETHAMINE 30 MG/ML IJ SOLN
30.0000 mg | Freq: Once | INTRAMUSCULAR | Status: AC
  Administered 2018-11-25: 30 mg via INTRAVENOUS

## 2018-11-25 MED ORDER — PROPOFOL 10 MG/ML IV BOLUS
INTRAVENOUS | Status: DC | PRN
  Administered 2018-11-25: 200 mg via INTRAVENOUS

## 2018-11-25 MED ORDER — ACETAMINOPHEN 10 MG/ML IV SOLN
INTRAVENOUS | Status: AC
  Filled 2018-11-25: qty 100

## 2018-11-25 MED ORDER — PANTOPRAZOLE SODIUM 40 MG PO TBEC: 40.0000 mg | DELAYED_RELEASE_TABLET | Freq: Every day | ORAL | Status: DC

## 2018-11-25 MED ORDER — LIDOCAINE-EPINEPHRINE 1 %-1:100000 IJ SOLN
INTRAMUSCULAR | Status: AC
  Filled 2018-11-25: qty 1

## 2018-11-25 MED ORDER — PHENYLEPHRINE 40 MCG/ML (10ML) SYRINGE FOR IV PUSH (FOR BLOOD PRESSURE SUPPORT)
PREFILLED_SYRINGE | INTRAVENOUS | Status: AC
  Filled 2018-11-25: qty 10

## 2018-11-25 MED ORDER — MIDAZOLAM HCL 2 MG/2ML IJ SOLN
INTRAMUSCULAR | Status: AC
  Filled 2018-11-25: qty 2

## 2018-11-25 MED ORDER — FENTANYL CITRATE (PF) 100 MCG/2ML IJ SOLN
INTRAMUSCULAR | Status: DC | PRN
  Administered 2018-11-25: 100 ug via INTRAVENOUS

## 2018-11-25 MED ORDER — FENTANYL CITRATE (PF) 250 MCG/5ML IJ SOLN
INTRAMUSCULAR | Status: AC
  Filled 2018-11-25: qty 5

## 2018-11-25 MED ORDER — FLEET ENEMA 7-19 GM/118ML RE ENEM: 1.0000 | ENEMA | Freq: Once | RECTAL | Status: DC | PRN

## 2018-11-25 MED ORDER — METHYLPREDNISOLONE ACETATE 80 MG/ML IJ SUSP
INTRAMUSCULAR | Status: DC | PRN
  Administered 2018-11-25: 80 mg

## 2018-11-25 MED ORDER — TRAZODONE HCL 100 MG PO TABS
100.0000 mg | ORAL_TABLET | Freq: Every day | ORAL | Status: DC
  Filled 2018-11-25: qty 1

## 2018-11-25 MED ORDER — ACETAMINOPHEN 10 MG/ML IV SOLN
INTRAVENOUS | Status: DC | PRN
  Administered 2018-11-25: 1000 mg via INTRAVENOUS

## 2018-11-25 MED ORDER — DIPHENHYDRAMINE HCL 25 MG PO CAPS
ORAL_CAPSULE | ORAL | Status: AC
  Filled 2018-11-25: qty 2

## 2018-11-25 MED ORDER — KETOROLAC TROMETHAMINE 30 MG/ML IJ SOLN
INTRAMUSCULAR | Status: AC
  Administered 2018-11-25: 30 mg via INTRAVENOUS
  Filled 2018-11-25: qty 1

## 2018-11-25 MED ORDER — SODIUM CHLORIDE 0.9 % IV SOLN: 250.0000 mL | INTRAVENOUS | Status: DC

## 2018-11-25 MED ORDER — LIDOCAINE 2% (20 MG/ML) 5 ML SYRINGE
INTRAMUSCULAR | Status: AC
  Filled 2018-11-25: qty 10

## 2018-11-25 MED ORDER — THROMBIN 5000 UNITS EX SOLR
CUTANEOUS | Status: DC | PRN
  Administered 2018-11-25 (×2): 5000 [IU] via TOPICAL

## 2018-11-25 MED ORDER — SODIUM CHLORIDE 0.9 % IV SOLN
INTRAVENOUS | Status: DC | PRN
  Administered 2018-11-25: 20 ug/min via INTRAVENOUS

## 2018-11-25 MED ORDER — KETOROLAC TROMETHAMINE 30 MG/ML IJ SOLN
30.0000 mg | Freq: Four times a day (QID) | INTRAMUSCULAR | Status: DC
  Administered 2018-11-25: 30 mg via INTRAVENOUS
  Filled 2018-11-25: qty 1

## 2018-11-25 MED ORDER — CYCLOBENZAPRINE HCL 5 MG PO TABS: 5.0000 mg | ORAL_TABLET | Freq: Three times a day (TID) | ORAL | Status: DC | PRN

## 2018-11-25 MED ORDER — METHYLPREDNISOLONE ACETATE 80 MG/ML IJ SUSP
INTRAMUSCULAR | Status: AC
  Filled 2018-11-25: qty 1

## 2018-11-25 MED ORDER — MIDAZOLAM HCL 5 MG/5ML IJ SOLN
INTRAMUSCULAR | Status: DC | PRN
  Administered 2018-11-25: 2 mg via INTRAVENOUS

## 2018-11-25 MED ORDER — MAGNESIUM HYDROXIDE 400 MG/5ML PO SUSP: 30.0000 mL | Freq: Every day | ORAL | Status: DC | PRN

## 2018-11-25 MED ORDER — GABAPENTIN 800 MG PO TABS
800.0000 mg | ORAL_TABLET | Freq: Three times a day (TID) | ORAL | Status: DC
  Filled 2018-11-25: qty 1

## 2018-11-25 MED ORDER — DEXAMETHASONE SODIUM PHOSPHATE 10 MG/ML IJ SOLN
INTRAMUSCULAR | Status: DC | PRN
  Administered 2018-11-25: 10 mg via INTRAVENOUS

## 2018-11-25 MED ORDER — VANCOMYCIN HCL IN DEXTROSE 1-5 GM/200ML-% IV SOLN
INTRAVENOUS | Status: AC
  Filled 2018-11-25: qty 200

## 2018-11-25 MED ORDER — HEMOSTATIC AGENTS (NO CHARGE) OPTIME
TOPICAL | Status: DC | PRN
  Administered 2018-11-25: 1 via TOPICAL

## 2018-11-25 MED ORDER — DIMETHYL FUMARATE 240 MG PO CPDR: 240.0000 mg | DELAYED_RELEASE_CAPSULE | Freq: Two times a day (BID) | ORAL | Status: DC

## 2018-11-25 MED ORDER — FINASTERIDE 5 MG PO TABS: 5.0000 mg | ORAL_TABLET | Freq: Every day | ORAL | Status: DC

## 2018-11-25 MED ORDER — OXYCODONE-ACETAMINOPHEN 5-325 MG PO TABS: 0.5000 | ORAL_TABLET | ORAL | 0 refills | Status: DC | PRN

## 2018-11-25 MED ORDER — PROPOFOL 10 MG/ML IV BOLUS
INTRAVENOUS | Status: AC
  Filled 2018-11-25: qty 20

## 2018-11-25 MED ORDER — HYDROXYZINE HCL 25 MG PO TABS: 50.0000 mg | ORAL_TABLET | ORAL | Status: DC | PRN

## 2018-11-25 MED ORDER — MENTHOL 3 MG MT LOZG: 1.0000 | LOZENGE | OROMUCOSAL | Status: DC | PRN

## 2018-11-25 MED ORDER — SUGAMMADEX SODIUM 200 MG/2ML IV SOLN
INTRAVENOUS | Status: DC | PRN
  Administered 2018-11-25: 200 mg via INTRAVENOUS

## 2018-11-25 MED ORDER — LORAZEPAM 2 MG PO TABS: 2.0000 mg | ORAL_TABLET | Freq: Every day | ORAL | Status: DC

## 2018-11-25 MED ORDER — MIDAZOLAM HCL 2 MG/2ML IJ SOLN: 2.0000 mg | Freq: Once | INTRAMUSCULAR | Status: DC

## 2018-11-25 MED ORDER — KCL IN DEXTROSE-NACL 20-5-0.45 MEQ/L-%-% IV SOLN: INTRAVENOUS | Status: DC

## 2018-11-25 MED ORDER — VANCOMYCIN HCL IN DEXTROSE 1-5 GM/200ML-% IV SOLN
1000.0000 mg | INTRAVENOUS | Status: AC
  Administered 2018-11-25: 1000 mg via INTRAVENOUS

## 2018-11-25 MED ORDER — PHENOL 1.4 % MT LIQD: 1.0000 | OROMUCOSAL | Status: DC | PRN

## 2018-11-25 MED ORDER — SUCCINYLCHOLINE CHLORIDE 200 MG/10ML IV SOSY
PREFILLED_SYRINGE | INTRAVENOUS | Status: AC
  Filled 2018-11-25: qty 10

## 2018-11-25 MED ORDER — ONDANSETRON HCL 4 MG/2ML IJ SOLN: 4.0000 mg | Freq: Once | INTRAMUSCULAR | Status: DC | PRN

## 2018-11-25 MED ORDER — ACETAMINOPHEN 325 MG PO TABS: 650.0000 mg | ORAL_TABLET | ORAL | Status: DC | PRN

## 2018-11-25 MED ORDER — GABAPENTIN 400 MG PO CAPS
800.0000 mg | ORAL_CAPSULE | Freq: Three times a day (TID) | ORAL | Status: DC
  Administered 2018-11-25: 800 mg via ORAL
  Filled 2018-11-25: qty 2

## 2018-11-25 MED ORDER — BUPIVACAINE HCL (PF) 0.5 % IJ SOLN
INTRAMUSCULAR | Status: DC | PRN
  Administered 2018-11-25: 10 mL

## 2018-11-25 MED ORDER — BISACODYL 10 MG RE SUPP: 10.0000 mg | Freq: Every day | RECTAL | Status: DC | PRN

## 2018-11-25 MED ORDER — FENTANYL CITRATE (PF) 250 MCG/5ML IJ SOLN
INTRAMUSCULAR | Status: DC | PRN
  Administered 2018-11-25: 25 ug via INTRAVENOUS
  Administered 2018-11-25 (×3): 50 ug via INTRAVENOUS

## 2018-11-25 MED ORDER — ALUM & MAG HYDROXIDE-SIMETH 200-200-20 MG/5ML PO SUSP: 30.0000 mL | Freq: Four times a day (QID) | ORAL | Status: DC | PRN

## 2018-11-25 MED ORDER — ROCURONIUM BROMIDE 10 MG/ML (PF) SYRINGE
PREFILLED_SYRINGE | INTRAVENOUS | Status: DC | PRN
  Administered 2018-11-25 (×2): 50 mg via INTRAVENOUS

## 2018-11-25 MED ORDER — DEXAMETHASONE SODIUM PHOSPHATE 10 MG/ML IJ SOLN
INTRAMUSCULAR | Status: AC
  Filled 2018-11-25: qty 1

## 2018-11-25 MED ORDER — FLUTICASONE PROPIONATE 50 MCG/ACT NA SUSP
2.0000 | Freq: Every day | NASAL | Status: DC
  Filled 2018-11-25: qty 16

## 2018-11-25 MED ORDER — ACETAMINOPHEN 650 MG RE SUPP: 650.0000 mg | RECTAL | Status: DC | PRN

## 2018-11-25 MED ORDER — HYDROMORPHONE HCL 1 MG/ML IJ SOLN: 1.0000 mg | INTRAMUSCULAR | Status: DC | PRN

## 2018-11-25 MED ORDER — SODIUM CHLORIDE 0.9% FLUSH: 3.0000 mL | INTRAVENOUS | Status: DC | PRN

## 2018-11-25 MED ORDER — OXYCODONE-ACETAMINOPHEN 5-325 MG PO TABS: 1.0000 | ORAL_TABLET | ORAL | Status: DC | PRN

## 2018-11-25 SURGICAL SUPPLY — 58 items
ADH SKN CLS APL DERMABOND .7 (GAUZE/BANDAGES/DRESSINGS) ×1
APL SKNCLS STERI-STRIP NONHPOA (GAUZE/BANDAGES/DRESSINGS)
BAG DECANTER FOR FLEXI CONT (MISCELLANEOUS) ×2 IMPLANT
BENZOIN TINCTURE PRP APPL 2/3 (GAUZE/BANDAGES/DRESSINGS) IMPLANT
BLADE CLIPPER SURG (BLADE) ×1 IMPLANT
BUR ACRON 5.0MM COATED (BURR) ×1 IMPLANT
BUR MATCHSTICK NEURO 3.0 LAGG (BURR) ×2 IMPLANT
CANISTER SUCT 3000ML PPV (MISCELLANEOUS) ×2 IMPLANT
CARTRIDGE OIL MAESTRO DRILL (MISCELLANEOUS) ×1 IMPLANT
COVER WAND RF STERILE (DRAPES) ×1 IMPLANT
DECANTER SPIKE VIAL GLASS SM (MISCELLANEOUS) ×3 IMPLANT
DERMABOND ADVANCED (GAUZE/BANDAGES/DRESSINGS) ×1
DERMABOND ADVANCED .7 DNX12 (GAUZE/BANDAGES/DRESSINGS) ×1 IMPLANT
DIFFUSER DRILL AIR PNEUMATIC (MISCELLANEOUS) ×2 IMPLANT
DRAPE LAPAROTOMY 100X72X124 (DRAPES) ×2 IMPLANT
DRAPE MICROSCOPE LEICA (MISCELLANEOUS) ×2 IMPLANT
DRAPE POUCH INSTRU U-SHP 10X18 (DRAPES) ×1 IMPLANT
ELECT REM PT RETURN 9FT ADLT (ELECTROSURGICAL) ×2
ELECTRODE REM PT RTRN 9FT ADLT (ELECTROSURGICAL) ×1 IMPLANT
GAUZE 4X4 16PLY RFD (DISPOSABLE) IMPLANT
GAUZE SPONGE 4X4 12PLY STRL (GAUZE/BANDAGES/DRESSINGS) IMPLANT
GLOVE BIOGEL PI IND STRL 7.5 (GLOVE) IMPLANT
GLOVE BIOGEL PI IND STRL 8 (GLOVE) ×1 IMPLANT
GLOVE BIOGEL PI INDICATOR 7.5 (GLOVE) ×1
GLOVE BIOGEL PI INDICATOR 8 (GLOVE) ×5
GLOVE ECLIPSE 7.5 STRL STRAW (GLOVE) ×7 IMPLANT
GLOVE EXAM NITRILE XL STR (GLOVE) IMPLANT
GOWN STRL REUS W/ TWL LRG LVL3 (GOWN DISPOSABLE) IMPLANT
GOWN STRL REUS W/ TWL XL LVL3 (GOWN DISPOSABLE) ×1 IMPLANT
GOWN STRL REUS W/TWL 2XL LVL3 (GOWN DISPOSABLE) ×2 IMPLANT
GOWN STRL REUS W/TWL LRG LVL3 (GOWN DISPOSABLE)
GOWN STRL REUS W/TWL XL LVL3 (GOWN DISPOSABLE) ×2
KIT BASIN OR (CUSTOM PROCEDURE TRAY) ×2 IMPLANT
KIT TURNOVER KIT B (KITS) ×2 IMPLANT
NDL HYPO 18GX1.5 BLUNT FILL (NEEDLE) IMPLANT
NDL SPNL 18GX3.5 QUINCKE PK (NEEDLE) ×1 IMPLANT
NDL SPNL 22GX3.5 QUINCKE BK (NEEDLE) ×1 IMPLANT
NEEDLE HYPO 18GX1.5 BLUNT FILL (NEEDLE) ×2 IMPLANT
NEEDLE SPNL 18GX3.5 QUINCKE PK (NEEDLE) ×4 IMPLANT
NEEDLE SPNL 22GX3.5 QUINCKE BK (NEEDLE) ×2 IMPLANT
NS IRRIG 1000ML POUR BTL (IV SOLUTION) ×2 IMPLANT
OIL CARTRIDGE MAESTRO DRILL (MISCELLANEOUS) ×2
PACK LAMINECTOMY NEURO (CUSTOM PROCEDURE TRAY) ×2 IMPLANT
PAD ARMBOARD 7.5X6 YLW CONV (MISCELLANEOUS) ×6 IMPLANT
PATTIES SURGICAL .5 X1 (DISPOSABLE) IMPLANT
RUBBERBAND STERILE (MISCELLANEOUS) ×4 IMPLANT
SPONGE LAP 4X18 RFD (DISPOSABLE) IMPLANT
SPONGE SURGIFOAM ABS GEL SZ50 (HEMOSTASIS) ×2 IMPLANT
STRIP CLOSURE SKIN 1/2X4 (GAUZE/BANDAGES/DRESSINGS) IMPLANT
SUT PROLENE 6 0 BV (SUTURE) IMPLANT
SUT VIC AB 1 CT1 18XBRD ANBCTR (SUTURE) ×1 IMPLANT
SUT VIC AB 1 CT1 8-18 (SUTURE) ×2
SUT VIC AB 2-0 CP2 18 (SUTURE) ×2 IMPLANT
SUT VIC AB 3-0 SH 8-18 (SUTURE) ×1 IMPLANT
SYR 5ML LL (SYRINGE) ×1 IMPLANT
TOWEL GREEN STERILE (TOWEL DISPOSABLE) ×2 IMPLANT
TOWEL GREEN STERILE FF (TOWEL DISPOSABLE) ×2 IMPLANT
WATER STERILE IRR 1000ML POUR (IV SOLUTION) ×2 IMPLANT

## 2018-11-25 NOTE — Discharge Instructions (Signed)

## 2018-11-25 NOTE — Transfer of Care (Signed)
Immediate Anesthesia Transfer of Care Note  Patient: [name]  Procedure(s) Performed: LEFT LUMBAR THREE- LUMBAR FOUR EXTRAFORAMINAL MICRODISCECTOMY (Left Back)  Patient Location: PACU  Anesthesia Type:General  Level of Consciousness: awake, alert  and oriented  Airway & Oxygen Therapy: Patient Spontanous Breathing  Post-op Assessment: Report given to RN and Post -op Vital signs reviewed and stable  Post vital signs: Reviewed and stable  Last Vitals:  Vitals Value Taken Time  BP 123/62 11/25/2018 10:40 AM  Temp    Pulse 84 11/25/2018 10:42 AM  Resp 24 11/25/2018 10:42 AM  SpO2 100 % 11/25/2018 10:42 AM  Vitals shown include unvalidated device data.  Last Pain:  Vitals:   11/25/18 0715  TempSrc:   PainSc: 3          Complications: No apparent anesthesia complications

## 2018-11-25 NOTE — Anesthesia Postprocedure Evaluation (Signed)
Anesthesia Post Note  Patient: [name]  Procedure(s) Performed: LEFT LUMBAR THREE- LUMBAR FOUR EXTRAFORAMINAL MICRODISCECTOMY (Left Back)     Patient location during evaluation: PACU Anesthesia Type: General Level of consciousness: awake and alert Pain management: pain level controlled Vital Signs Assessment: post-procedure vital signs reviewed and stable Respiratory status: spontaneous breathing, nonlabored ventilation, respiratory function stable and patient connected to nasal cannula oxygen Cardiovascular status: blood pressure returned to baseline and stable Postop Assessment: no apparent nausea or vomiting Anesthetic complications: no    Last Vitals:  Vitals:   11/25/18 1147 11/25/18 1621  BP: 126/61 133/75  Pulse: 68 90  Resp: 18 19  Temp:  36.6 C  SpO2: 99% 96%    Last Pain:  Vitals:   11/25/18 1621  TempSrc: Oral  PainSc:                  [name]

## 2018-11-25 NOTE — Op Note (Signed)
11/25/2018  10:24 AM  PATIENT:  [name]  52 y.o. 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 Patient was turned to prone position the lumbar region was prepped with Betadine soap and solution and draped in a sterile fashion. The midline was infiltrated with local anesthetic with epinephrine. A localizing x-ray was taken and the L3-4 level was identified. Midline incision was made over the L3-4 level and was carried down through the subcutaneous tissue to the lumbar fascia. The lumbar fascia was incised on the left side and the paraspinal muscles were dissected from the spinous processes and lamina in a subperiosteal fashion. Another x-ray was taken and the left L3-4 intertransverse space was identified. The operating microscope was draped and brought into the field provided additional magnification, illumination, and visualization. Dissection was begun in the intertransverse space. The intertransverse ligament was carefully removed. A superolateral facetectomy was performed using the high-speed drill and Kerrison punches. The exiting left L3 nerve root was identified. The disc herniation was identified inferior medial and ventral to the nerve root.  There was a significant free fragment disc herniation, that was removed in a piecemeal fashion.  Fragments of disc were mobilized using a coronary  dilator.  A rent in the annulus was identified, and the discectomy was extended into the disc space.  All loose fragments of disc material were removed from both the extraforaminal space and immediately adjacent disc space.  Good decompression of the left L3 nerve root including the dorsal root ganglion was achieved.  Hemostasis was established with the use of bipolar cautery and Gelfoam with thrombin. The Gelfoam was removed and hemostasis confirmed. We then instilled 2 cc of fentanyl and 80 mg of Depo-Medrol into the extraforaminal space. Deep fascia was closed with interrupted undyed 1 Vicryl sutures. Scarpa's fascia was closed with interrupted undyed 1 Vicryl sutures in the subcutaneous and subcuticular layer were closed with interrupted inverted 2-0 and 3-0 undyed Vicryl sutures. The skin edges were approximated with Dermabond. Following surgery the patient was turned back to a supine position to be reversed from the anesthetic extubated and transferred to the recovery room for further care.  PLAN OF CARE: Admit for overnight observation  PATIENT DISPOSITION:  PACU - hemodynamically stable.   Delay start of Pharmacological VTE agent (>24hrs) due to surgical blood loss or risk of bleeding:  yes

## 2018-11-25 NOTE — Progress Notes (Signed)
Pt doing well. Pt and wife given D/C instructions with Rx, verbal understanding was provided. Pt's incision is clean and dry with no sign of infection. Pt's IV removed prior to D/C. Pt D/C'd home via wheelchair @ 1915 per MD order. Pt is stable @ D/C and has no other needs at this time. Holli Humbles, RN

## 2018-11-25 NOTE — Anesthesia Procedure Notes (Signed)
Procedure Name: Intubation Date/Time: 11/25/2018 8:49 AM Performed by: Marsa Aris, CRNA Pre-anesthesia Checklist: Patient identified, Emergency Drugs available, Suction available and Patient being monitored Patient Re-evaluated:Patient Re-evaluated prior to induction Oxygen Delivery Method: Circle System Utilized Preoxygenation: Pre-oxygenation with 100% oxygen Induction Type: IV induction Ventilation: Mask ventilation without difficulty Laryngoscope Size: Glidescope and 3 Grade View: Grade I Tube type: Oral Tube size: 8.0 mm Number of attempts: 1 Airway Equipment and Method: Stylet,  Oral airway and Video-laryngoscopy Placement Confirmation: ETT inserted through vocal cords under direct vision,  positive ETCO2 and breath sounds checked- equal and bilateral Secured at: 22 cm Tube secured with: Tape Dental Injury: Teeth and Oropharynx as per pre-operative assessment  Comments: C-spine neutrality maintained, dentition unchanged from pre-procedure

## 2018-11-25 NOTE — Discharge Summary (Signed)
Physician Discharge Summary  Patient ID: [name] MRN: [id number] DOB/AGE: 1966-06-16 52 y.o.  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  He has done well following surgery with good relief of his left lumbar radicular pain.  His incision is healing nicely; there is no erythema, swelling, ecchymosis, or drainage.  He is up and ambulating actively.  He is voiding well.  We are discharging him to home with instructions regarding wound care and activities which were given to both the patient and his wife.  He is scheduled follow-up with me in the office in 3 weeks.  Discharge Exam: Blood pressure 133/75, pulse 90, temperature 97.9 F (36.6 C), temperature source Oral, resp. rate 19, SpO2 96 %.  Disposition: Discharge disposition: 01-Home or Self Care       Discharge Instructions    Discharge wound care:   Complete by:  As directed    Leave the wound open to air. Shower daily with the wound uncovered. Water and soapy water should run over the incision area. Do not wash directly on the incision for 2 weeks. Remove the glue after 2 weeks.   Driving Restrictions   Complete by:  As directed    No driving for 2 weeks. May ride in the car locally now. May begin to drive locally in 2 weeks.   Other Restrictions   Complete by:  As directed    Walk gradually increasing distances out in the fresh air at least twice a day. Walking additional 6 times inside the house, gradually increasing distances, daily. No bending, lifting, or twisting. Perform activities between shoulder and waist height (that is at counter height when  standing or table height when sitting).     Allergies as of 11/25/2018      Reactions   Morphine Swelling   Tramadol Other (See Comments)   GI Upset   Ampicillin Swelling, Other (See Comments)   Has patient had a PCN reaction causing immediate rash, facial/tongue/throat swelling, SOB or lightheadedness with hypotension: Unknown Has patient had a PCN reaction causing severe rash involving mucus membranes or skin necrosis: No Has patient had a PCN reaction that required hospitalization: No Has patient had a PCN reaction occurring within the last 10 years: No If all of the above answers are "NO", then may proceed with Cephalosporin use.   Hydrocodone-acetaminophen Other (See Comments)   Unknown   Oxcarbazepine Rash   Vancomycin Rash   Needs benadryl prior to taking Vancomycin      Medication List    STOP taking these medications   oxyCODONE 5 MG immediate release tablet Commonly known as:  Oxy IR/ROXICODONE     TAKE these medications   acetaminophen 500 MG tablet Commonly known as:  TYLENOL Take 1,000 mg by mouth every 6 (six) hours as needed for moderate pain or headache.   bethanechol 50 MG tablet Commonly known as:  URECHOLINE Take 50 mg by mouth 4 (four) times daily.   Biotin 5000 MCG Caps Take 5,000 mcg by mouth daily.   COMMIT 4 MG lozenge Generic drug:  nicotine polacrilex Place 2 mg inside cheek as needed for smoking cessation.   cyclobenzaprine 10 MG tablet Commonly known as:  FLEXERIL Take 10 mg  by mouth 2 (two) times daily.   diclofenac 75 MG EC tablet Commonly known as:  VOLTAREN Take 75 mg by mouth 2 (two) times daily.   diphenhydramine-acetaminophen 25-500 MG Tabs tablet Commonly known as:  TYLENOL PM Take 3 tablets by mouth at bedtime.   donepezil 10 MG tablet Commonly known as:  ARICEPT Take 1 tablet (10 mg total) by mouth daily.   finasteride 5 MG tablet Commonly known as:  PROSCAR Take 5 mg by mouth daily.   fluticasone 50 MCG/ACT nasal  spray Commonly known as:  FLONASE Place 2 sprays into both nostrils daily. USE 2 SPRAYS IN EACH NOSTRIL DAILY AS NEEDED FOR ALLERGIES. GENERIC EQUIVALENT FOR FLONASE What changed:  additional instructions   FOCUSED MIND PO Take 2 tablets by mouth daily.   gabapentin 800 MG tablet Commonly known as:  NEURONTIN TAKE 1 TABLET BY MOUTH THREE TIMES DAILY   LORazepam 2 MG tablet Commonly known as:  ATIVAN TAKE 1 BY MOUTH TWICE DAILY AS NEEDED FOR ANXIETY What changed:    how much to take  how to take this  when to take this  additional instructions   METANX 3-90.314-2-35 MG Caps Take 2 capsules by mouth daily.   omeprazole 20 MG capsule Commonly known as:  PRILOSEC Take 1 capsule (20 mg total) by mouth daily.   OVER THE COUNTER MEDICATION Take 1 tablet by mouth daily. [employer]NC MEN AND GNC CALCIUM/MAGNESIUM   OVER THE COUNTER MEDICATION Take 1 tablet by mouth daily. [employer] Mega Man   OVER THE COUNTER MEDICATION Take 2 tablets by mouth daily. [employer] Triflex   oxyCODONE-acetaminophen 5-325 MG tablet Commonly known as:  PERCOCET/ROXICET Take 0.5-1 tablets by mouth every 4 (four) hours as needed (pain).   sildenafil 100 MG tablet Commonly known as:  VIAGRA Take 100 mg by mouth as needed for erectile dysfunction.   TECFIDERA 240 MG Cpdr Generic drug:  Dimethyl Fumarate Take 240 mg by mouth 2 (two) times daily.   traZODone 50 MG tablet Commonly known as:  DESYREL TAKE 2 BY MOUTH AT BEDTIME AS NEEDED FOR SLEEP What changed:    how much to take  how to take this  when to take this  additional instructions            Discharge Care Instructions  (From admission, onward)         Start     Ordered   11/25/18 0000  Discharge wound care:    Comments:  Leave the wound open to air. Shower daily with the wound uncovered. Water and soapy water should run over the incision area. Do not wash directly on the incision for 2 weeks. Remove the glue after 2 weeks.   11/25/18 1835            Signed: Hosie Spangle 11/25/2018, 6:35 PM

## 2018-11-25 NOTE — Progress Notes (Signed)
Patient states that he has had a rash around the IV site when receiving Vancomycin but if given benadryl tolerates.  Paged Dr. Sherwood Gambler.  Waiting a return call.

## 2018-11-25 NOTE — H&P (Signed)
Subjective: Patient is a 52 y.o. left-handed white male who is admitted for treatment of large left L3-4 lumbar extraforaminal disc herniation.  Patient is over 2 and half month status post a C5-6 ACDF who developed a problem about 6 weeks ago.  2 months ago he fell 4 to 5 feet from a shed roof.  Initially did not have any difficulties but about 3 weeks afterwards he developed acute severe low back pain radiating into the anterior left thigh with associated burning.  The pain was worse with standing and walking he has had continued pain to the left lower extremity as well as numbness and tingling.  Patient was evaluated with MRI scan that revealed a large left L3-4 extraforaminal lumbar disc herniation.  He is admitted now for a left L3-4 extraforaminal microdiscectomy.   Patient Active Problem List   Diagnosis Date Noted  . HNP (herniated nucleus pulposus), cervical 09/02/2018  . Hx of colonic polyps 08/12/2017  . MS (multiple sclerosis) ([place]) 06/03/2013  . Follow-up examination, following unspecified surgery 02/08/2013  . Hemothorax on right 02/08/2013  . Pleural effusion on right 12/31/2012  . Fracture, ribs 12/31/2012  . NICOTINE ADDICTION 05/24/2010  . Dyslipidemia 03/27/2010  . MULTIPLE SCLEROSIS, RELAPSING/REMITTING 03/27/2010  . Allergic rhinitis 03/27/2010  . GERD 03/27/2010   Past Medical History:  Diagnosis Date  . ALLERGIC RHINITIS 03/27/2010  . Fracture, ribs 12/31/2012   Non-displaced fracture of right 8th-11th ribs  . Gait disorder   . Gastroparesis   . GERD 03/27/2010  . Hx of adenomatous polyp of colon 08/12/2017  . HYPERLIPIDEMIA 03/27/2010  . Insomnia   . MULTIPLE SCLEROSIS, RELAPSING/REMITTING 03/27/2010  . Neurogenic bladder   . NICOTINE ADDICTION 05/24/2010  . Peripheral neuropathy   . Pleural effusion on right 12/31/2012   Secondary to rib fractures  . PONV (postoperative nausea and vomiting)   . Rib fracture 11/2012  . TMJ (dislocation of temporomandibular joint)      Past Surgical History:  Procedure Laterality Date  . ANKLE SURGERY    . ANTERIOR CERVICAL DECOMP/DISCECTOMY FUSION N/A 09/02/2018   Procedure: ANTERIOR CERVICAL DECOMPRESSION/DISCECTOMY FUSION CERVICAL FIVE- CERVICAL SIX;  Surgeon: Jovita Gamma, MD;  Location: [facility];  Service: Neurosurgery;  Laterality: N/A;  . COLONOSCOPY    . FOOT SURGERY    . INCISION AND DRAINAGE OF WOUND  2015  . PLEURAL EFFUSION DRAINAGE  01/01/2013   Procedure: DRAINAGE OF PLEURAL EFFUSION;  Surgeon: Rexene Alberts, MD;  Location: [facility];  Service: Thoracic;  Laterality: Right;    Medications Prior to Admission  Medication Sig Dispense Refill Last Dose  . acetaminophen (TYLENOL) 500 MG tablet Take 1,000 mg by mouth every 6 (six) hours as needed for moderate pain or headache.    11/24/2018 at Unknown time  . bethanechol (URECHOLINE) 50 MG tablet Take 50 mg by mouth 4 (four) times daily.    11/24/2018 at Unknown time  . Biotin 5000 MCG CAPS Take 5,000 mcg by mouth daily.    11/24/2018 at Unknown time  . cyclobenzaprine (FLEXERIL) 10 MG tablet Take 10 mg by mouth 2 (two) times daily.   11/24/2018 at Unknown time  . diclofenac (VOLTAREN) 75 MG EC tablet Take 75 mg by mouth 2 (two) times daily.   Past Week at Unknown time  . diphenhydramine-acetaminophen (TYLENOL PM) 25-500 MG TABS Take 3 tablets by mouth at bedtime.    11/24/2018 at Unknown time  . donepezil (ARICEPT) 10 MG tablet Take 1 tablet (10 mg total)  by mouth daily. 90 tablet 2 11/24/2018 at Unknown time  . finasteride (PROSCAR) 5 MG tablet Take 5 mg by mouth daily.   98 09/01/2018 at Unknown time  . fluticasone (FLONASE) 50 MCG/ACT nasal spray Place 2 sprays into both nostrils daily. USE 2 SPRAYS IN EACH NOSTRIL DAILY AS NEEDED FOR ALLERGIES. GENERIC EQUIVALENT FOR FLONASE (Patient taking differently: Place 2 sprays into both nostrils daily. ) 16 g 3 11/25/2018 at Unknown time  . gabapentin (NEURONTIN) 800 MG tablet TAKE 1 TABLET BY MOUTH THREE TIMES DAILY  (Patient taking differently: Take 800 mg by mouth 3 (three) times daily. ) 270 tablet 0 11/24/2018 at Unknown time  . L-Methylfolate-Algae-B12-B6 (METANX) 3-90.314-2-35 MG CAPS Take 2 capsules by mouth daily. 180 capsule 4 11/24/2018 at Unknown time  . LORazepam (ATIVAN) 2 MG tablet TAKE 1 BY MOUTH TWICE DAILY AS NEEDED FOR ANXIETY (Patient taking differently: Take 2 mg by mouth at bedtime. ) 60 tablet 0 11/25/2018 at 0500  . Misc Natural Products (FOCUSED MIND PO) Take 2 tablets by mouth daily.    11/24/2018 at Unknown time  . nicotine polacrilex (COMMIT) 4 MG lozenge Place 2 mg inside cheek as needed for smoking cessation.    11/24/2018 at Unknown time  . omeprazole (PRILOSEC) 20 MG capsule Take 1 capsule (20 mg total) by mouth daily. 90 capsule 2 11/24/2018 at Unknown time  . OVER THE COUNTER MEDICATION Take 1 tablet by mouth daily. [place] MEN AND GNC CALCIUM/MAGNESIUM    11/24/2018 at Unknown time  . OVER THE COUNTER MEDICATION Take 1 tablet by mouth daily. [place] Mega Man   11/24/2018 at Unknown time  . OVER THE COUNTER MEDICATION Take 2 tablets by mouth daily. [place] Triflex    11/24/2018 at Unknown time  . TECFIDERA 240 MG CPDR Take 240 mg by mouth 2 (two) times daily.   4 11/25/2018 at 0500  . traZODone (DESYREL) 50 MG tablet TAKE 2 BY MOUTH AT BEDTIME AS NEEDED FOR SLEEP (Patient taking differently: Take 100 mg by mouth at bedtime. ) 60 tablet 2 11/24/2018 at Unknown time  . oxyCODONE (OXY IR/ROXICODONE) 5 MG immediate release tablet Take 1-2 tablets (5-10 mg total) by mouth every 4 (four) hours as needed (pain). (Patient not taking: Reported on 11/17/2018) 30 tablet 0 Completed Course at Unknown time  . sildenafil (VIAGRA) 100 MG tablet Take 100 mg by mouth as needed for erectile dysfunction.    Taking   Allergies  Allergen Reactions  . Morphine Swelling  . Tramadol Other (See Comments)    GI Upset  . Ampicillin Swelling and Other (See Comments)    Has patient had a PCN reaction causing  immediate rash, facial/tongue/throat swelling, SOB or lightheadedness with hypotension: Unknown Has patient had a PCN reaction causing severe rash involving mucus membranes or skin necrosis: No Has patient had a PCN reaction that required hospitalization: No Has patient had a PCN reaction occurring within the last 10 years: No If all of the above answers are "NO", then may proceed with Cephalosporin use.   Marland Kitchen Hydrocodone-Acetaminophen Other (See Comments)    Unknown  . Oxcarbazepine Rash  . Vancomycin Rash    Needs benadryl prior to taking Vancomycin    Social History   Tobacco Use  . Smoking status: Former Smoker    Packs/day: 0.50    Years: 20.00    Pack years: 10.00    Types: Cigarettes    Last attempt to quit: 10/16/2012    Years since quitting:  6.1  . Smokeless tobacco: Former Systems developer    Quit date: 10/31/2012  Substance Use Topics  . Alcohol use: Yes    Comment: occasionally    Family History  Problem Relation Age of Onset  . Cancer Father        lung ca  . Heart failure Mother   . Colon cancer Neg Hx   . Stomach cancer Neg Hx   . Rectal cancer Neg Hx   . Esophageal cancer Neg Hx      Review of Systems Pertinent items noted in HPI and remainder of comprehensive ROS otherwise negative.  Objective: Vital signs in last 24 hours: Temp:  [97.8 F (36.6 C)] 97.8 F (36.6 C) (12/18 0635) Pulse Rate:  [70] 70 (12/18 0635) Resp:  [18] 18 (12/18 0635) BP: (125)/(70) 125/70 (12/18 0635) SpO2:  [97 %] 97 % (12/18 5790)  EXAM: Patient is a well-developed well-nourished white male in no acute distress.   Lungs are clear to auscultation , the patient has symmetrical respiratory excursion. Heart has a regular rate and rhythm normal S1 and S2 no murmur.   Abdomen is soft nontender nondistended bowel sounds are present. Extremity examination shows no clubbing cyanosis or edema. Motor examination shows the left iliopsoas is 4- to 4/5, otherwise the lower extremity strength is 5/5  including the left iliopsoas and the quadriceps dorsiflexor extensor hallicus  longus and plantar flexor bilaterally. Sensation is intact to pinprick in the distal lower extremities. Reflex examination shows left quadriceps is trace, right quadriceps is 2, gastrocnemius is 1 bilaterally.. No pathologic reflexes are present.  Gait and stance favor the left lower extremity.   Data Review:CBC    Component Value Date/Time   WBC 6.3 11/23/2018 1349   RBC 5.01 11/23/2018 1349   HGB 15.6 11/23/2018 1349   HCT 48.2 11/23/2018 1349   PLT 201 11/23/2018 1349   MCV 96.2 11/23/2018 1349   MCH 31.1 11/23/2018 1349   MCHC 32.4 11/23/2018 1349   RDW 13.1 11/23/2018 1349   RDW 13.3 07/12/2014 1613   LYMPHSABS 0.5 (L) 06/02/2018 1432   LYMPHSABS 1.7 07/12/2014 1613   MONOABS 0.5 06/02/2018 1432   EOSABS 0.1 06/02/2018 1432   EOSABS 0.1 07/12/2014 1613   BASOSABS 0.0 06/02/2018 1432   BASOSABS 0.0 07/12/2014 1613                          BMET    Component Value Date/Time   NA 140 11/23/2018 1349   NA 142 07/12/2014 1613   K 4.0 11/23/2018 1349   CL 104 11/23/2018 1349   CO2 27 11/23/2018 1349   GLUCOSE 103 (H) 11/23/2018 1349   BUN 20 11/23/2018 1349   BUN 21 07/12/2014 1613   CREATININE 1.18 11/23/2018 1349   CREATININE 1.33 12/14/2012 2023   CALCIUM 9.7 11/23/2018 1349   GFRNONAA >60 11/23/2018 1349   GFRAA >60 11/23/2018 1349     Assessment/Plan: Patient with acute left lumbar radiculopathy secondary to left L3-4 extraforaminal disc herniation.  Patient admitted for a left L3-4 extraforaminal microdiscectomy.  I've discussed with the patient the nature of his condition, the nature the surgical procedure, the typical length of surgery, hospital stay, and overall recuperation. We discussed limitations postoperatively. I discussed risks of surgery including risks of infection, bleeding, possibly need for transfusion, the risk of nerve root dysfunction with pain, weakness, numbness, or  paresthesias, or risk of dural tear and CSF leakage and possible  need for further surgery, the risk of recurrent disc herniation and the possible need for further surgery, and the risk of anesthetic complications including myocardial infarction, stroke, pneumonia, and death. Understanding all this the patient does wish to proceed with surgery and is admitted for such.   Hosie Spangle, MD 11/25/2018 8:18 AM

## 2018-11-26 ENCOUNTER — Encounter (HOSPITAL_COMMUNITY): Payer: Self-pay | Admitting: Neurosurgery

## 2018-12-14 DIAGNOSIS — Z981 Arthrodesis status: Secondary | ICD-10-CM | POA: Diagnosis not present

## 2018-12-25 DIAGNOSIS — M5136 Other intervertebral disc degeneration, lumbar region: Secondary | ICD-10-CM | POA: Diagnosis not present

## 2018-12-25 DIAGNOSIS — M5030 Other cervical disc degeneration, unspecified cervical region: Secondary | ICD-10-CM | POA: Diagnosis not present

## 2018-12-25 DIAGNOSIS — Z23 Encounter for immunization: Secondary | ICD-10-CM | POA: Diagnosis not present

## 2018-12-25 DIAGNOSIS — G35 Multiple sclerosis: Secondary | ICD-10-CM | POA: Diagnosis not present

## 2019-01-19 DIAGNOSIS — Z13820 Encounter for screening for osteoporosis: Secondary | ICD-10-CM | POA: Diagnosis not present

## 2019-01-26 DIAGNOSIS — M859 Disorder of bone density and structure, unspecified: Secondary | ICD-10-CM | POA: Diagnosis not present

## 2019-01-26 DIAGNOSIS — Z0000 Encounter for general adult medical examination without abnormal findings: Secondary | ICD-10-CM | POA: Diagnosis not present

## 2019-01-26 DIAGNOSIS — E673 Hypervitaminosis D: Secondary | ICD-10-CM | POA: Diagnosis not present

## 2019-02-07 DIAGNOSIS — B349 Viral infection, unspecified: Secondary | ICD-10-CM | POA: Diagnosis not present

## 2019-03-18 DIAGNOSIS — G35 Multiple sclerosis: Secondary | ICD-10-CM | POA: Diagnosis not present

## 2019-09-19 IMAGING — CR DG CERVICAL SPINE 2 OR 3 VIEWS
2 series · 2 of 2 positions shown · non-contrast
Comparison: August 28, 2018

CLINICAL DATA: Anterior cervical discectomy and decompression.

EXAM:
CERVICAL SPINE - 2-3 VIEW

[AP (1 of 2)]
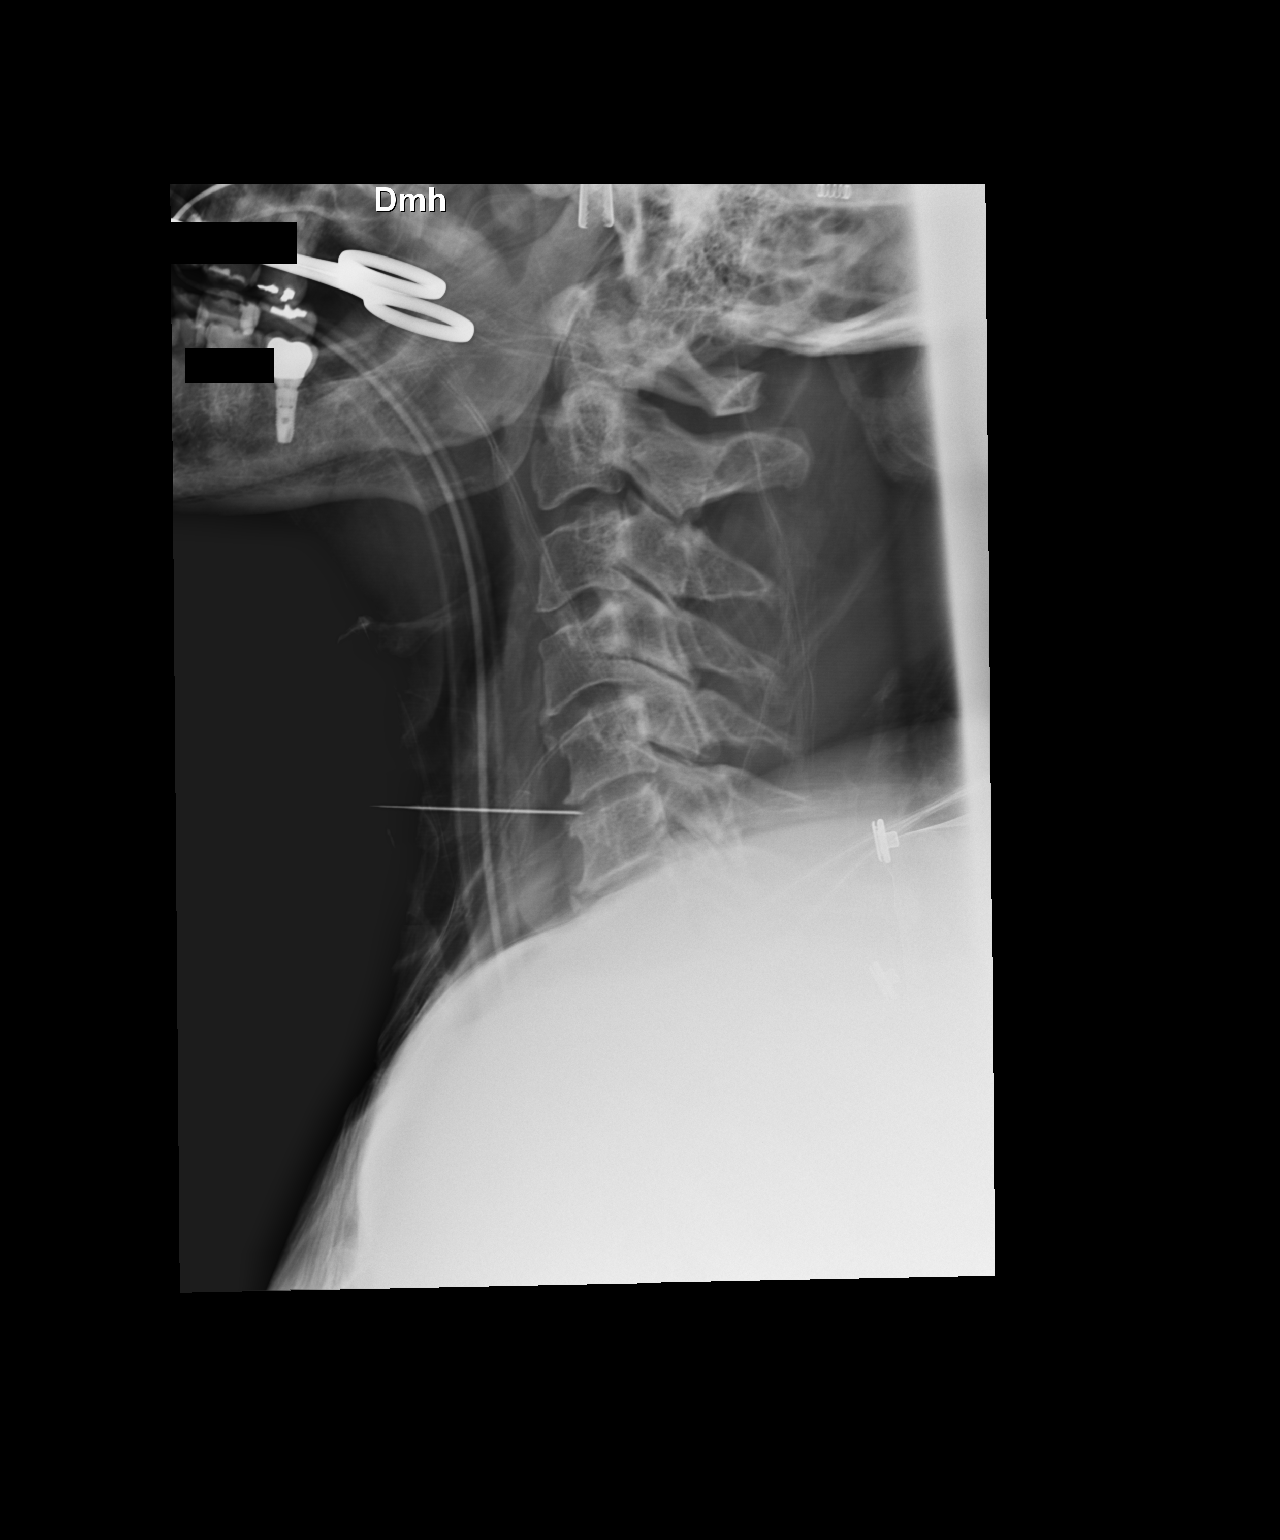

[AP (2 of 2)]
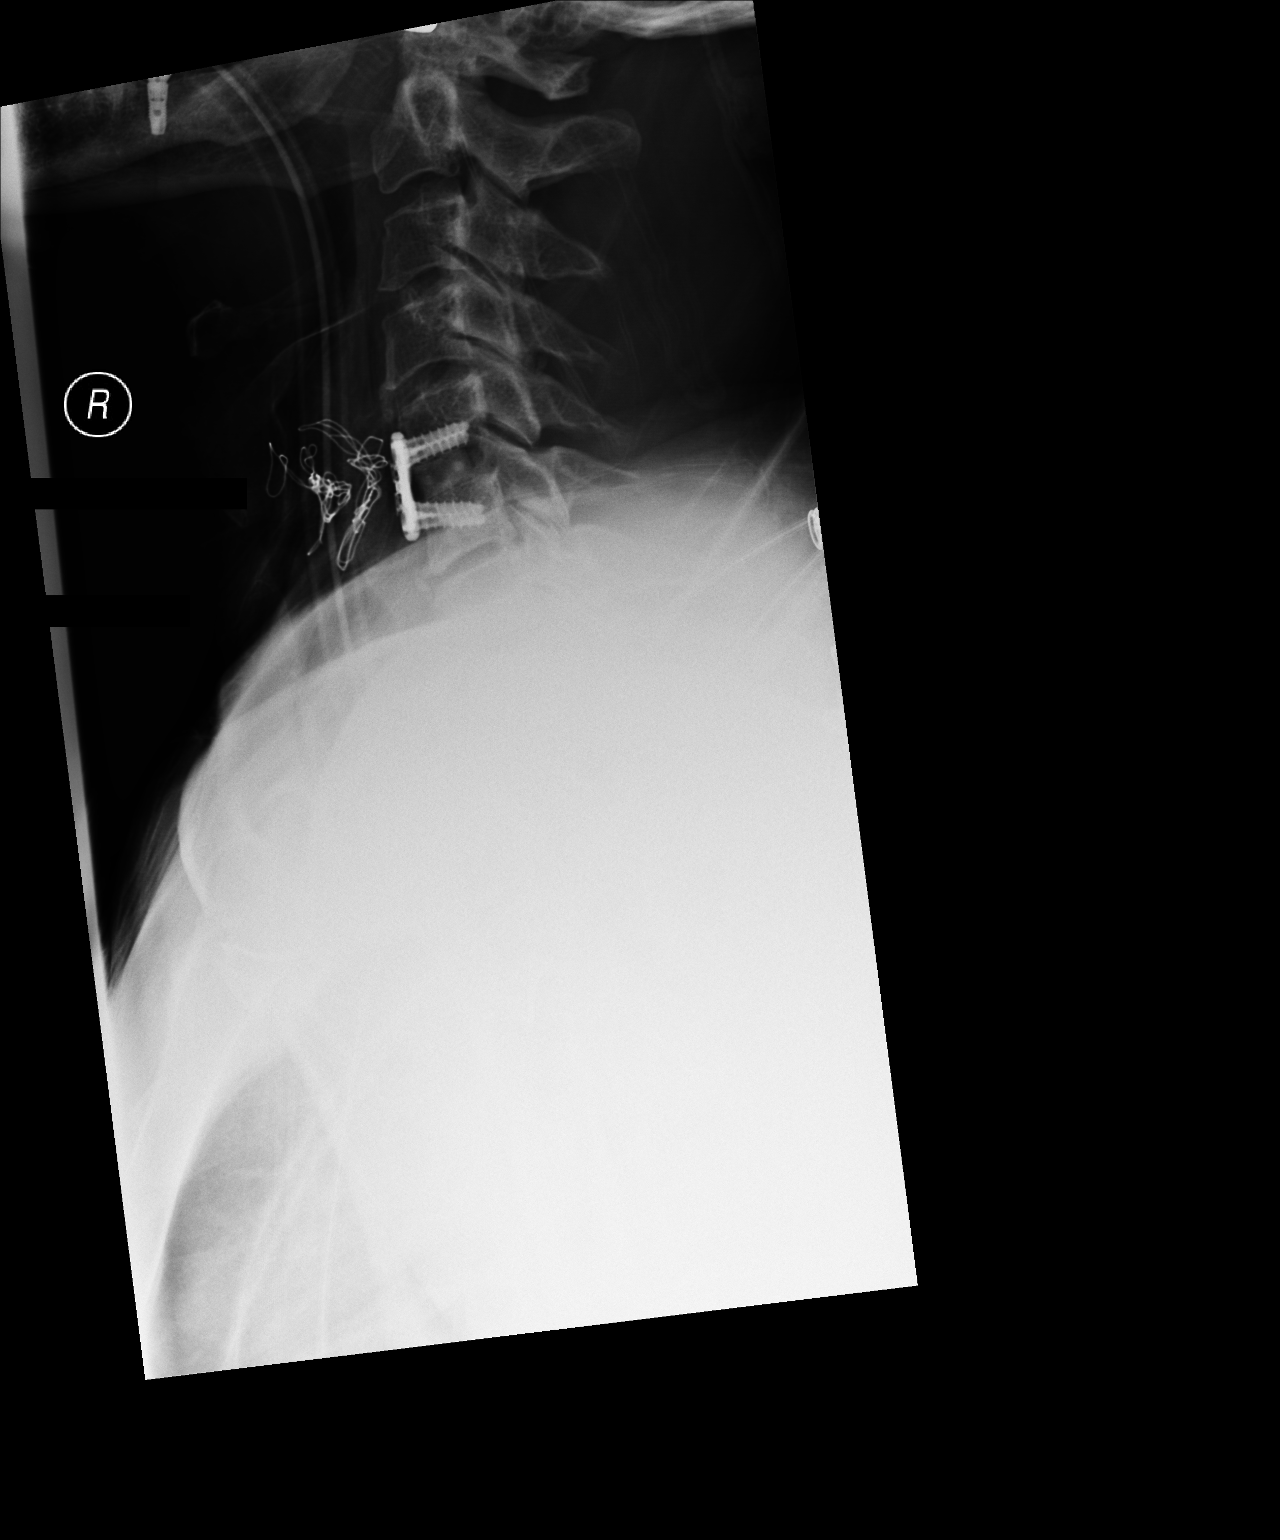

[2 of 2 positions shown; findings below may reference images not displayed]

FINDINGS: Radiopaque needle is identified in the soft tissues of C5-6 level.
On the second image, anterior sideplate with horizontal screws are
seen at the C5-6 level without malalignment.
IMPRESSION: Status post anterior fusion of C5-6 level without malalignment.

## 2019-12-12 IMAGING — CR DG LUMBAR SPINE 2-3V
2 series · 2 of 2 positions shown · non-contrast
Comparison: 11/13/2018

CLINICAL DATA: Elective surgery

EXAM:
LUMBAR SPINE - 2-3 VIEW

[lateral (1 of 2)]
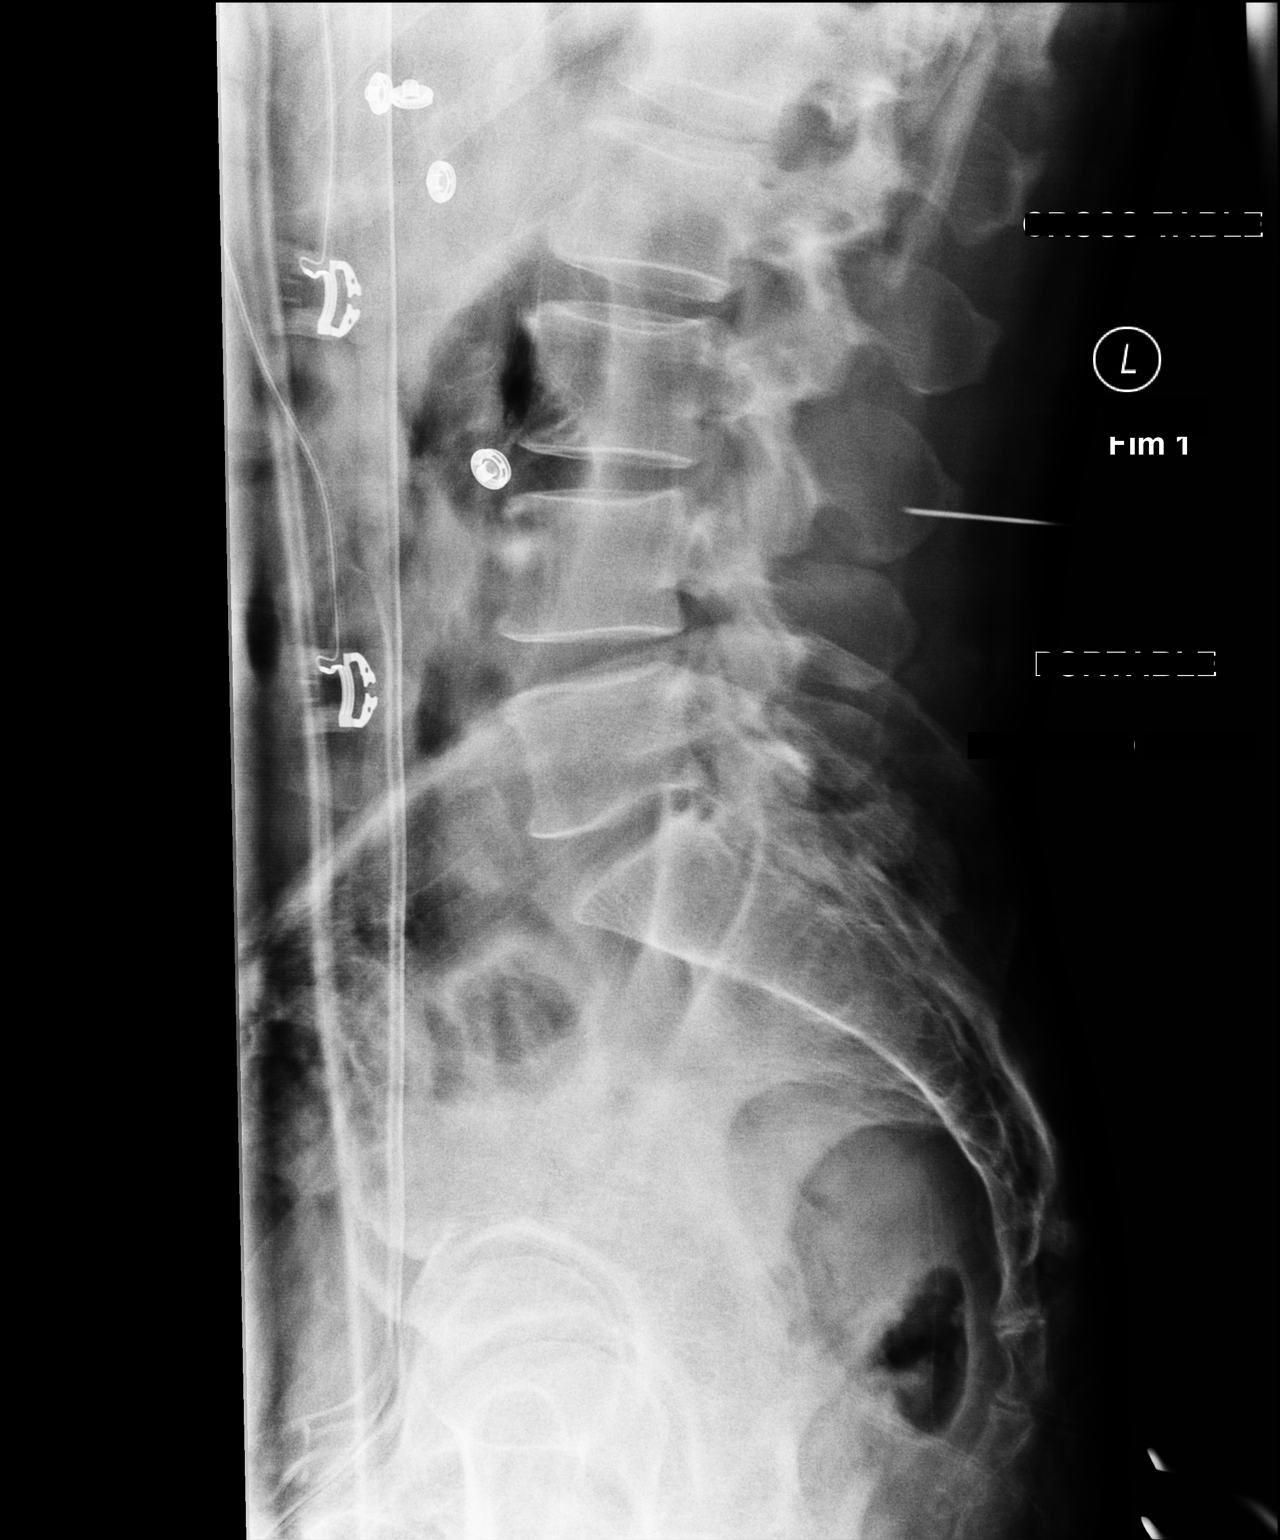

[lateral (2 of 2)]
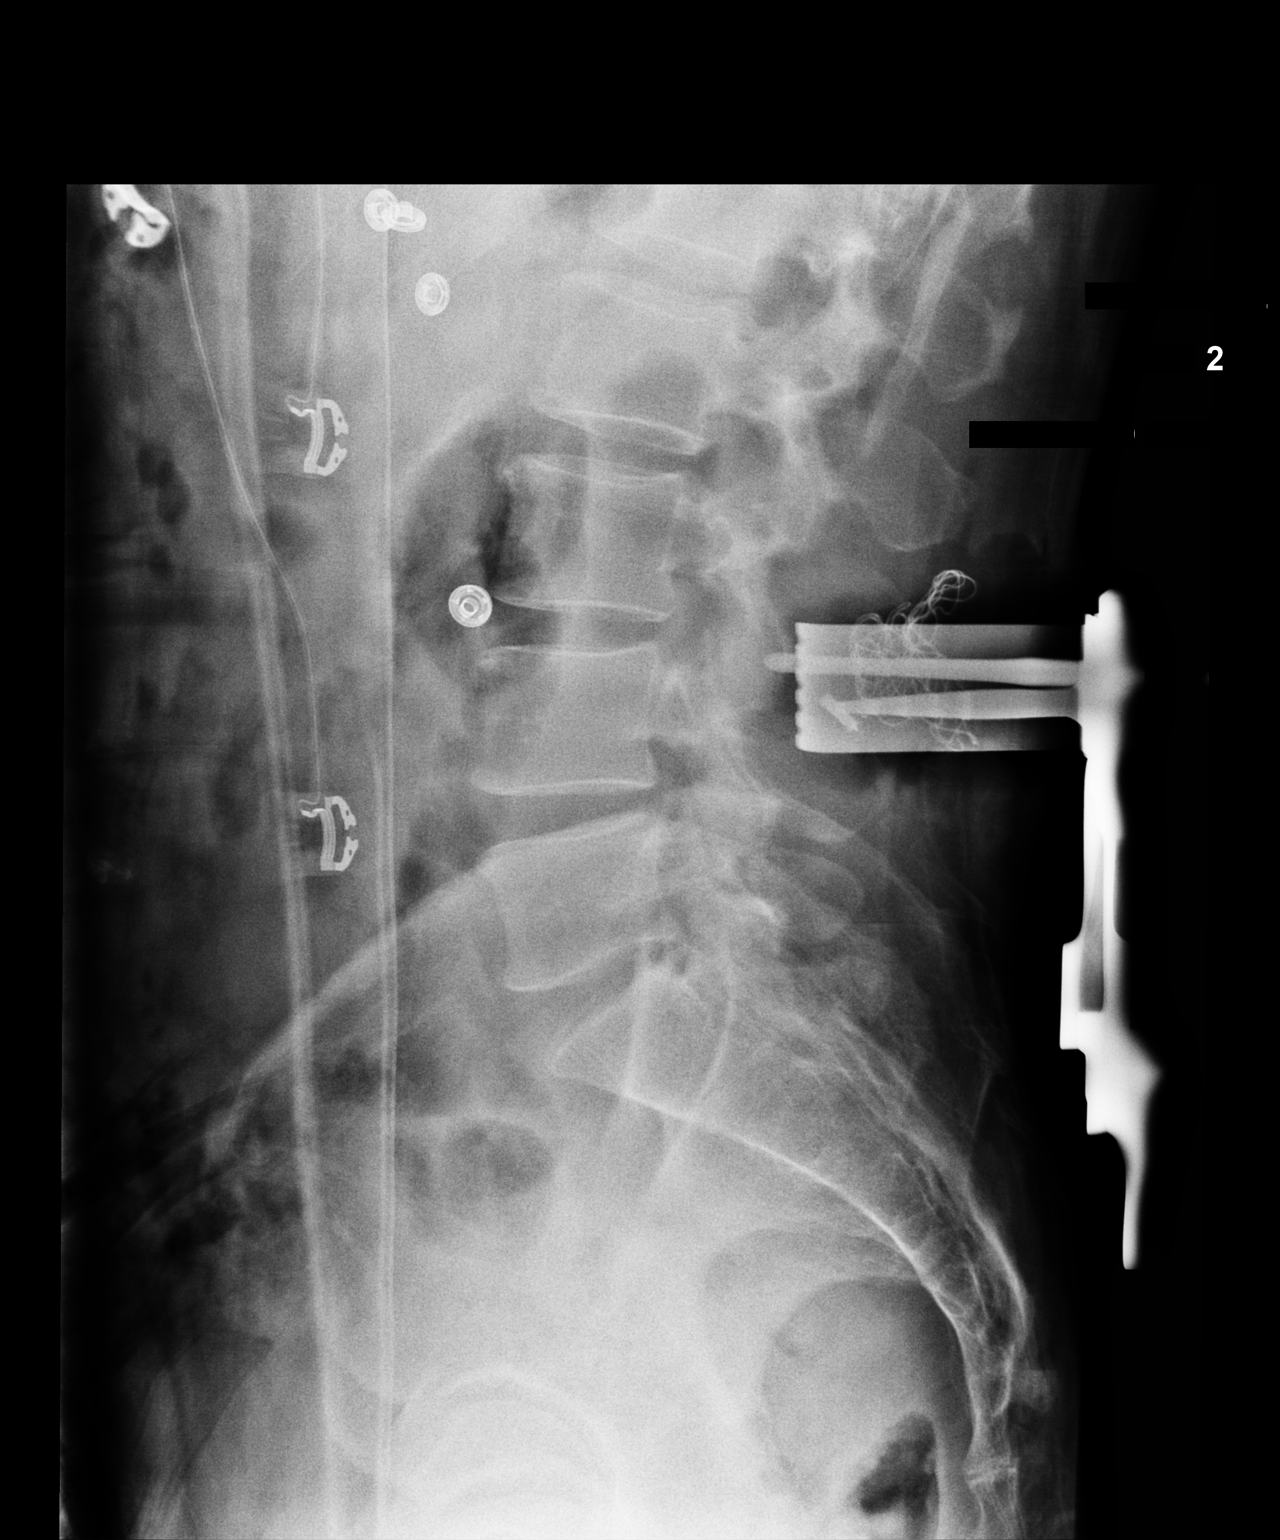

[2 of 2 positions shown; findings below may reference images not displayed]

FINDINGS: Intraoperative localization with needle projecting at the L3 spinous
process. The needle is in line with the L3-4 disc space. A retractor
was then placed.
IMPRESSION: Intraoperative localization at L3-4.

## 2019-12-22 DIAGNOSIS — L700 Acne vulgaris: Secondary | ICD-10-CM | POA: Diagnosis not present

## 2019-12-22 DIAGNOSIS — Z79899 Other long term (current) drug therapy: Secondary | ICD-10-CM | POA: Diagnosis not present

## 2019-12-22 DIAGNOSIS — B359 Dermatophytosis, unspecified: Secondary | ICD-10-CM | POA: Diagnosis not present

## 2020-01-18 DIAGNOSIS — Z79899 Other long term (current) drug therapy: Secondary | ICD-10-CM | POA: Diagnosis not present

## 2020-01-18 DIAGNOSIS — Z125 Encounter for screening for malignant neoplasm of prostate: Secondary | ICD-10-CM | POA: Diagnosis not present

## 2020-01-18 DIAGNOSIS — Z0000 Encounter for general adult medical examination without abnormal findings: Secondary | ICD-10-CM | POA: Diagnosis not present

## 2020-01-19 DIAGNOSIS — R82998 Other abnormal findings in urine: Secondary | ICD-10-CM | POA: Diagnosis not present

## 2020-01-25 DIAGNOSIS — E673 Hypervitaminosis D: Secondary | ICD-10-CM | POA: Diagnosis not present

## 2020-01-25 DIAGNOSIS — Z1331 Encounter for screening for depression: Secondary | ICD-10-CM | POA: Diagnosis not present

## 2020-01-25 DIAGNOSIS — Z0000 Encounter for general adult medical examination without abnormal findings: Secondary | ICD-10-CM | POA: Diagnosis not present

## 2020-01-25 DIAGNOSIS — M8580 Other specified disorders of bone density and structure, unspecified site: Secondary | ICD-10-CM | POA: Diagnosis not present

## 2020-01-25 DIAGNOSIS — R413 Other amnesia: Secondary | ICD-10-CM | POA: Diagnosis not present

## 2020-01-25 DIAGNOSIS — L709 Acne, unspecified: Secondary | ICD-10-CM | POA: Diagnosis not present

## 2020-01-26 DIAGNOSIS — Z1212 Encounter for screening for malignant neoplasm of rectum: Secondary | ICD-10-CM | POA: Diagnosis not present

## 2020-01-28 DIAGNOSIS — L723 Sebaceous cyst: Secondary | ICD-10-CM | POA: Diagnosis not present

## 2020-01-28 DIAGNOSIS — L300 Nummular dermatitis: Secondary | ICD-10-CM | POA: Diagnosis not present

## 2020-01-28 DIAGNOSIS — L700 Acne vulgaris: Secondary | ICD-10-CM | POA: Diagnosis not present

## 2020-02-08 DIAGNOSIS — G35 Multiple sclerosis: Secondary | ICD-10-CM | POA: Diagnosis not present

## 2020-02-10 DIAGNOSIS — M859 Disorder of bone density and structure, unspecified: Secondary | ICD-10-CM | POA: Diagnosis not present

## 2020-02-24 DIAGNOSIS — G35 Multiple sclerosis: Secondary | ICD-10-CM | POA: Diagnosis not present

## 2020-02-24 DIAGNOSIS — Z79899 Other long term (current) drug therapy: Secondary | ICD-10-CM | POA: Diagnosis not present

## 2020-02-28 DIAGNOSIS — Z79899 Other long term (current) drug therapy: Secondary | ICD-10-CM | POA: Diagnosis not present

## 2020-02-28 DIAGNOSIS — L309 Dermatitis, unspecified: Secondary | ICD-10-CM | POA: Diagnosis not present

## 2020-02-28 DIAGNOSIS — L723 Sebaceous cyst: Secondary | ICD-10-CM | POA: Diagnosis not present

## 2020-02-28 DIAGNOSIS — L700 Acne vulgaris: Secondary | ICD-10-CM | POA: Diagnosis not present

## 2020-03-07 DIAGNOSIS — N3943 Post-void dribbling: Secondary | ICD-10-CM | POA: Diagnosis not present

## 2020-03-07 DIAGNOSIS — R8290 Unspecified abnormal findings in urine: Secondary | ICD-10-CM | POA: Diagnosis not present

## 2020-03-07 DIAGNOSIS — N451 Epididymitis: Secondary | ICD-10-CM | POA: Diagnosis not present

## 2020-03-07 DIAGNOSIS — N319 Neuromuscular dysfunction of bladder, unspecified: Secondary | ICD-10-CM | POA: Diagnosis not present

## 2020-03-07 DIAGNOSIS — N401 Enlarged prostate with lower urinary tract symptoms: Secondary | ICD-10-CM | POA: Diagnosis not present

## 2020-03-29 DIAGNOSIS — L723 Sebaceous cyst: Secondary | ICD-10-CM | POA: Diagnosis not present

## 2020-03-29 DIAGNOSIS — D0462 Carcinoma in situ of skin of left upper limb, including shoulder: Secondary | ICD-10-CM | POA: Diagnosis not present

## 2020-03-29 DIAGNOSIS — Z79899 Other long term (current) drug therapy: Secondary | ICD-10-CM | POA: Diagnosis not present

## 2020-03-29 DIAGNOSIS — L300 Nummular dermatitis: Secondary | ICD-10-CM | POA: Diagnosis not present

## 2020-03-29 DIAGNOSIS — L700 Acne vulgaris: Secondary | ICD-10-CM | POA: Diagnosis not present

## 2020-03-29 DIAGNOSIS — D485 Neoplasm of uncertain behavior of skin: Secondary | ICD-10-CM | POA: Diagnosis not present

## 2020-04-12 DIAGNOSIS — D0462 Carcinoma in situ of skin of left upper limb, including shoulder: Secondary | ICD-10-CM | POA: Diagnosis not present

## 2020-05-03 DIAGNOSIS — L309 Dermatitis, unspecified: Secondary | ICD-10-CM | POA: Diagnosis not present

## 2020-05-03 DIAGNOSIS — L700 Acne vulgaris: Secondary | ICD-10-CM | POA: Diagnosis not present

## 2020-05-03 DIAGNOSIS — Z79899 Other long term (current) drug therapy: Secondary | ICD-10-CM | POA: Diagnosis not present

## 2020-05-03 DIAGNOSIS — L723 Sebaceous cyst: Secondary | ICD-10-CM | POA: Diagnosis not present

## 2020-05-15 DIAGNOSIS — G35 Multiple sclerosis: Secondary | ICD-10-CM | POA: Diagnosis not present

## 2020-06-07 DIAGNOSIS — Z79899 Other long term (current) drug therapy: Secondary | ICD-10-CM | POA: Diagnosis not present

## 2020-06-07 DIAGNOSIS — L309 Dermatitis, unspecified: Secondary | ICD-10-CM | POA: Diagnosis not present

## 2020-06-07 DIAGNOSIS — L700 Acne vulgaris: Secondary | ICD-10-CM | POA: Diagnosis not present

## 2020-06-07 DIAGNOSIS — L723 Sebaceous cyst: Secondary | ICD-10-CM | POA: Diagnosis not present

## 2020-06-29 DIAGNOSIS — G35 Multiple sclerosis: Secondary | ICD-10-CM | POA: Diagnosis not present

## 2020-06-29 DIAGNOSIS — N5201 Erectile dysfunction due to arterial insufficiency: Secondary | ICD-10-CM | POA: Diagnosis not present

## 2020-06-29 DIAGNOSIS — N319 Neuromuscular dysfunction of bladder, unspecified: Secondary | ICD-10-CM | POA: Diagnosis not present

## 2020-06-29 DIAGNOSIS — N401 Enlarged prostate with lower urinary tract symptoms: Secondary | ICD-10-CM | POA: Diagnosis not present

## 2020-07-12 DIAGNOSIS — L723 Sebaceous cyst: Secondary | ICD-10-CM | POA: Diagnosis not present

## 2020-07-12 DIAGNOSIS — L309 Dermatitis, unspecified: Secondary | ICD-10-CM | POA: Diagnosis not present

## 2020-07-12 DIAGNOSIS — Z79899 Other long term (current) drug therapy: Secondary | ICD-10-CM | POA: Diagnosis not present

## 2020-07-12 DIAGNOSIS — L700 Acne vulgaris: Secondary | ICD-10-CM | POA: Diagnosis not present

## 2020-07-24 DIAGNOSIS — G35 Multiple sclerosis: Secondary | ICD-10-CM | POA: Diagnosis not present

## 2020-07-24 DIAGNOSIS — R7301 Impaired fasting glucose: Secondary | ICD-10-CM | POA: Diagnosis not present

## 2020-08-07 DIAGNOSIS — N319 Neuromuscular dysfunction of bladder, unspecified: Secondary | ICD-10-CM | POA: Diagnosis not present

## 2020-08-07 DIAGNOSIS — N521 Erectile dysfunction due to diseases classified elsewhere: Secondary | ICD-10-CM | POA: Diagnosis not present

## 2020-08-07 DIAGNOSIS — N528 Other male erectile dysfunction: Secondary | ICD-10-CM | POA: Diagnosis not present

## 2020-08-07 DIAGNOSIS — N5201 Erectile dysfunction due to arterial insufficiency: Secondary | ICD-10-CM | POA: Diagnosis not present

## 2020-08-07 DIAGNOSIS — G35 Multiple sclerosis: Secondary | ICD-10-CM | POA: Diagnosis not present

## 2020-08-07 DIAGNOSIS — Z981 Arthrodesis status: Secondary | ICD-10-CM | POA: Diagnosis not present

## 2020-08-07 DIAGNOSIS — N400 Enlarged prostate without lower urinary tract symptoms: Secondary | ICD-10-CM | POA: Diagnosis not present

## 2020-08-08 DIAGNOSIS — N5201 Erectile dysfunction due to arterial insufficiency: Secondary | ICD-10-CM | POA: Diagnosis not present

## 2020-08-30 DIAGNOSIS — G35 Multiple sclerosis: Secondary | ICD-10-CM | POA: Diagnosis not present

## 2020-08-30 DIAGNOSIS — G4701 Insomnia due to medical condition: Secondary | ICD-10-CM | POA: Diagnosis not present

## 2020-08-30 DIAGNOSIS — R4189 Other symptoms and signs involving cognitive functions and awareness: Secondary | ICD-10-CM | POA: Diagnosis not present

## 2020-09-09 DIAGNOSIS — Z23 Encounter for immunization: Secondary | ICD-10-CM | POA: Diagnosis not present

## 2020-10-26 DIAGNOSIS — L739 Follicular disorder, unspecified: Secondary | ICD-10-CM | POA: Diagnosis not present

## 2020-10-26 DIAGNOSIS — L700 Acne vulgaris: Secondary | ICD-10-CM | POA: Diagnosis not present

## 2020-10-26 DIAGNOSIS — D0462 Carcinoma in situ of skin of left upper limb, including shoulder: Secondary | ICD-10-CM | POA: Diagnosis not present

## 2020-10-26 DIAGNOSIS — L300 Nummular dermatitis: Secondary | ICD-10-CM | POA: Diagnosis not present

## 2020-11-16 DIAGNOSIS — Z981 Arthrodesis status: Secondary | ICD-10-CM | POA: Diagnosis not present

## 2020-11-16 DIAGNOSIS — M4312 Spondylolisthesis, cervical region: Secondary | ICD-10-CM | POA: Diagnosis not present

## 2020-11-16 DIAGNOSIS — G35 Multiple sclerosis: Secondary | ICD-10-CM | POA: Diagnosis not present

## 2020-11-16 DIAGNOSIS — M4802 Spinal stenosis, cervical region: Secondary | ICD-10-CM | POA: Diagnosis not present

## 2020-11-22 DIAGNOSIS — G35 Multiple sclerosis: Secondary | ICD-10-CM | POA: Diagnosis not present

## 2020-11-22 DIAGNOSIS — R4189 Other symptoms and signs involving cognitive functions and awareness: Secondary | ICD-10-CM | POA: Diagnosis not present

## 2020-11-22 DIAGNOSIS — G4700 Insomnia, unspecified: Secondary | ICD-10-CM | POA: Diagnosis not present

## 2020-12-06 DIAGNOSIS — Z1152 Encounter for screening for COVID-19: Secondary | ICD-10-CM | POA: Diagnosis not present

## 2020-12-19 DIAGNOSIS — R5383 Other fatigue: Secondary | ICD-10-CM | POA: Diagnosis not present

## 2020-12-19 DIAGNOSIS — E785 Hyperlipidemia, unspecified: Secondary | ICD-10-CM | POA: Diagnosis not present

## 2020-12-20 DIAGNOSIS — R202 Paresthesia of skin: Secondary | ICD-10-CM | POA: Diagnosis not present

## 2020-12-28 DIAGNOSIS — L821 Other seborrheic keratosis: Secondary | ICD-10-CM | POA: Diagnosis not present

## 2020-12-28 DIAGNOSIS — L578 Other skin changes due to chronic exposure to nonionizing radiation: Secondary | ICD-10-CM | POA: Diagnosis not present

## 2020-12-28 DIAGNOSIS — L814 Other melanin hyperpigmentation: Secondary | ICD-10-CM | POA: Diagnosis not present

## 2020-12-28 DIAGNOSIS — Z86007 Personal history of in-situ neoplasm of skin: Secondary | ICD-10-CM | POA: Diagnosis not present

## 2021-01-02 DIAGNOSIS — F5104 Psychophysiologic insomnia: Secondary | ICD-10-CM | POA: Diagnosis not present

## 2021-01-10 DIAGNOSIS — B360 Pityriasis versicolor: Secondary | ICD-10-CM | POA: Diagnosis not present

## 2021-01-30 DIAGNOSIS — Z125 Encounter for screening for malignant neoplasm of prostate: Secondary | ICD-10-CM | POA: Diagnosis not present

## 2021-01-30 DIAGNOSIS — R7301 Impaired fasting glucose: Secondary | ICD-10-CM | POA: Diagnosis not present

## 2021-01-30 DIAGNOSIS — M8589 Other specified disorders of bone density and structure, multiple sites: Secondary | ICD-10-CM | POA: Diagnosis not present

## 2021-01-30 DIAGNOSIS — E785 Hyperlipidemia, unspecified: Secondary | ICD-10-CM | POA: Diagnosis not present

## 2021-01-30 DIAGNOSIS — M859 Disorder of bone density and structure, unspecified: Secondary | ICD-10-CM | POA: Diagnosis not present

## 2021-02-06 DIAGNOSIS — Z0000 Encounter for general adult medical examination without abnormal findings: Secondary | ICD-10-CM | POA: Diagnosis not present

## 2021-02-06 DIAGNOSIS — E785 Hyperlipidemia, unspecified: Secondary | ICD-10-CM | POA: Diagnosis not present

## 2021-02-06 DIAGNOSIS — K921 Melena: Secondary | ICD-10-CM | POA: Diagnosis not present

## 2021-02-06 DIAGNOSIS — R82998 Other abnormal findings in urine: Secondary | ICD-10-CM | POA: Diagnosis not present

## 2021-02-06 DIAGNOSIS — Z23 Encounter for immunization: Secondary | ICD-10-CM | POA: Diagnosis not present

## 2021-03-15 ENCOUNTER — Ambulatory Visit (INDEPENDENT_AMBULATORY_CARE_PROVIDER_SITE_OTHER): Payer: BC Managed Care – PPO | Admitting: Physician Assistant

## 2021-03-15 ENCOUNTER — Encounter: Payer: Self-pay | Admitting: Physician Assistant

## 2021-03-15 ENCOUNTER — Telehealth: Payer: Self-pay

## 2021-03-15 ENCOUNTER — Other Ambulatory Visit: Payer: Self-pay

## 2021-03-15 VITALS — BP 94/60 | HR 76 | Ht 71.5 in | Wt 177.0 lb

## 2021-03-15 DIAGNOSIS — K625 Hemorrhage of anus and rectum: Secondary | ICD-10-CM

## 2021-03-15 DIAGNOSIS — R195 Other fecal abnormalities: Secondary | ICD-10-CM | POA: Diagnosis not present

## 2021-03-15 NOTE — Progress Notes (Signed)
Chief Complaint: Blood in stool  HPI:    Mr. Kijowski is a 55 year old male with a past medical history of reflux and multiple sclerosis as well as others listed below, known to Dr. Carlean Purl, who presents to clinic today with a complaint of blood in stool.    08/01/2017 colonoscopy with 2 diminutive polyps in the sigmoid and descending colon, diverticulosis and otherwise normal.  Pathology showed 1 adenoma and 1 hyperplastic.  Repeat recommended in 5 years, 2023.    Today, the patient presents to clinic and explains that his PCP Dr. Joylene Draft did a Hemoccult test during his recent annual physical and this returned positive.  He was told to follow-up with [place] in regards to this.  Does tell me that he sees occasional bright red blood on the toilet paper when wiping maybe about once a week because "the meds I am on for MS" make him have to strain for a bowel movement about once a week.  Does tell me though that this has happened in varying degrees, depending on his current medicines, since his initial diagnosis of MS years ago.    His wife is a physician who works in Building surveyor and is worried about positive Hemoccult results.    Denies fever, chills, weight loss, change in bowel habits or abdominal pain.   Past Medical History:  Diagnosis Date  . ALLERGIC RHINITIS 03/27/2010  . Fracture, ribs 12/31/2012   Non-displaced fracture of right 8th-11th ribs  . Gait disorder   . Gastroparesis   . GERD 03/27/2010  . Hx of adenomatous polyp of colon 08/12/2017  . HYPERLIPIDEMIA 03/27/2010  . Insomnia   . MULTIPLE SCLEROSIS, RELAPSING/REMITTING 03/27/2010  . Neurogenic bladder   . NICOTINE ADDICTION 05/24/2010  . Peripheral neuropathy   . Pleural effusion on right 12/31/2012   Secondary to rib fractures  . PONV (postoperative nausea and vomiting)   . Rib fracture 11/2012  . TMJ (dislocation of temporomandibular joint)     Past Surgical History:  Procedure Laterality Date  . ANKLE SURGERY    . ANTERIOR  CERVICAL DECOMP/DISCECTOMY FUSION N/A 09/02/2018   Procedure: ANTERIOR CERVICAL DECOMPRESSION/DISCECTOMY FUSION CERVICAL FIVE- CERVICAL SIX;  Surgeon: Jovita Gamma, MD;  Location: [facility];  Service: Neurosurgery;  Laterality: N/A;  . COLONOSCOPY    . FOOT SURGERY    . INCISION AND DRAINAGE OF WOUND  2015  . LUMBAR LAMINECTOMY/DECOMPRESSION MICRODISCECTOMY Left 11/25/2018   Procedure: LEFT LUMBAR THREE- LUMBAR FOUR EXTRAFORAMINAL MICRODISCECTOMY;  Surgeon: Jovita Gamma, MD;  Location: [facility];  Service: Neurosurgery;  Laterality: Left;  . PLEURAL EFFUSION DRAINAGE  01/01/2013   Procedure: DRAINAGE OF PLEURAL EFFUSION;  Surgeon: Rexene Alberts, MD;  [facility];  Service: Thoracic;  Laterality: Right;    Current Outpatient Medications  Medication Sig Dispense Refill  . acetaminophen (TYLENOL) 500 MG tablet Take 1,000 mg by mouth every 6 (six) hours as needed for moderate pain or headache.     . bethanechol (URECHOLINE) 50 MG tablet Take 50 mg by mouth 4 (four) times daily.     . Biotin 5000 MCG CAPS Take 5,000 mcg by mouth daily.     . cyclobenzaprine (FLEXERIL) 10 MG tablet Take 10 mg by mouth 2 (two) times daily.    . diclofenac (VOLTAREN) 75 MG EC tablet Take 75 mg by mouth 2 (two) times daily.    . diphenhydramine-acetaminophen (TYLENOL PM) 25-500 MG TABS Take 3 tablets by mouth at bedtime.     . donepezil (ARICEPT)  10 MG tablet Take 1 tablet (10 mg total) by mouth daily. 90 tablet 2  . finasteride (PROSCAR) 5 MG tablet Take 5 mg by mouth daily.   98  . fluticasone (FLONASE) 50 MCG/ACT nasal spray Place 2 sprays into both nostrils daily. USE 2 SPRAYS IN EACH NOSTRIL DAILY AS NEEDED FOR ALLERGIES. GENERIC EQUIVALENT FOR FLONASE (Patient taking differently: Place 2 sprays into both nostrils daily. ) 16 g 3  . gabapentin (NEURONTIN) 800 MG tablet TAKE 1 TABLET BY MOUTH THREE TIMES DAILY (Patient taking differently: Take 800 mg by mouth 3 (three) times daily. ) 270 tablet 0  .  L-Methylfolate-Algae-B12-B6 (METANX) 3-90.314-2-35 MG CAPS Take 2 capsules by mouth daily. 180 capsule 4  . LORazepam (ATIVAN) 2 MG tablet TAKE 1 BY MOUTH TWICE DAILY AS NEEDED FOR ANXIETY (Patient taking differently: Take 2 mg by mouth at bedtime. ) 60 tablet 0  . Misc Natural Products (FOCUSED MIND PO) Take 2 tablets by mouth daily.     . nicotine polacrilex (COMMIT) 4 MG lozenge Place 2 mg inside cheek as needed for smoking cessation.     Marland Kitchen omeprazole (PRILOSEC) 20 MG capsule Take 1 capsule (20 mg total) by mouth daily. 90 capsule 2  . OVER THE COUNTER MEDICATION Take 1 tablet by mouth daily. [employer] MEN AND GNC CALCIUM/MAGNESIUM     . OVER THE COUNTER MEDICATION Take 1 tablet by mouth daily. [place] Mega Man    . OVER THE COUNTER MEDICATION Take 2 tablets by mouth daily. [employer] Triflex     . oxyCODONE-acetaminophen (PERCOCET/ROXICET) 5-325 MG tablet Take 0.5-1 tablets by mouth every 4 (four) hours as needed (pain). 30 tablet 0  . sildenafil (VIAGRA) 100 MG tablet Take 100 mg by mouth as needed for erectile dysfunction.     . TECFIDERA 240 MG CPDR Take 240 mg by mouth 2 (two) times daily.   4  . traZODone (DESYREL) 50 MG tablet TAKE 2 BY MOUTH AT BEDTIME AS NEEDED FOR SLEEP (Patient taking differently: Take 100 mg by mouth at bedtime. ) 60 tablet 2   No current facility-administered medications for this visit.    Allergies as of 03/15/2021 - Review Complete 11/25/2018  Allergen Reaction Noted  . Morphine Swelling 03/27/2010  . Tramadol Other (See Comments) 06/17/2016  . Ampicillin Swelling and Other (See Comments) 03/27/2010  . Hydrocodone-acetaminophen Other (See Comments) 06/02/2018  . Oxcarbazepine Rash 01/06/2018  . Vancomycin Rash 11/25/2018    Family History  Problem Relation Age of Onset  . Cancer Father        lung ca  . Heart failure Mother   . Colon cancer Neg Hx   . Stomach cancer Neg Hx   . Rectal cancer Neg Hx   . Esophageal cancer Neg Hx     Social History   Socioeconomic  History  . Marital status: Married    Spouse name: Jana Half  . Number of children: 0  . Years of education: Law  . Highest education level: Not on file  Occupational History  . Occupation: Self-employed    Employer: [employer]    Comment: property management  Tobacco Use  . Smoking status: Former Smoker    Packs/day: 0.50    Years: 20.00    Pack years: 10.00    Types: Cigarettes    Quit date: 10/16/2012    Years since quitting: 8.4  . Smokeless tobacco: Former Systems developer    Quit date: 10/31/2012  Vaping Use  . Vaping Use: Every day  Substance and Sexual Activity  . Alcohol use: Yes    Comment: occasionally  . Drug use: No  . Sexual activity: Not on file  Other Topics Concern  . Not on file  Social History Narrative   Pt lives at home with spouse.    Caffeine Use: none   Social Determinants of Radio broadcast assistant Strain: Not on file  Food Insecurity: Not on file  Transportation Needs: Not on file  Physical Activity: Not on file  Stress: Not on file  Social Connections: Not on file  Intimate Partner Violence: Not on file    Review of Systems:    Constitutional: No weight loss, fever or chills Skin: No rash  Cardiovascular: No chest pain  Respiratory: No SOB  Gastrointestinal: See HPI and otherwise negative Genitourinary: No dysuria or change in urinary frequency Neurological: No headache, dizziness or syncope Musculoskeletal: No new muscle or joint pain Hematologic: No bruising Psychiatric: No history of depression or anxiety   Physical Exam:  Vital signs: BP 94/60   Pulse 76   Ht 5' 11.5" (1.816 m)   Wt 177 lb (80.3 kg)   BMI 24.34 kg/m   Constitutional:   Pleasant Caucasian male appears to be in NAD, Well developed, Well nourished, alert and cooperative Head:  Normocephalic and atraumatic. Eyes:   PEERL, EOMI. No icterus. Conjunctiva pink. Ears:  Normal auditory acuity. Neck:  Supple Throat: Oral cavity and pharynx without inflammation,  swelling or lesion.  Respiratory: Respirations even and unlabored. Lungs clear to auscultation bilaterally.   No wheezes, crackles, or rhonchi.  Cardiovascular: Normal S1, S2. No MRG. Regular rate and rhythm. No peripheral edema, cyanosis or pallor.  Gastrointestinal:  Soft, nondistended, nontender. No rebound or guarding. Normal bowel sounds. No appreciable masses or hepatomegaly. Rectal:  Declined Msk:  Symmetrical without gross deformities. Without edema, no deformity or joint abnormality.  Neurologic:  Alert and  oriented x4;  grossly normal neurologically.  Skin:   Dry and intact without significant lesions or rashes. Psychiatric: Demonstrates good judgement and reason without abnormal affect or behaviors.  Recent labs with PCP, we do not have them.  Assessment: 1.  Positive Hemoccult testing: By PCP recently, last colonoscopy 2018 with recommendations to repeat in 5 years 2.  Rectal bleeding: Occasionally after straining for a bowel movement; relates this once a week to MS meds  Plan: 1.  We will request recent Hemoccult testing. 2.  Scheduled patient for a diagnostic colonoscopy in the [place] with Dr. Carlean Purl given his positive Hemoccult results and description of rectal bleeding.  Did provide the patient with a detailed list of risks for the procedure and he agrees to proceed. 3.  Patient did request that we order a urinalysis given that he recently had some bacteria in the urinalysis from his PCP but he is having no UTI symptoms.  I told him it would be best to follow with his PCP in regards to retesting for this if he feels it is necessary. 4.  Patient to follow in clinic per recommendations from Dr. Carlean Purl after time of procedure.  Ellouise Newer, PA-C [facility] 03/15/2021, 3:34 PM  Cc: Dr. Joylene Draft

## 2021-03-15 NOTE — Patient Instructions (Signed)
If you are age 56 or older, your body mass index should be between 23-30. Your Body mass index is 24.34 kg/m. If this is out of the aforementioned range listed, please consider follow up with your Primary Care Provider.  If you are age 34 or younger, your body mass index should be between 19-25. Your Body mass index is 24.34 kg/m. If this is out of the aformentioned range listed, please consider follow up with your Primary Care Provider.   You have been scheduled for a colonoscopy. Please follow written instructions given to you at your visit today.  Please pick up your prep supplies at the pharmacy within the next 1-3 days. If you use inhalers (even only as needed), please bring them with you on the day of your procedure.  Thank you for choosing me and [facility].  Ellouise Newer, PA-C

## 2021-03-15 NOTE — Telephone Encounter (Signed)
Patient was advised that Care partner (Wife) has to be down stairs in the parking lot or upstairs waiting in the building. She can not next door where she works.

## 2021-03-20 DIAGNOSIS — R8290 Unspecified abnormal findings in urine: Secondary | ICD-10-CM | POA: Diagnosis not present

## 2021-05-16 DIAGNOSIS — R4689 Other symptoms and signs involving appearance and behavior: Secondary | ICD-10-CM | POA: Diagnosis not present

## 2021-05-16 DIAGNOSIS — Z01818 Encounter for other preprocedural examination: Secondary | ICD-10-CM | POA: Diagnosis not present

## 2021-05-16 DIAGNOSIS — R4189 Other symptoms and signs involving cognitive functions and awareness: Secondary | ICD-10-CM | POA: Diagnosis not present

## 2021-05-16 DIAGNOSIS — G35 Multiple sclerosis: Secondary | ICD-10-CM | POA: Diagnosis not present

## 2021-05-21 DIAGNOSIS — N401 Enlarged prostate with lower urinary tract symptoms: Secondary | ICD-10-CM | POA: Diagnosis not present

## 2021-05-21 DIAGNOSIS — N319 Neuromuscular dysfunction of bladder, unspecified: Secondary | ICD-10-CM | POA: Diagnosis not present

## 2021-05-21 DIAGNOSIS — N5201 Erectile dysfunction due to arterial insufficiency: Secondary | ICD-10-CM | POA: Diagnosis not present

## 2021-05-21 DIAGNOSIS — G35 Multiple sclerosis: Secondary | ICD-10-CM | POA: Diagnosis not present

## 2021-05-22 ENCOUNTER — Encounter: Payer: Self-pay | Admitting: Internal Medicine

## 2021-05-24 ENCOUNTER — Other Ambulatory Visit: Payer: Self-pay

## 2021-05-24 ENCOUNTER — Encounter: Payer: Self-pay | Admitting: Internal Medicine

## 2021-05-24 ENCOUNTER — Ambulatory Visit (AMBULATORY_SURGERY_CENTER): Payer: BC Managed Care – PPO | Admitting: Internal Medicine

## 2021-05-24 VITALS — BP 111/57 | HR 55 | Temp 98.6°F | Resp 23 | Ht 71.5 in | Wt 177.0 lb

## 2021-05-24 DIAGNOSIS — K5730 Diverticulosis of large intestine without perforation or abscess without bleeding: Secondary | ICD-10-CM | POA: Diagnosis not present

## 2021-05-24 DIAGNOSIS — D123 Benign neoplasm of transverse colon: Secondary | ICD-10-CM | POA: Diagnosis not present

## 2021-05-24 DIAGNOSIS — R195 Other fecal abnormalities: Secondary | ICD-10-CM | POA: Diagnosis not present

## 2021-05-24 DIAGNOSIS — Z86010 Personal history of colonic polyps: Secondary | ICD-10-CM

## 2021-05-24 DIAGNOSIS — K625 Hemorrhage of anus and rectum: Secondary | ICD-10-CM

## 2021-05-24 DIAGNOSIS — K6389 Other specified diseases of intestine: Secondary | ICD-10-CM | POA: Diagnosis not present

## 2021-05-24 MED ORDER — SODIUM CHLORIDE 0.9 % IV SOLN: 500.0000 mL | Freq: Once | INTRAVENOUS | Status: DC

## 2021-05-24 NOTE — Progress Notes (Signed)
Report given to PACU, vss 

## 2021-05-24 NOTE — Op Note (Signed)
[name] Procedure Date: 05/24/2021 3:27 PM MRN: [id number] Endoscopist: [name] , MD Age: 55 Referring MD:  Date of Birth: 03/19/1966 Gender: Male Account #: [id number] Procedure:                Colonoscopy Indications:              Heme positive stool Medicines:                Propofol per Anesthesia, Monitored Anesthesia Care Procedure:                Pre-Anesthesia Assessment:                           - Prior to the procedure, a History and Physical                            was performed, and patient medications and                            allergies were reviewed. The patient's tolerance of                            previous anesthesia was also reviewed. The risks                            and benefits of the procedure and the sedation                            options and risks were discussed with the patient.                            All questions were answered, and informed consent                            was obtained. Prior Anticoagulants: The patient has                            taken no previous anticoagulant or antiplatelet                            agents. ASA Grade Assessment: III - A patient with                            severe systemic disease. After reviewing the risks                            and benefits, the patient was deemed in                            satisfactory condition to undergo the procedure.                           After obtaining informed consent, the colonoscope  was passed under direct vision. Throughout the                            procedure, the patient's blood pressure, pulse, and                            oxygen saturations were monitored continuously. The                            Olympus CF-HQ190L [id number]) Colonoscope was                            introduced through the anus and advanced to the the                            terminal ileum,  with identification of the                            appendiceal orifice and IC valve. The colonoscopy                            was performed without difficulty. The patient                            tolerated the procedure well. The quality of the                            bowel preparation was good. The ileocecal valve,                            appendiceal orifice, and rectum were photographed. Scope In: 3:41:16 PM Scope Out: 3:57:10 PM Scope Withdrawal Time: 0 hours 12 minutes 3 seconds  Total Procedure Duration: 0 hours 15 minutes 54 seconds  Findings:                 The perianal and digital rectal examinations were                            normal. Pertinent negatives include normal prostate                            (size, shape, and consistency).                           A 1 mm polyp was found in the transverse colon. The                            polyp was sessile. The polyp was removed with a                            cold biopsy forceps. Resection and retrieval were                            complete. Verification of patient identification  for the specimen was done. Estimated blood loss was                            minimal.                           A localized area of ulcerated mucosa was found at                            the ileocecal valve. Biopsies were taken with a                            cold forceps for histology. Verification of patient                            identification for the specimen was done. Estimated                            blood loss was minimal.                           Multiple diverticula were found in the sigmoid                            colon, descending colon and transverse colon.                           The exam was otherwise without abnormality on                            direct and retroflexion views. Complications:            No immediate complications. Estimated Blood Loss:     Estimated  blood loss was minimal. Impression:               - One 1 mm polyp in the transverse colon, removed                            with a cold biopsy forceps. Resected and retrieved.                           - Ulcerated mucosa at the ileocecal valve. Biopsied.                           - Diverticulosis in the sigmoid colon, in the                            descending colon and in the transverse colon.                           - The examination was otherwise normal on direct                            and retroflexion views. His hemorrhoids were not  swollen but are likely source of heme + stool and                            weekly slight rectal bleeding                           - Personal history of colonic polyps. Diminutive                            adenoma 2018 Recommendation:           - Patient has a contact number available for                            emergencies. The signs and symptoms of potential                            delayed complications were discussed with the                            patient. Return to normal activities tomorrow.                            Written discharge instructions were provided to the                            patient.                           - Resume previous diet.                           - Continue present medications.                           - Await pathology results.                           - Repeat colonoscopy is recommended for                            surveillance. The colonoscopy date will be                            determined after pathology results from today's                            exam become available for review.                           - Routine repeat hemoccults are not recommended Daniel Mayer, MD 05/24/2021 4:13:37 PM This report has been signed electronically.

## 2021-05-24 NOTE — Progress Notes (Signed)
VS taken by J.K.

## 2021-05-24 NOTE — Patient Instructions (Addendum)
HANDOUTS PROVIDED:  POLYPS  There was a 1 mm polyp I removed. There were 2 small superficial ulcers in the colon where it joins the small bowel. I doubt they represent a problem and think may be irritation from the prep but I took biopsies.  I appreciate the opportunity to care for you. Gatha Mayer, MD, FACG  YOU HAD AN ENDOSCOPIC PROCEDURE TODAY AT [facility]:   Refer to the procedure report that was given to you for any specific questions about what was found during the examination.  If the procedure report does not answer your questions, please call your gastroenterologist to clarify.  If you requested that your care partner not be given the details of your procedure findings, then the procedure report has been included in a sealed envelope for you to review at your convenience later.  YOU SHOULD EXPECT: Some feelings of bloating in the abdomen. Passage of more gas than usual.  Walking can help get rid of the air that was put into your GI tract during the procedure and reduce the bloating. If you had a lower endoscopy (such as a colonoscopy or flexible sigmoidoscopy) you may notice spotting of blood in your stool or on the toilet paper. If you underwent a bowel prep for your procedure, you may not have a normal bowel movement for a few days.  Please Note:  You might notice some irritation and congestion in your nose or some drainage.  This is from the oxygen used during your procedure.  There is no need for concern and it should clear up in a day or so.  SYMPTOMS TO REPORT IMMEDIATELY:  Following lower endoscopy (colonoscopy or flexible sigmoidoscopy):  Excessive amounts of blood in the stool  Significant tenderness or worsening of abdominal pains  Swelling of the abdomen that is new, acute  Fever of 100F or higher  For urgent or emergent issues, a gastroenterologist can be reached at any hour by calling [phone]. Do not use MyChart messaging for urgent  concerns.    DIET:  We do recommend a small meal at first, but then you may proceed to your regular diet.  Drink plenty of fluids but you should avoid alcoholic beverages for 24 hours.  ACTIVITY:  You should plan to take it easy for the rest of today and you should NOT DRIVE or use heavy machinery until tomorrow (because of the sedation medicines used during the test).    FOLLOW UP: Our staff will call the number listed on your records Monday morning between 7:15 am and 8:15 am following your procedure to check on you and address any questions or concerns that you may have regarding the information given to you following your procedure. If we do not reach you, we will leave a message.  We will attempt to reach you two times.  During this call, we will ask if you have developed any symptoms of COVID 19. If you develop any symptoms (ie: fever, flu-like symptoms, shortness of breath, cough etc.) before then, please call [phone].  If you test positive for Covid 19 in the 2 weeks post procedure, please call and report this information to [place].    If any biopsies were taken you will be contacted by phone or by letter within the next 1-3 weeks.  Please call us at [phone] if you have not heard about the biopsies in 3 weeks.    SIGNATURES/CONFIDENTIALITY: You and/or your care partner have signed paperwork which  will be entered into your electronic medical record.  These signatures attest to the fact that that the information above on your After Visit Summary has been reviewed and is understood.  Full responsibility of the confidentiality of this discharge information lies with you and/or your care-partner.

## 2021-05-24 NOTE — Progress Notes (Signed)
Medical history reviewed with no changes noted. VS assessed by J.K, RN

## 2021-05-24 NOTE — Progress Notes (Signed)
Called to room to assist during endoscopic procedure.  Patient ID and intended procedure confirmed with present staff. Received instructions for my participation in the procedure from the performing physician.  

## 2021-05-30 DIAGNOSIS — M25531 Pain in right wrist: Secondary | ICD-10-CM | POA: Diagnosis not present

## 2021-05-30 DIAGNOSIS — M25532 Pain in left wrist: Secondary | ICD-10-CM | POA: Diagnosis not present

## 2021-05-30 DIAGNOSIS — R29898 Other symptoms and signs involving the musculoskeletal system: Secondary | ICD-10-CM | POA: Diagnosis not present

## 2021-06-05 ENCOUNTER — Encounter: Payer: Self-pay | Admitting: Internal Medicine

## 2021-06-19 DIAGNOSIS — H6983 Other specified disorders of Eustachian tube, bilateral: Secondary | ICD-10-CM | POA: Diagnosis not present

## 2021-06-19 DIAGNOSIS — J309 Allergic rhinitis, unspecified: Secondary | ICD-10-CM | POA: Diagnosis not present

## 2021-07-25 ENCOUNTER — Other Ambulatory Visit (HOSPITAL_COMMUNITY): Payer: Self-pay

## 2021-07-25 ENCOUNTER — Telehealth: Payer: Self-pay

## 2021-07-25 DIAGNOSIS — G35 Multiple sclerosis: Secondary | ICD-10-CM

## 2021-07-25 NOTE — Telephone Encounter (Signed)
Called, to schedule an appointment for Evusheld. No answer, voicemail left for patient to call back. Thanks!

## 2021-08-01 ENCOUNTER — Other Ambulatory Visit: Payer: Self-pay

## 2021-08-02 ENCOUNTER — Other Ambulatory Visit: Payer: Self-pay

## 2021-08-02 ENCOUNTER — Ambulatory Visit (INDEPENDENT_AMBULATORY_CARE_PROVIDER_SITE_OTHER): Payer: BC Managed Care – PPO

## 2021-08-02 DIAGNOSIS — G35 Multiple sclerosis: Secondary | ICD-10-CM

## 2021-08-02 MED ORDER — DIPHENHYDRAMINE HCL 50 MG/ML IJ SOLN: 50.0000 mg | Freq: Once | INTRAMUSCULAR | Status: AC | PRN

## 2021-08-02 MED ORDER — ALBUTEROL SULFATE HFA 108 (90 BASE) MCG/ACT IN AERS: 2.0000 | INHALATION_SPRAY | Freq: Once | RESPIRATORY_TRACT | Status: AC | PRN

## 2021-08-02 MED ORDER — TIXAGEVIMAB (PART OF EVUSHELD) INJECTION
300.0000 mg | Freq: Once | INTRAMUSCULAR | Status: AC
  Administered 2021-08-02: 300 mg via INTRAMUSCULAR
  Filled 2021-08-02: qty 3

## 2021-08-02 MED ORDER — EPINEPHRINE 0.3 MG/0.3ML IJ SOAJ: 0.3000 mg | Freq: Once | INTRAMUSCULAR | Status: AC | PRN

## 2021-08-02 MED ORDER — METHYLPREDNISOLONE SODIUM SUCC 125 MG IJ SOLR: 125.0000 mg | Freq: Once | INTRAMUSCULAR | Status: AC | PRN

## 2021-08-02 MED ORDER — CILGAVIMAB (PART OF EVUSHELD) INJECTION
300.0000 mg | Freq: Once | INTRAMUSCULAR | Status: AC
  Administered 2021-08-02: 300 mg via INTRAMUSCULAR
  Filled 2021-08-02: qty 3

## 2021-08-02 NOTE — Patient Instructions (Signed)

## 2021-08-02 NOTE — Progress Notes (Signed)
Diagnosis: Pre-COVID Exposure Prophylaxis  Provider:  Marshell Garfinkel, MD  Procedure: Injection  Evusheld, Dose: '600mg'$  , Site: intramuscular  Discharge: Condition: Good, Destination: Home . AVS provided to patient.   Performed by:  Paul Dykes, RN

## 2021-08-07 DIAGNOSIS — R4189 Other symptoms and signs involving cognitive functions and awareness: Secondary | ICD-10-CM | POA: Diagnosis not present

## 2021-08-07 DIAGNOSIS — M5489 Other dorsalgia: Secondary | ICD-10-CM | POA: Diagnosis not present

## 2021-08-07 DIAGNOSIS — R4689 Other symptoms and signs involving appearance and behavior: Secondary | ICD-10-CM | POA: Diagnosis not present

## 2021-08-07 DIAGNOSIS — Z9889 Other specified postprocedural states: Secondary | ICD-10-CM | POA: Diagnosis not present

## 2021-08-14 DIAGNOSIS — G4730 Sleep apnea, unspecified: Secondary | ICD-10-CM | POA: Diagnosis not present

## 2021-09-05 DIAGNOSIS — J309 Allergic rhinitis, unspecified: Secondary | ICD-10-CM | POA: Diagnosis not present

## 2021-09-05 DIAGNOSIS — J300 Vasomotor rhinitis: Secondary | ICD-10-CM | POA: Diagnosis not present

## 2021-09-29 DIAGNOSIS — Z23 Encounter for immunization: Secondary | ICD-10-CM | POA: Diagnosis not present

## 2021-11-21 DIAGNOSIS — G35 Multiple sclerosis: Secondary | ICD-10-CM | POA: Diagnosis not present

## 2021-12-26 DIAGNOSIS — G3184 Mild cognitive impairment, so stated: Secondary | ICD-10-CM | POA: Diagnosis not present

## 2022-01-31 DIAGNOSIS — M25532 Pain in left wrist: Secondary | ICD-10-CM | POA: Diagnosis not present

## 2022-01-31 DIAGNOSIS — M79645 Pain in left finger(s): Secondary | ICD-10-CM | POA: Diagnosis not present

## 2022-01-31 DIAGNOSIS — M25522 Pain in left elbow: Secondary | ICD-10-CM | POA: Diagnosis not present

## 2022-01-31 DIAGNOSIS — M25531 Pain in right wrist: Secondary | ICD-10-CM | POA: Diagnosis not present

## 2022-01-31 DIAGNOSIS — R29898 Other symptoms and signs involving the musculoskeletal system: Secondary | ICD-10-CM | POA: Diagnosis not present

## 2022-02-05 DIAGNOSIS — B360 Pityriasis versicolor: Secondary | ICD-10-CM | POA: Diagnosis not present

## 2022-02-05 DIAGNOSIS — L738 Other specified follicular disorders: Secondary | ICD-10-CM | POA: Diagnosis not present

## 2022-02-13 DIAGNOSIS — H2513 Age-related nuclear cataract, bilateral: Secondary | ICD-10-CM | POA: Diagnosis not present

## 2022-02-26 DIAGNOSIS — Z125 Encounter for screening for malignant neoplasm of prostate: Secondary | ICD-10-CM | POA: Diagnosis not present

## 2022-02-26 DIAGNOSIS — M859 Disorder of bone density and structure, unspecified: Secondary | ICD-10-CM | POA: Diagnosis not present

## 2022-02-26 DIAGNOSIS — E785 Hyperlipidemia, unspecified: Secondary | ICD-10-CM | POA: Diagnosis not present

## 2022-02-26 DIAGNOSIS — R7301 Impaired fasting glucose: Secondary | ICD-10-CM | POA: Diagnosis not present

## 2022-03-05 DIAGNOSIS — Z1339 Encounter for screening examination for other mental health and behavioral disorders: Secondary | ICD-10-CM | POA: Diagnosis not present

## 2022-03-05 DIAGNOSIS — E785 Hyperlipidemia, unspecified: Secondary | ICD-10-CM | POA: Diagnosis not present

## 2022-03-05 DIAGNOSIS — Z0000 Encounter for general adult medical examination without abnormal findings: Secondary | ICD-10-CM | POA: Diagnosis not present

## 2022-03-05 DIAGNOSIS — Z1331 Encounter for screening for depression: Secondary | ICD-10-CM | POA: Diagnosis not present

## 2022-03-05 DIAGNOSIS — Z23 Encounter for immunization: Secondary | ICD-10-CM | POA: Diagnosis not present

## 2022-03-06 ENCOUNTER — Other Ambulatory Visit: Payer: Self-pay | Admitting: Internal Medicine

## 2022-03-06 DIAGNOSIS — E785 Hyperlipidemia, unspecified: Secondary | ICD-10-CM

## 2022-03-22 DIAGNOSIS — J300 Vasomotor rhinitis: Secondary | ICD-10-CM | POA: Diagnosis not present

## 2022-03-27 DIAGNOSIS — J300 Vasomotor rhinitis: Secondary | ICD-10-CM | POA: Diagnosis not present

## 2022-03-30 DIAGNOSIS — J301 Allergic rhinitis due to pollen: Secondary | ICD-10-CM | POA: Diagnosis not present

## 2022-04-01 DIAGNOSIS — J3081 Allergic rhinitis due to animal (cat) (dog) hair and dander: Secondary | ICD-10-CM | POA: Diagnosis not present

## 2022-04-01 DIAGNOSIS — J3089 Other allergic rhinitis: Secondary | ICD-10-CM | POA: Diagnosis not present

## 2022-04-17 DIAGNOSIS — R4586 Emotional lability: Secondary | ICD-10-CM | POA: Diagnosis not present

## 2022-04-17 DIAGNOSIS — G35 Multiple sclerosis: Secondary | ICD-10-CM | POA: Diagnosis not present

## 2022-04-18 ENCOUNTER — Ambulatory Visit
Admission: RE | Admit: 2022-04-18 | Discharge: 2022-04-18 | Disposition: A | Discharge status: Home / Self Care | Payer: No Typology Code available for payment source | Source: Ambulatory Visit | Attending: Internal Medicine | Admitting: Internal Medicine

## 2022-04-18 DIAGNOSIS — E785 Hyperlipidemia, unspecified: Secondary | ICD-10-CM

## 2022-04-24 DIAGNOSIS — J3089 Other allergic rhinitis: Secondary | ICD-10-CM | POA: Diagnosis not present

## 2022-04-24 DIAGNOSIS — J3081 Allergic rhinitis due to animal (cat) (dog) hair and dander: Secondary | ICD-10-CM | POA: Diagnosis not present

## 2022-04-24 DIAGNOSIS — J301 Allergic rhinitis due to pollen: Secondary | ICD-10-CM | POA: Diagnosis not present

## 2022-05-01 ENCOUNTER — Other Ambulatory Visit: Payer: Self-pay | Admitting: Internal Medicine

## 2022-05-01 ENCOUNTER — Other Ambulatory Visit (HOSPITAL_COMMUNITY): Payer: Self-pay | Admitting: Internal Medicine

## 2022-05-01 DIAGNOSIS — R911 Solitary pulmonary nodule: Secondary | ICD-10-CM

## 2022-05-02 DIAGNOSIS — J3089 Other allergic rhinitis: Secondary | ICD-10-CM | POA: Diagnosis not present

## 2022-05-02 DIAGNOSIS — J301 Allergic rhinitis due to pollen: Secondary | ICD-10-CM | POA: Diagnosis not present

## 2022-05-02 DIAGNOSIS — J3081 Allergic rhinitis due to animal (cat) (dog) hair and dander: Secondary | ICD-10-CM | POA: Diagnosis not present

## 2022-05-07 DIAGNOSIS — J3081 Allergic rhinitis due to animal (cat) (dog) hair and dander: Secondary | ICD-10-CM | POA: Diagnosis not present

## 2022-05-07 DIAGNOSIS — J301 Allergic rhinitis due to pollen: Secondary | ICD-10-CM | POA: Diagnosis not present

## 2022-05-07 DIAGNOSIS — J3089 Other allergic rhinitis: Secondary | ICD-10-CM | POA: Diagnosis not present

## 2022-05-08 ENCOUNTER — Encounter (HOSPITAL_COMMUNITY)
Admission: RE | Admit: 2022-05-08 | Discharge: 2022-05-08 | Disposition: A | Discharge status: Home / Self Care | Payer: BC Managed Care – PPO | Source: Ambulatory Visit | Attending: Internal Medicine | Admitting: Internal Medicine

## 2022-05-08 DIAGNOSIS — R911 Solitary pulmonary nodule: Secondary | ICD-10-CM | POA: Insufficient documentation

## 2022-05-08 DIAGNOSIS — Z981 Arthrodesis status: Secondary | ICD-10-CM | POA: Diagnosis not present

## 2022-05-08 DIAGNOSIS — I700 Atherosclerosis of aorta: Secondary | ICD-10-CM | POA: Diagnosis not present

## 2022-05-08 DIAGNOSIS — S2241XA Multiple fractures of ribs, right side, initial encounter for closed fracture: Secondary | ICD-10-CM | POA: Diagnosis not present

## 2022-05-08 LAB — GLUCOSE, CAPILLARY: Glucose-Capillary: 115 mg/dL — ABNORMAL HIGH (ref 70–99)

## 2022-05-08 MED ORDER — FLUDEOXYGLUCOSE F - 18 (FDG) INJECTION
8.8000 | Freq: Once | INTRAVENOUS | Status: AC | PRN
  Administered 2022-05-08: 8.42 via INTRAVENOUS

## 2022-05-09 DIAGNOSIS — J3089 Other allergic rhinitis: Secondary | ICD-10-CM | POA: Diagnosis not present

## 2022-05-09 DIAGNOSIS — J3081 Allergic rhinitis due to animal (cat) (dog) hair and dander: Secondary | ICD-10-CM | POA: Diagnosis not present

## 2022-05-09 DIAGNOSIS — J301 Allergic rhinitis due to pollen: Secondary | ICD-10-CM | POA: Diagnosis not present

## 2022-05-13 DIAGNOSIS — J3089 Other allergic rhinitis: Secondary | ICD-10-CM | POA: Diagnosis not present

## 2022-05-13 DIAGNOSIS — J3081 Allergic rhinitis due to animal (cat) (dog) hair and dander: Secondary | ICD-10-CM | POA: Diagnosis not present

## 2022-05-13 DIAGNOSIS — J301 Allergic rhinitis due to pollen: Secondary | ICD-10-CM | POA: Diagnosis not present

## 2022-05-22 DIAGNOSIS — J3089 Other allergic rhinitis: Secondary | ICD-10-CM | POA: Diagnosis not present

## 2022-05-22 DIAGNOSIS — J301 Allergic rhinitis due to pollen: Secondary | ICD-10-CM | POA: Diagnosis not present

## 2022-05-22 DIAGNOSIS — J3081 Allergic rhinitis due to animal (cat) (dog) hair and dander: Secondary | ICD-10-CM | POA: Diagnosis not present

## 2022-05-24 DIAGNOSIS — J301 Allergic rhinitis due to pollen: Secondary | ICD-10-CM | POA: Diagnosis not present

## 2022-05-24 DIAGNOSIS — J3089 Other allergic rhinitis: Secondary | ICD-10-CM | POA: Diagnosis not present

## 2022-05-24 DIAGNOSIS — J3081 Allergic rhinitis due to animal (cat) (dog) hair and dander: Secondary | ICD-10-CM | POA: Diagnosis not present

## 2022-05-30 DIAGNOSIS — J3081 Allergic rhinitis due to animal (cat) (dog) hair and dander: Secondary | ICD-10-CM | POA: Diagnosis not present

## 2022-05-30 DIAGNOSIS — J301 Allergic rhinitis due to pollen: Secondary | ICD-10-CM | POA: Diagnosis not present

## 2022-05-30 DIAGNOSIS — J3089 Other allergic rhinitis: Secondary | ICD-10-CM | POA: Diagnosis not present

## 2022-06-04 DIAGNOSIS — J3081 Allergic rhinitis due to animal (cat) (dog) hair and dander: Secondary | ICD-10-CM | POA: Diagnosis not present

## 2022-06-04 DIAGNOSIS — J301 Allergic rhinitis due to pollen: Secondary | ICD-10-CM | POA: Diagnosis not present

## 2022-06-04 DIAGNOSIS — J3089 Other allergic rhinitis: Secondary | ICD-10-CM | POA: Diagnosis not present

## 2022-06-07 DIAGNOSIS — J3089 Other allergic rhinitis: Secondary | ICD-10-CM | POA: Diagnosis not present

## 2022-06-07 DIAGNOSIS — J301 Allergic rhinitis due to pollen: Secondary | ICD-10-CM | POA: Diagnosis not present

## 2022-06-07 DIAGNOSIS — J3081 Allergic rhinitis due to animal (cat) (dog) hair and dander: Secondary | ICD-10-CM | POA: Diagnosis not present

## 2022-06-14 DIAGNOSIS — J3081 Allergic rhinitis due to animal (cat) (dog) hair and dander: Secondary | ICD-10-CM | POA: Diagnosis not present

## 2022-06-14 DIAGNOSIS — J3089 Other allergic rhinitis: Secondary | ICD-10-CM | POA: Diagnosis not present

## 2022-06-14 DIAGNOSIS — J301 Allergic rhinitis due to pollen: Secondary | ICD-10-CM | POA: Diagnosis not present

## 2022-06-20 DIAGNOSIS — J3081 Allergic rhinitis due to animal (cat) (dog) hair and dander: Secondary | ICD-10-CM | POA: Diagnosis not present

## 2022-06-20 DIAGNOSIS — J3089 Other allergic rhinitis: Secondary | ICD-10-CM | POA: Diagnosis not present

## 2022-06-20 DIAGNOSIS — J301 Allergic rhinitis due to pollen: Secondary | ICD-10-CM | POA: Diagnosis not present

## 2022-07-04 DIAGNOSIS — J301 Allergic rhinitis due to pollen: Secondary | ICD-10-CM | POA: Diagnosis not present

## 2022-07-04 DIAGNOSIS — J3089 Other allergic rhinitis: Secondary | ICD-10-CM | POA: Diagnosis not present

## 2022-07-04 DIAGNOSIS — J3081 Allergic rhinitis due to animal (cat) (dog) hair and dander: Secondary | ICD-10-CM | POA: Diagnosis not present

## 2022-07-08 DIAGNOSIS — J301 Allergic rhinitis due to pollen: Secondary | ICD-10-CM | POA: Diagnosis not present

## 2022-07-08 DIAGNOSIS — J3089 Other allergic rhinitis: Secondary | ICD-10-CM | POA: Diagnosis not present

## 2022-07-08 DIAGNOSIS — J3081 Allergic rhinitis due to animal (cat) (dog) hair and dander: Secondary | ICD-10-CM | POA: Diagnosis not present

## 2022-07-12 DIAGNOSIS — J3081 Allergic rhinitis due to animal (cat) (dog) hair and dander: Secondary | ICD-10-CM | POA: Diagnosis not present

## 2022-07-12 DIAGNOSIS — J301 Allergic rhinitis due to pollen: Secondary | ICD-10-CM | POA: Diagnosis not present

## 2022-07-12 DIAGNOSIS — J3089 Other allergic rhinitis: Secondary | ICD-10-CM | POA: Diagnosis not present

## 2022-07-15 DIAGNOSIS — J301 Allergic rhinitis due to pollen: Secondary | ICD-10-CM | POA: Diagnosis not present

## 2022-07-15 DIAGNOSIS — J3089 Other allergic rhinitis: Secondary | ICD-10-CM | POA: Diagnosis not present

## 2022-07-15 DIAGNOSIS — J3081 Allergic rhinitis due to animal (cat) (dog) hair and dander: Secondary | ICD-10-CM | POA: Diagnosis not present

## 2022-07-18 DIAGNOSIS — J3089 Other allergic rhinitis: Secondary | ICD-10-CM | POA: Diagnosis not present

## 2022-07-18 DIAGNOSIS — J301 Allergic rhinitis due to pollen: Secondary | ICD-10-CM | POA: Diagnosis not present

## 2022-07-18 DIAGNOSIS — J3081 Allergic rhinitis due to animal (cat) (dog) hair and dander: Secondary | ICD-10-CM | POA: Diagnosis not present

## 2022-07-25 ENCOUNTER — Telehealth: Payer: Self-pay | Admitting: *Deleted

## 2022-07-25 NOTE — Chronic Care Management (AMB) (Signed)
  Care Coordination   Note   07/25/2022 Name: [name] MRN: [id number] DOB: September 16, 1966  [name] is a 56 y.o. year old male who sees Crist Infante, MD for primary care. I reached out to Arley Phenix by phone today to offer care coordination services.  Mr. Wisham was given information about Care Coordination services today including:   The Care Coordination services include support from the care team which includes your Nurse Coordinator, Clinical Social Worker, or Pharmacist.  The Care Coordination team is here to help remove barriers to the health concerns and goals most important to you. Care Coordination services are voluntary, and the patient may decline or stop services at any time by request to their care team member.   Care Coordination Consent Status: Patient did not agree to participate in care coordination services at this time.    Encounter Outcome:  Pt. Refused   [place]  Direct Dial: [phone]

## 2022-07-25 NOTE — Chronic Care Management (AMB) (Signed)
  Care Coordination  Outreach Note  07/25/2022 Name: [name] MRN: [id number] DOB: Jul 01, 1966   Care Coordination Outreach Attempts  An unsuccessful telephone outreach was attempted today to offer the patient information about available care coordination services as a benefit of their health plan.   Follow Up Plan:  Additional outreach attempts will be made to offer the patient care coordination information and services.   Encounter Outcome:  No Answer  [place]  Direct Dial: [phone]

## 2022-08-01 DIAGNOSIS — R29898 Other symptoms and signs involving the musculoskeletal system: Secondary | ICD-10-CM | POA: Diagnosis not present

## 2022-08-01 DIAGNOSIS — J301 Allergic rhinitis due to pollen: Secondary | ICD-10-CM | POA: Diagnosis not present

## 2022-08-01 DIAGNOSIS — M7712 Lateral epicondylitis, left elbow: Secondary | ICD-10-CM | POA: Diagnosis not present

## 2022-08-01 DIAGNOSIS — J3089 Other allergic rhinitis: Secondary | ICD-10-CM | POA: Diagnosis not present

## 2022-08-01 DIAGNOSIS — M25521 Pain in right elbow: Secondary | ICD-10-CM | POA: Diagnosis not present

## 2022-08-01 DIAGNOSIS — J3081 Allergic rhinitis due to animal (cat) (dog) hair and dander: Secondary | ICD-10-CM | POA: Diagnosis not present

## 2022-08-01 DIAGNOSIS — M25522 Pain in left elbow: Secondary | ICD-10-CM | POA: Diagnosis not present

## 2022-08-08 DIAGNOSIS — J3089 Other allergic rhinitis: Secondary | ICD-10-CM | POA: Diagnosis not present

## 2022-08-08 DIAGNOSIS — J3081 Allergic rhinitis due to animal (cat) (dog) hair and dander: Secondary | ICD-10-CM | POA: Diagnosis not present

## 2022-08-08 DIAGNOSIS — J301 Allergic rhinitis due to pollen: Secondary | ICD-10-CM | POA: Diagnosis not present

## 2022-08-14 DIAGNOSIS — J301 Allergic rhinitis due to pollen: Secondary | ICD-10-CM | POA: Diagnosis not present

## 2022-08-14 DIAGNOSIS — J3081 Allergic rhinitis due to animal (cat) (dog) hair and dander: Secondary | ICD-10-CM | POA: Diagnosis not present

## 2022-08-14 DIAGNOSIS — J3089 Other allergic rhinitis: Secondary | ICD-10-CM | POA: Diagnosis not present

## 2022-08-21 DIAGNOSIS — J3081 Allergic rhinitis due to animal (cat) (dog) hair and dander: Secondary | ICD-10-CM | POA: Diagnosis not present

## 2022-08-21 DIAGNOSIS — J3089 Other allergic rhinitis: Secondary | ICD-10-CM | POA: Diagnosis not present

## 2022-08-21 DIAGNOSIS — J301 Allergic rhinitis due to pollen: Secondary | ICD-10-CM | POA: Diagnosis not present

## 2022-08-26 DIAGNOSIS — R339 Retention of urine, unspecified: Secondary | ICD-10-CM | POA: Diagnosis not present

## 2022-08-26 DIAGNOSIS — N319 Neuromuscular dysfunction of bladder, unspecified: Secondary | ICD-10-CM | POA: Diagnosis not present

## 2022-08-26 DIAGNOSIS — N398 Other specified disorders of urinary system: Secondary | ICD-10-CM | POA: Diagnosis not present

## 2022-08-26 DIAGNOSIS — G35 Multiple sclerosis: Secondary | ICD-10-CM | POA: Diagnosis not present

## 2022-08-27 DIAGNOSIS — J3081 Allergic rhinitis due to animal (cat) (dog) hair and dander: Secondary | ICD-10-CM | POA: Diagnosis not present

## 2022-08-27 DIAGNOSIS — J301 Allergic rhinitis due to pollen: Secondary | ICD-10-CM | POA: Diagnosis not present

## 2022-08-27 DIAGNOSIS — J3089 Other allergic rhinitis: Secondary | ICD-10-CM | POA: Diagnosis not present

## 2022-09-10 DIAGNOSIS — J3089 Other allergic rhinitis: Secondary | ICD-10-CM | POA: Diagnosis not present

## 2022-09-10 DIAGNOSIS — J301 Allergic rhinitis due to pollen: Secondary | ICD-10-CM | POA: Diagnosis not present

## 2022-09-10 DIAGNOSIS — J3081 Allergic rhinitis due to animal (cat) (dog) hair and dander: Secondary | ICD-10-CM | POA: Diagnosis not present

## 2022-09-14 DIAGNOSIS — Z23 Encounter for immunization: Secondary | ICD-10-CM | POA: Diagnosis not present

## 2022-09-16 DIAGNOSIS — J3081 Allergic rhinitis due to animal (cat) (dog) hair and dander: Secondary | ICD-10-CM | POA: Diagnosis not present

## 2022-09-16 DIAGNOSIS — J3089 Other allergic rhinitis: Secondary | ICD-10-CM | POA: Diagnosis not present

## 2022-09-16 DIAGNOSIS — J301 Allergic rhinitis due to pollen: Secondary | ICD-10-CM | POA: Diagnosis not present

## 2022-09-19 DIAGNOSIS — H903 Sensorineural hearing loss, bilateral: Secondary | ICD-10-CM | POA: Diagnosis not present

## 2022-09-19 DIAGNOSIS — H9113 Presbycusis, bilateral: Secondary | ICD-10-CM | POA: Diagnosis not present

## 2022-09-23 DIAGNOSIS — J3089 Other allergic rhinitis: Secondary | ICD-10-CM | POA: Diagnosis not present

## 2022-09-23 DIAGNOSIS — J301 Allergic rhinitis due to pollen: Secondary | ICD-10-CM | POA: Diagnosis not present

## 2022-09-23 DIAGNOSIS — J3081 Allergic rhinitis due to animal (cat) (dog) hair and dander: Secondary | ICD-10-CM | POA: Diagnosis not present

## 2022-09-24 DIAGNOSIS — B360 Pityriasis versicolor: Secondary | ICD-10-CM | POA: Diagnosis not present

## 2022-09-24 DIAGNOSIS — L738 Other specified follicular disorders: Secondary | ICD-10-CM | POA: Diagnosis not present

## 2022-09-25 DIAGNOSIS — R29898 Other symptoms and signs involving the musculoskeletal system: Secondary | ICD-10-CM | POA: Diagnosis not present

## 2022-09-25 DIAGNOSIS — M25521 Pain in right elbow: Secondary | ICD-10-CM | POA: Diagnosis not present

## 2022-09-25 DIAGNOSIS — M25522 Pain in left elbow: Secondary | ICD-10-CM | POA: Diagnosis not present

## 2022-09-25 DIAGNOSIS — M7712 Lateral epicondylitis, left elbow: Secondary | ICD-10-CM | POA: Diagnosis not present

## 2022-10-01 DIAGNOSIS — J3081 Allergic rhinitis due to animal (cat) (dog) hair and dander: Secondary | ICD-10-CM | POA: Diagnosis not present

## 2022-10-01 DIAGNOSIS — J301 Allergic rhinitis due to pollen: Secondary | ICD-10-CM | POA: Diagnosis not present

## 2022-10-01 DIAGNOSIS — J3089 Other allergic rhinitis: Secondary | ICD-10-CM | POA: Diagnosis not present

## 2022-10-04 DIAGNOSIS — J309 Allergic rhinitis, unspecified: Secondary | ICD-10-CM | POA: Diagnosis not present

## 2022-10-04 DIAGNOSIS — J300 Vasomotor rhinitis: Secondary | ICD-10-CM | POA: Diagnosis not present

## 2022-10-09 DIAGNOSIS — J3081 Allergic rhinitis due to animal (cat) (dog) hair and dander: Secondary | ICD-10-CM | POA: Diagnosis not present

## 2022-10-09 DIAGNOSIS — J301 Allergic rhinitis due to pollen: Secondary | ICD-10-CM | POA: Diagnosis not present

## 2022-10-09 DIAGNOSIS — J3089 Other allergic rhinitis: Secondary | ICD-10-CM | POA: Diagnosis not present

## 2022-10-10 DIAGNOSIS — J3089 Other allergic rhinitis: Secondary | ICD-10-CM | POA: Diagnosis not present

## 2022-10-10 DIAGNOSIS — J3081 Allergic rhinitis due to animal (cat) (dog) hair and dander: Secondary | ICD-10-CM | POA: Diagnosis not present

## 2022-10-14 DIAGNOSIS — J3081 Allergic rhinitis due to animal (cat) (dog) hair and dander: Secondary | ICD-10-CM | POA: Diagnosis not present

## 2022-10-14 DIAGNOSIS — J301 Allergic rhinitis due to pollen: Secondary | ICD-10-CM | POA: Diagnosis not present

## 2022-10-14 DIAGNOSIS — J3089 Other allergic rhinitis: Secondary | ICD-10-CM | POA: Diagnosis not present

## 2022-10-16 DIAGNOSIS — G501 Atypical facial pain: Secondary | ICD-10-CM | POA: Diagnosis not present

## 2022-10-23 DIAGNOSIS — J3089 Other allergic rhinitis: Secondary | ICD-10-CM | POA: Diagnosis not present

## 2022-10-23 DIAGNOSIS — J3081 Allergic rhinitis due to animal (cat) (dog) hair and dander: Secondary | ICD-10-CM | POA: Diagnosis not present

## 2022-10-23 DIAGNOSIS — J301 Allergic rhinitis due to pollen: Secondary | ICD-10-CM | POA: Diagnosis not present

## 2022-11-08 DIAGNOSIS — J3089 Other allergic rhinitis: Secondary | ICD-10-CM | POA: Diagnosis not present

## 2022-11-08 DIAGNOSIS — J301 Allergic rhinitis due to pollen: Secondary | ICD-10-CM | POA: Diagnosis not present

## 2022-11-08 DIAGNOSIS — J3081 Allergic rhinitis due to animal (cat) (dog) hair and dander: Secondary | ICD-10-CM | POA: Diagnosis not present

## 2022-11-14 DIAGNOSIS — J3089 Other allergic rhinitis: Secondary | ICD-10-CM | POA: Diagnosis not present

## 2022-11-14 DIAGNOSIS — J3081 Allergic rhinitis due to animal (cat) (dog) hair and dander: Secondary | ICD-10-CM | POA: Diagnosis not present

## 2022-11-14 DIAGNOSIS — J301 Allergic rhinitis due to pollen: Secondary | ICD-10-CM | POA: Diagnosis not present

## 2022-11-20 DIAGNOSIS — J301 Allergic rhinitis due to pollen: Secondary | ICD-10-CM | POA: Diagnosis not present

## 2022-11-20 DIAGNOSIS — L738 Other specified follicular disorders: Secondary | ICD-10-CM | POA: Diagnosis not present

## 2022-11-20 DIAGNOSIS — J3089 Other allergic rhinitis: Secondary | ICD-10-CM | POA: Diagnosis not present

## 2022-11-20 DIAGNOSIS — E785 Hyperlipidemia, unspecified: Secondary | ICD-10-CM | POA: Diagnosis not present

## 2022-11-20 DIAGNOSIS — J3081 Allergic rhinitis due to animal (cat) (dog) hair and dander: Secondary | ICD-10-CM | POA: Diagnosis not present

## 2022-11-20 DIAGNOSIS — B360 Pityriasis versicolor: Secondary | ICD-10-CM | POA: Diagnosis not present

## 2022-11-26 DIAGNOSIS — J3081 Allergic rhinitis due to animal (cat) (dog) hair and dander: Secondary | ICD-10-CM | POA: Diagnosis not present

## 2022-11-26 DIAGNOSIS — J3089 Other allergic rhinitis: Secondary | ICD-10-CM | POA: Diagnosis not present

## 2022-11-26 DIAGNOSIS — J301 Allergic rhinitis due to pollen: Secondary | ICD-10-CM | POA: Diagnosis not present

## 2022-12-06 DIAGNOSIS — J3089 Other allergic rhinitis: Secondary | ICD-10-CM | POA: Diagnosis not present

## 2022-12-06 DIAGNOSIS — J301 Allergic rhinitis due to pollen: Secondary | ICD-10-CM | POA: Diagnosis not present

## 2022-12-06 DIAGNOSIS — J3081 Allergic rhinitis due to animal (cat) (dog) hair and dander: Secondary | ICD-10-CM | POA: Diagnosis not present

## 2022-12-11 DIAGNOSIS — J301 Allergic rhinitis due to pollen: Secondary | ICD-10-CM | POA: Diagnosis not present

## 2022-12-11 DIAGNOSIS — J3081 Allergic rhinitis due to animal (cat) (dog) hair and dander: Secondary | ICD-10-CM | POA: Diagnosis not present

## 2022-12-11 DIAGNOSIS — J3089 Other allergic rhinitis: Secondary | ICD-10-CM | POA: Diagnosis not present

## 2022-12-13 DIAGNOSIS — G3184 Mild cognitive impairment, so stated: Secondary | ICD-10-CM | POA: Diagnosis not present

## 2022-12-16 DIAGNOSIS — J3081 Allergic rhinitis due to animal (cat) (dog) hair and dander: Secondary | ICD-10-CM | POA: Diagnosis not present

## 2022-12-16 DIAGNOSIS — J3089 Other allergic rhinitis: Secondary | ICD-10-CM | POA: Diagnosis not present

## 2022-12-16 DIAGNOSIS — J301 Allergic rhinitis due to pollen: Secondary | ICD-10-CM | POA: Diagnosis not present

## 2022-12-26 DIAGNOSIS — J3089 Other allergic rhinitis: Secondary | ICD-10-CM | POA: Diagnosis not present

## 2022-12-26 DIAGNOSIS — J301 Allergic rhinitis due to pollen: Secondary | ICD-10-CM | POA: Diagnosis not present

## 2022-12-26 DIAGNOSIS — J3081 Allergic rhinitis due to animal (cat) (dog) hair and dander: Secondary | ICD-10-CM | POA: Diagnosis not present

## 2022-12-30 ENCOUNTER — Other Ambulatory Visit (HOSPITAL_COMMUNITY): Payer: Self-pay

## 2022-12-30 MED ORDER — ESZOPICLONE 3 MG PO TABS
3.0000 mg | ORAL_TABLET | Freq: Every evening | ORAL | 4 refills | Status: DC | PRN
  Filled 2022-12-30: qty 30, 30d supply, fill #0
  Filled 2023-01-27: qty 30, 30d supply, fill #1
  Filled 2023-02-18 – 2023-02-26 (×2): qty 30, 30d supply, fill #2

## 2023-01-01 DIAGNOSIS — L308 Other specified dermatitis: Secondary | ICD-10-CM | POA: Diagnosis not present

## 2023-01-01 DIAGNOSIS — D4819 Other specified neoplasm of uncertain behavior of connective and other soft tissue: Secondary | ICD-10-CM | POA: Diagnosis not present

## 2023-01-01 DIAGNOSIS — L720 Epidermal cyst: Secondary | ICD-10-CM | POA: Diagnosis not present

## 2023-01-02 DIAGNOSIS — J301 Allergic rhinitis due to pollen: Secondary | ICD-10-CM | POA: Diagnosis not present

## 2023-01-02 DIAGNOSIS — J3081 Allergic rhinitis due to animal (cat) (dog) hair and dander: Secondary | ICD-10-CM | POA: Diagnosis not present

## 2023-01-02 DIAGNOSIS — J3089 Other allergic rhinitis: Secondary | ICD-10-CM | POA: Diagnosis not present

## 2023-01-07 DIAGNOSIS — J301 Allergic rhinitis due to pollen: Secondary | ICD-10-CM | POA: Diagnosis not present

## 2023-01-07 DIAGNOSIS — J3081 Allergic rhinitis due to animal (cat) (dog) hair and dander: Secondary | ICD-10-CM | POA: Diagnosis not present

## 2023-01-07 DIAGNOSIS — J3089 Other allergic rhinitis: Secondary | ICD-10-CM | POA: Diagnosis not present

## 2023-01-08 ENCOUNTER — Other Ambulatory Visit (HOSPITAL_COMMUNITY): Payer: Self-pay

## 2023-01-08 MED ORDER — IPRATROPIUM BROMIDE 0.06 % NA SOLN
2.0000 | Freq: Three times a day (TID) | NASAL | 2 refills | Status: AC
  Filled 2023-01-08 – 2023-01-20 (×2): qty 15, 25d supply, fill #0
  Filled 2023-02-26: qty 15, 25d supply, fill #1
  Filled 2023-03-31: qty 15, 25d supply, fill #2

## 2023-01-10 ENCOUNTER — Other Ambulatory Visit (HOSPITAL_COMMUNITY): Payer: Self-pay

## 2023-01-16 DIAGNOSIS — J301 Allergic rhinitis due to pollen: Secondary | ICD-10-CM | POA: Diagnosis not present

## 2023-01-16 DIAGNOSIS — J3081 Allergic rhinitis due to animal (cat) (dog) hair and dander: Secondary | ICD-10-CM | POA: Diagnosis not present

## 2023-01-16 DIAGNOSIS — J3089 Other allergic rhinitis: Secondary | ICD-10-CM | POA: Diagnosis not present

## 2023-01-17 ENCOUNTER — Other Ambulatory Visit (HOSPITAL_COMMUNITY): Payer: Self-pay

## 2023-01-20 ENCOUNTER — Other Ambulatory Visit (HOSPITAL_COMMUNITY): Payer: Self-pay

## 2023-01-22 DIAGNOSIS — J3089 Other allergic rhinitis: Secondary | ICD-10-CM | POA: Diagnosis not present

## 2023-01-22 DIAGNOSIS — J3081 Allergic rhinitis due to animal (cat) (dog) hair and dander: Secondary | ICD-10-CM | POA: Diagnosis not present

## 2023-01-22 DIAGNOSIS — J301 Allergic rhinitis due to pollen: Secondary | ICD-10-CM | POA: Diagnosis not present

## 2023-01-27 ENCOUNTER — Other Ambulatory Visit (HOSPITAL_COMMUNITY): Payer: Self-pay

## 2023-01-28 DIAGNOSIS — Z960 Presence of urogenital implants: Secondary | ICD-10-CM | POA: Diagnosis not present

## 2023-01-28 DIAGNOSIS — N401 Enlarged prostate with lower urinary tract symptoms: Secondary | ICD-10-CM | POA: Diagnosis not present

## 2023-01-28 DIAGNOSIS — N5201 Erectile dysfunction due to arterial insufficiency: Secondary | ICD-10-CM | POA: Diagnosis not present

## 2023-01-28 DIAGNOSIS — N319 Neuromuscular dysfunction of bladder, unspecified: Secondary | ICD-10-CM | POA: Diagnosis not present

## 2023-01-30 DIAGNOSIS — J3089 Other allergic rhinitis: Secondary | ICD-10-CM | POA: Diagnosis not present

## 2023-01-30 DIAGNOSIS — J3081 Allergic rhinitis due to animal (cat) (dog) hair and dander: Secondary | ICD-10-CM | POA: Diagnosis not present

## 2023-01-30 DIAGNOSIS — J301 Allergic rhinitis due to pollen: Secondary | ICD-10-CM | POA: Diagnosis not present

## 2023-02-05 DIAGNOSIS — L738 Other specified follicular disorders: Secondary | ICD-10-CM | POA: Diagnosis not present

## 2023-02-18 ENCOUNTER — Other Ambulatory Visit (HOSPITAL_COMMUNITY): Payer: Self-pay

## 2023-02-18 DIAGNOSIS — H2513 Age-related nuclear cataract, bilateral: Secondary | ICD-10-CM | POA: Diagnosis not present

## 2023-02-18 DIAGNOSIS — G35 Multiple sclerosis: Secondary | ICD-10-CM | POA: Diagnosis not present

## 2023-02-26 ENCOUNTER — Other Ambulatory Visit (HOSPITAL_COMMUNITY): Payer: Self-pay

## 2023-03-05 DIAGNOSIS — T83490D Other mechanical complication of penile (implanted) prosthesis, subsequent encounter: Secondary | ICD-10-CM | POA: Diagnosis not present

## 2023-03-05 DIAGNOSIS — F5232 Male orgasmic disorder: Secondary | ICD-10-CM | POA: Diagnosis not present

## 2023-03-05 DIAGNOSIS — N319 Neuromuscular dysfunction of bladder, unspecified: Secondary | ICD-10-CM | POA: Diagnosis not present

## 2023-03-05 DIAGNOSIS — G35 Multiple sclerosis: Secondary | ICD-10-CM | POA: Diagnosis not present

## 2023-03-06 DIAGNOSIS — G500 Trigeminal neuralgia: Secondary | ICD-10-CM | POA: Diagnosis not present

## 2023-03-06 DIAGNOSIS — M7918 Myalgia, other site: Secondary | ICD-10-CM | POA: Diagnosis not present

## 2023-03-10 DIAGNOSIS — L729 Follicular cyst of the skin and subcutaneous tissue, unspecified: Secondary | ICD-10-CM | POA: Diagnosis not present

## 2023-03-10 DIAGNOSIS — L986 Other infiltrative disorders of the skin and subcutaneous tissue: Secondary | ICD-10-CM | POA: Diagnosis not present

## 2023-03-17 DIAGNOSIS — Z125 Encounter for screening for malignant neoplasm of prostate: Secondary | ICD-10-CM | POA: Diagnosis not present

## 2023-03-17 DIAGNOSIS — E785 Hyperlipidemia, unspecified: Secondary | ICD-10-CM | POA: Diagnosis not present

## 2023-03-17 DIAGNOSIS — Z1212 Encounter for screening for malignant neoplasm of rectum: Secondary | ICD-10-CM | POA: Diagnosis not present

## 2023-03-17 DIAGNOSIS — F5232 Male orgasmic disorder: Secondary | ICD-10-CM | POA: Diagnosis not present

## 2023-03-17 DIAGNOSIS — R7301 Impaired fasting glucose: Secondary | ICD-10-CM | POA: Diagnosis not present

## 2023-03-17 DIAGNOSIS — M8589 Other specified disorders of bone density and structure, multiple sites: Secondary | ICD-10-CM | POA: Diagnosis not present

## 2023-03-17 DIAGNOSIS — K219 Gastro-esophageal reflux disease without esophagitis: Secondary | ICD-10-CM | POA: Diagnosis not present

## 2023-03-17 DIAGNOSIS — E673 Hypervitaminosis D: Secondary | ICD-10-CM | POA: Diagnosis not present

## 2023-03-18 DIAGNOSIS — R82998 Other abnormal findings in urine: Secondary | ICD-10-CM | POA: Diagnosis not present

## 2023-03-19 DIAGNOSIS — H903 Sensorineural hearing loss, bilateral: Secondary | ICD-10-CM | POA: Diagnosis not present

## 2023-03-19 DIAGNOSIS — T162XXA Foreign body in left ear, initial encounter: Secondary | ICD-10-CM | POA: Diagnosis not present

## 2023-03-19 DIAGNOSIS — H938X9 Other specified disorders of ear, unspecified ear: Secondary | ICD-10-CM | POA: Diagnosis not present

## 2023-03-24 DIAGNOSIS — Z0000 Encounter for general adult medical examination without abnormal findings: Secondary | ICD-10-CM | POA: Diagnosis not present

## 2023-03-24 DIAGNOSIS — E785 Hyperlipidemia, unspecified: Secondary | ICD-10-CM | POA: Diagnosis not present

## 2023-03-24 DIAGNOSIS — G35 Multiple sclerosis: Secondary | ICD-10-CM | POA: Diagnosis not present

## 2023-03-24 DIAGNOSIS — C8400 Mycosis fungoides, unspecified site: Secondary | ICD-10-CM | POA: Diagnosis not present

## 2023-03-24 DIAGNOSIS — J309 Allergic rhinitis, unspecified: Secondary | ICD-10-CM | POA: Diagnosis not present

## 2023-03-31 ENCOUNTER — Other Ambulatory Visit (HOSPITAL_COMMUNITY): Payer: Self-pay

## 2023-03-31 ENCOUNTER — Other Ambulatory Visit: Payer: Self-pay

## 2023-04-01 ENCOUNTER — Other Ambulatory Visit (HOSPITAL_COMMUNITY): Payer: Self-pay

## 2023-04-01 MED ORDER — ESZOPICLONE 3 MG PO TABS
3.0000 mg | ORAL_TABLET | Freq: Every evening | ORAL | 5 refills | Status: DC | PRN
  Filled 2023-04-01 (×3): qty 30, 30d supply, fill #0
  Filled 2023-04-30: qty 30, 30d supply, fill #1

## 2023-04-22 DIAGNOSIS — H905 Unspecified sensorineural hearing loss: Secondary | ICD-10-CM | POA: Diagnosis not present

## 2023-04-22 DIAGNOSIS — G500 Trigeminal neuralgia: Secondary | ICD-10-CM | POA: Diagnosis not present

## 2023-04-22 DIAGNOSIS — G35 Multiple sclerosis: Secondary | ICD-10-CM | POA: Diagnosis not present

## 2023-04-22 DIAGNOSIS — D320 Benign neoplasm of cerebral meninges: Secondary | ICD-10-CM | POA: Diagnosis not present

## 2023-04-22 DIAGNOSIS — Z0110 Encounter for examination of ears and hearing without abnormal findings: Secondary | ICD-10-CM | POA: Diagnosis not present

## 2023-04-30 ENCOUNTER — Other Ambulatory Visit (HOSPITAL_COMMUNITY): Payer: Self-pay

## 2023-05-01 ENCOUNTER — Other Ambulatory Visit (HOSPITAL_COMMUNITY): Payer: Self-pay

## 2023-05-01 MED ORDER — IPRATROPIUM BROMIDE 0.06 % NA SOLN
NASAL | 5 refills | Status: AC
  Filled 2023-05-01: qty 15, 30d supply, fill #0
  Filled 2023-06-02: qty 15, 30d supply, fill #1
  Filled 2023-08-24: qty 15, 30d supply, fill #2
  Filled 2023-10-22 – 2023-11-19 (×2): qty 15, 30d supply, fill #3
  Filled 2023-12-14 – 2024-02-14 (×2): qty 15, 30d supply, fill #4

## 2023-05-19 ENCOUNTER — Other Ambulatory Visit (HOSPITAL_COMMUNITY): Payer: Self-pay

## 2023-05-20 DIAGNOSIS — C84A0 Cutaneous T-cell lymphoma, unspecified, unspecified site: Secondary | ICD-10-CM | POA: Diagnosis not present

## 2023-05-20 DIAGNOSIS — L986 Other infiltrative disorders of the skin and subcutaneous tissue: Secondary | ICD-10-CM | POA: Diagnosis not present

## 2023-05-20 DIAGNOSIS — Z79899 Other long term (current) drug therapy: Secondary | ICD-10-CM | POA: Diagnosis not present

## 2023-05-20 DIAGNOSIS — R21 Rash and other nonspecific skin eruption: Secondary | ICD-10-CM | POA: Diagnosis not present

## 2023-05-20 DIAGNOSIS — C8400 Mycosis fungoides, unspecified site: Secondary | ICD-10-CM | POA: Diagnosis not present

## 2023-05-20 DIAGNOSIS — F17290 Nicotine dependence, other tobacco product, uncomplicated: Secondary | ICD-10-CM | POA: Diagnosis not present

## 2023-06-02 ENCOUNTER — Other Ambulatory Visit (HOSPITAL_COMMUNITY): Payer: Self-pay

## 2023-06-02 ENCOUNTER — Other Ambulatory Visit: Payer: Self-pay

## 2023-06-02 MED ORDER — IPRATROPIUM BROMIDE 0.06 % NA SOLN
2.0000 | Freq: Three times a day (TID) | NASAL | 3 refills | Status: AC
  Filled 2023-06-02 – 2024-02-14 (×4): qty 15, 25d supply, fill #0

## 2023-06-06 DIAGNOSIS — C8400 Mycosis fungoides, unspecified site: Secondary | ICD-10-CM | POA: Diagnosis not present

## 2023-06-10 DIAGNOSIS — N319 Neuromuscular dysfunction of bladder, unspecified: Secondary | ICD-10-CM | POA: Diagnosis not present

## 2023-06-10 DIAGNOSIS — R3914 Feeling of incomplete bladder emptying: Secondary | ICD-10-CM | POA: Diagnosis not present

## 2023-06-10 DIAGNOSIS — N401 Enlarged prostate with lower urinary tract symptoms: Secondary | ICD-10-CM | POA: Diagnosis not present

## 2023-06-11 DIAGNOSIS — L986 Other infiltrative disorders of the skin and subcutaneous tissue: Secondary | ICD-10-CM | POA: Diagnosis not present

## 2023-06-19 DIAGNOSIS — C8400 Mycosis fungoides, unspecified site: Secondary | ICD-10-CM | POA: Diagnosis not present

## 2023-06-24 DIAGNOSIS — C8400 Mycosis fungoides, unspecified site: Secondary | ICD-10-CM | POA: Diagnosis not present

## 2023-06-26 DIAGNOSIS — C8400 Mycosis fungoides, unspecified site: Secondary | ICD-10-CM | POA: Diagnosis not present

## 2023-06-27 DIAGNOSIS — C8400 Mycosis fungoides, unspecified site: Secondary | ICD-10-CM | POA: Diagnosis not present

## 2023-07-01 DIAGNOSIS — C8400 Mycosis fungoides, unspecified site: Secondary | ICD-10-CM | POA: Diagnosis not present

## 2023-07-03 DIAGNOSIS — C8400 Mycosis fungoides, unspecified site: Secondary | ICD-10-CM | POA: Diagnosis not present

## 2023-07-07 DIAGNOSIS — C8400 Mycosis fungoides, unspecified site: Secondary | ICD-10-CM | POA: Diagnosis not present

## 2023-07-10 DIAGNOSIS — C8400 Mycosis fungoides, unspecified site: Secondary | ICD-10-CM | POA: Diagnosis not present

## 2023-07-15 DIAGNOSIS — C8400 Mycosis fungoides, unspecified site: Secondary | ICD-10-CM | POA: Diagnosis not present

## 2023-07-17 DIAGNOSIS — C8400 Mycosis fungoides, unspecified site: Secondary | ICD-10-CM | POA: Diagnosis not present

## 2023-07-18 DIAGNOSIS — N319 Neuromuscular dysfunction of bladder, unspecified: Secondary | ICD-10-CM | POA: Diagnosis not present

## 2023-07-18 DIAGNOSIS — G35 Multiple sclerosis: Secondary | ICD-10-CM | POA: Diagnosis not present

## 2023-07-18 DIAGNOSIS — Z960 Presence of urogenital implants: Secondary | ICD-10-CM | POA: Diagnosis not present

## 2023-07-18 DIAGNOSIS — N528 Other male erectile dysfunction: Secondary | ICD-10-CM | POA: Diagnosis not present

## 2023-07-22 DIAGNOSIS — C8400 Mycosis fungoides, unspecified site: Secondary | ICD-10-CM | POA: Diagnosis not present

## 2023-07-24 DIAGNOSIS — C8400 Mycosis fungoides, unspecified site: Secondary | ICD-10-CM | POA: Diagnosis not present

## 2023-07-29 DIAGNOSIS — C8400 Mycosis fungoides, unspecified site: Secondary | ICD-10-CM | POA: Diagnosis not present

## 2023-07-31 DIAGNOSIS — C8400 Mycosis fungoides, unspecified site: Secondary | ICD-10-CM | POA: Diagnosis not present

## 2023-08-07 DIAGNOSIS — C8400 Mycosis fungoides, unspecified site: Secondary | ICD-10-CM | POA: Diagnosis not present

## 2023-08-14 DIAGNOSIS — C8400 Mycosis fungoides, unspecified site: Secondary | ICD-10-CM | POA: Diagnosis not present

## 2023-08-19 DIAGNOSIS — C8400 Mycosis fungoides, unspecified site: Secondary | ICD-10-CM | POA: Diagnosis not present

## 2023-08-27 ENCOUNTER — Other Ambulatory Visit (HOSPITAL_COMMUNITY): Payer: Self-pay

## 2023-09-01 DIAGNOSIS — C8400 Mycosis fungoides, unspecified site: Secondary | ICD-10-CM | POA: Diagnosis not present

## 2023-09-03 DIAGNOSIS — N401 Enlarged prostate with lower urinary tract symptoms: Secondary | ICD-10-CM | POA: Diagnosis not present

## 2023-09-03 DIAGNOSIS — N5314 Retrograde ejaculation: Secondary | ICD-10-CM | POA: Diagnosis not present

## 2023-09-03 DIAGNOSIS — N398 Other specified disorders of urinary system: Secondary | ICD-10-CM | POA: Diagnosis not present

## 2023-09-03 DIAGNOSIS — N3943 Post-void dribbling: Secondary | ICD-10-CM | POA: Diagnosis not present

## 2023-09-04 DIAGNOSIS — C8400 Mycosis fungoides, unspecified site: Secondary | ICD-10-CM | POA: Diagnosis not present

## 2023-09-09 DIAGNOSIS — C8400 Mycosis fungoides, unspecified site: Secondary | ICD-10-CM | POA: Diagnosis not present

## 2023-09-18 DIAGNOSIS — L738 Other specified follicular disorders: Secondary | ICD-10-CM | POA: Diagnosis not present

## 2023-09-18 DIAGNOSIS — C8400 Mycosis fungoides, unspecified site: Secondary | ICD-10-CM | POA: Diagnosis not present

## 2023-09-19 DIAGNOSIS — M26633 Articular disc disorder of bilateral temporomandibular joint: Secondary | ICD-10-CM | POA: Diagnosis not present

## 2023-09-19 DIAGNOSIS — G35 Multiple sclerosis: Secondary | ICD-10-CM | POA: Diagnosis not present

## 2023-09-19 DIAGNOSIS — G501 Atypical facial pain: Secondary | ICD-10-CM | POA: Diagnosis not present

## 2023-09-19 DIAGNOSIS — M26603 Bilateral temporomandibular joint disorder, unspecified: Secondary | ICD-10-CM | POA: Diagnosis not present

## 2023-09-19 DIAGNOSIS — D320 Benign neoplasm of cerebral meninges: Secondary | ICD-10-CM | POA: Diagnosis not present

## 2023-09-23 DIAGNOSIS — N398 Other specified disorders of urinary system: Secondary | ICD-10-CM | POA: Diagnosis not present

## 2023-09-23 DIAGNOSIS — N3943 Post-void dribbling: Secondary | ICD-10-CM | POA: Diagnosis not present

## 2023-09-23 DIAGNOSIS — N401 Enlarged prostate with lower urinary tract symptoms: Secondary | ICD-10-CM | POA: Diagnosis not present

## 2023-09-24 ENCOUNTER — Other Ambulatory Visit (HOSPITAL_COMMUNITY): Payer: Self-pay

## 2023-09-24 DIAGNOSIS — J309 Allergic rhinitis, unspecified: Secondary | ICD-10-CM | POA: Diagnosis not present

## 2023-09-24 DIAGNOSIS — C8400 Mycosis fungoides, unspecified site: Secondary | ICD-10-CM | POA: Diagnosis not present

## 2023-09-24 DIAGNOSIS — J300 Vasomotor rhinitis: Secondary | ICD-10-CM | POA: Diagnosis not present

## 2023-09-25 ENCOUNTER — Other Ambulatory Visit (HOSPITAL_COMMUNITY): Payer: Self-pay

## 2023-09-25 DIAGNOSIS — N320 Bladder-neck obstruction: Secondary | ICD-10-CM | POA: Diagnosis not present

## 2023-09-25 MED ORDER — IPRATROPIUM BROMIDE 0.06 % NA SOLN
2.0000 | Freq: Three times a day (TID) | NASAL | 5 refills | Status: AC
  Filled 2023-09-25: qty 15, 30d supply, fill #0
  Filled 2023-10-22: qty 15, 30d supply, fill #1
  Filled 2023-10-23 – 2023-12-14 (×2): qty 15, 30d supply, fill #2
  Filled 2024-01-05: qty 15, 27d supply, fill #2
  Filled 2024-02-14: qty 15, 27d supply, fill #3

## 2023-09-26 DIAGNOSIS — C8400 Mycosis fungoides, unspecified site: Secondary | ICD-10-CM | POA: Diagnosis not present

## 2023-09-30 DIAGNOSIS — Z23 Encounter for immunization: Secondary | ICD-10-CM | POA: Diagnosis not present

## 2023-09-30 DIAGNOSIS — R7301 Impaired fasting glucose: Secondary | ICD-10-CM | POA: Diagnosis not present

## 2023-09-30 DIAGNOSIS — C8400 Mycosis fungoides, unspecified site: Secondary | ICD-10-CM | POA: Diagnosis not present

## 2023-10-02 DIAGNOSIS — C8400 Mycosis fungoides, unspecified site: Secondary | ICD-10-CM | POA: Diagnosis not present

## 2023-10-07 DIAGNOSIS — C8400 Mycosis fungoides, unspecified site: Secondary | ICD-10-CM | POA: Diagnosis not present

## 2023-10-08 DIAGNOSIS — L986 Other infiltrative disorders of the skin and subcutaneous tissue: Secondary | ICD-10-CM | POA: Diagnosis not present

## 2023-10-15 DIAGNOSIS — C8400 Mycosis fungoides, unspecified site: Secondary | ICD-10-CM | POA: Diagnosis not present

## 2023-10-22 DIAGNOSIS — C8400 Mycosis fungoides, unspecified site: Secondary | ICD-10-CM | POA: Diagnosis not present

## 2023-10-23 ENCOUNTER — Other Ambulatory Visit: Payer: Self-pay

## 2023-10-24 ENCOUNTER — Other Ambulatory Visit: Payer: Self-pay

## 2023-10-24 DIAGNOSIS — C8400 Mycosis fungoides, unspecified site: Secondary | ICD-10-CM | POA: Diagnosis not present

## 2023-10-27 ENCOUNTER — Other Ambulatory Visit (HOSPITAL_COMMUNITY): Payer: Self-pay

## 2023-10-28 DIAGNOSIS — G35 Multiple sclerosis: Secondary | ICD-10-CM | POA: Diagnosis not present

## 2023-10-28 DIAGNOSIS — R4189 Other symptoms and signs involving cognitive functions and awareness: Secondary | ICD-10-CM | POA: Diagnosis not present

## 2023-10-29 DIAGNOSIS — N401 Enlarged prostate with lower urinary tract symptoms: Secondary | ICD-10-CM | POA: Diagnosis not present

## 2023-10-29 DIAGNOSIS — N3943 Post-void dribbling: Secondary | ICD-10-CM | POA: Diagnosis not present

## 2023-10-29 DIAGNOSIS — N398 Other specified disorders of urinary system: Secondary | ICD-10-CM | POA: Diagnosis not present

## 2023-10-29 DIAGNOSIS — R339 Retention of urine, unspecified: Secondary | ICD-10-CM | POA: Diagnosis not present

## 2023-10-30 DIAGNOSIS — C8400 Mycosis fungoides, unspecified site: Secondary | ICD-10-CM | POA: Diagnosis not present

## 2023-11-03 DIAGNOSIS — C8400 Mycosis fungoides, unspecified site: Secondary | ICD-10-CM | POA: Diagnosis not present

## 2023-11-05 DIAGNOSIS — C8400 Mycosis fungoides, unspecified site: Secondary | ICD-10-CM | POA: Diagnosis not present

## 2023-11-10 DIAGNOSIS — H6993 Unspecified Eustachian tube disorder, bilateral: Secondary | ICD-10-CM | POA: Diagnosis not present

## 2023-11-10 DIAGNOSIS — M26609 Unspecified temporomandibular joint disorder, unspecified side: Secondary | ICD-10-CM | POA: Diagnosis not present

## 2023-11-10 DIAGNOSIS — H903 Sensorineural hearing loss, bilateral: Secondary | ICD-10-CM | POA: Diagnosis not present

## 2023-11-10 DIAGNOSIS — J309 Allergic rhinitis, unspecified: Secondary | ICD-10-CM | POA: Diagnosis not present

## 2023-11-11 DIAGNOSIS — C8400 Mycosis fungoides, unspecified site: Secondary | ICD-10-CM | POA: Diagnosis not present

## 2023-11-13 DIAGNOSIS — C8400 Mycosis fungoides, unspecified site: Secondary | ICD-10-CM | POA: Diagnosis not present

## 2023-11-18 DIAGNOSIS — L723 Sebaceous cyst: Secondary | ICD-10-CM | POA: Diagnosis not present

## 2023-11-18 DIAGNOSIS — C8400 Mycosis fungoides, unspecified site: Secondary | ICD-10-CM | POA: Diagnosis not present

## 2023-11-20 DIAGNOSIS — C8400 Mycosis fungoides, unspecified site: Secondary | ICD-10-CM | POA: Diagnosis not present

## 2023-11-25 DIAGNOSIS — C8400 Mycosis fungoides, unspecified site: Secondary | ICD-10-CM | POA: Diagnosis not present

## 2023-12-02 ENCOUNTER — Other Ambulatory Visit (HOSPITAL_COMMUNITY): Payer: Self-pay

## 2023-12-02 ENCOUNTER — Other Ambulatory Visit (HOSPITAL_BASED_OUTPATIENT_CLINIC_OR_DEPARTMENT_OTHER): Payer: Self-pay

## 2023-12-12 DIAGNOSIS — C8400 Mycosis fungoides, unspecified site: Secondary | ICD-10-CM | POA: Diagnosis not present

## 2023-12-15 ENCOUNTER — Other Ambulatory Visit: Payer: Self-pay

## 2023-12-15 ENCOUNTER — Other Ambulatory Visit (HOSPITAL_COMMUNITY): Payer: Self-pay

## 2023-12-17 ENCOUNTER — Other Ambulatory Visit: Payer: Self-pay

## 2023-12-17 DIAGNOSIS — C8400 Mycosis fungoides, unspecified site: Secondary | ICD-10-CM | POA: Diagnosis not present

## 2023-12-22 DIAGNOSIS — C8400 Mycosis fungoides, unspecified site: Secondary | ICD-10-CM | POA: Diagnosis not present

## 2023-12-24 DIAGNOSIS — G3184 Mild cognitive impairment, so stated: Secondary | ICD-10-CM | POA: Diagnosis not present

## 2023-12-29 ENCOUNTER — Other Ambulatory Visit (HOSPITAL_COMMUNITY): Payer: Self-pay

## 2023-12-31 DIAGNOSIS — C8400 Mycosis fungoides, unspecified site: Secondary | ICD-10-CM | POA: Diagnosis not present

## 2024-01-05 ENCOUNTER — Other Ambulatory Visit (HOSPITAL_COMMUNITY): Payer: Self-pay

## 2024-01-07 DIAGNOSIS — C8400 Mycosis fungoides, unspecified site: Secondary | ICD-10-CM | POA: Diagnosis not present

## 2024-01-09 ENCOUNTER — Other Ambulatory Visit (HOSPITAL_COMMUNITY): Payer: Self-pay

## 2024-01-13 ENCOUNTER — Other Ambulatory Visit (HOSPITAL_COMMUNITY): Payer: Self-pay

## 2024-01-13 DIAGNOSIS — M79671 Pain in right foot: Secondary | ICD-10-CM | POA: Diagnosis not present

## 2024-01-13 DIAGNOSIS — M961 Postlaminectomy syndrome, not elsewhere classified: Secondary | ICD-10-CM | POA: Diagnosis not present

## 2024-01-13 DIAGNOSIS — G35 Multiple sclerosis: Secondary | ICD-10-CM | POA: Diagnosis not present

## 2024-01-13 DIAGNOSIS — G894 Chronic pain syndrome: Secondary | ICD-10-CM | POA: Diagnosis not present

## 2024-01-14 ENCOUNTER — Other Ambulatory Visit (HOSPITAL_COMMUNITY): Payer: Self-pay

## 2024-01-14 DIAGNOSIS — G500 Trigeminal neuralgia: Secondary | ICD-10-CM | POA: Diagnosis not present

## 2024-01-14 DIAGNOSIS — M26603 Bilateral temporomandibular joint disorder, unspecified: Secondary | ICD-10-CM | POA: Diagnosis not present

## 2024-01-14 DIAGNOSIS — M7918 Myalgia, other site: Secondary | ICD-10-CM | POA: Diagnosis not present

## 2024-01-14 MED ORDER — ESZOPICLONE 3 MG PO TABS
3.0000 mg | ORAL_TABLET | Freq: Every day | ORAL | 5 refills | Status: AC
  Filled 2024-01-14 – 2024-02-14 (×2): qty 30, 30d supply, fill #0
  Filled ????-??-??: fill #0

## 2024-01-14 MED ORDER — CLONAZEPAM 0.5 MG PO TABS
0.5000 mg | ORAL_TABLET | Freq: Every day | ORAL | 0 refills | Status: AC
  Filled 2024-01-14: qty 30, 15d supply, fill #0

## 2024-01-14 MED ORDER — ESZOPICLONE 3 MG PO TABS
3.0000 mg | ORAL_TABLET | Freq: Every evening | ORAL | 0 refills | Status: AC | PRN
  Filled 2024-01-14 – 2024-01-15 (×2): qty 30, 30d supply, fill #0

## 2024-01-15 ENCOUNTER — Other Ambulatory Visit (HOSPITAL_COMMUNITY): Payer: Self-pay

## 2024-01-15 DIAGNOSIS — C8400 Mycosis fungoides, unspecified site: Secondary | ICD-10-CM | POA: Diagnosis not present

## 2024-01-20 DIAGNOSIS — C8400 Mycosis fungoides, unspecified site: Secondary | ICD-10-CM | POA: Diagnosis not present

## 2024-01-22 DIAGNOSIS — G629 Polyneuropathy, unspecified: Secondary | ICD-10-CM | POA: Diagnosis not present

## 2024-01-23 NOTE — Therapy (Signed)
 OUTPATIENT PHYSICAL THERAPY MALE PELVIC EVALUATION   Patient Name: [name] MRN: [id number] DOB:Jun 25, 1966, 58 y.o., male Today's Date: 01/26/2024  END OF SESSION:  PT End of Session - 01/26/24 1107     Visit Number 1    Date for PT Re-Evaluation 07/25/24    Authorization Type BCBS    PT Start Time 1100    PT Stop Time 1140    PT Time Calculation (min) 40 min    Activity Tolerance Patient tolerated treatment well    Behavior During Therapy [facility] for tasks assessed/performed             Past Medical History:  Diagnosis Date   ALLERGIC RHINITIS 03/27/2010   Fracture, ribs 12/31/2012   Non-displaced fracture of right 8th-11th ribs   Gait disorder    Gastroparesis    GERD 03/27/2010   Hx of adenomatous polyp of colon 08/12/2017   Insomnia    MULTIPLE SCLEROSIS, RELAPSING/REMITTING 03/27/2010   Neurogenic bladder    NICOTINE ADDICTION 05/24/2010   Peripheral neuropathy    Pleural effusion on right 12/31/2012   Secondary to rib fractures   PONV (postoperative nausea and vomiting)    Rib fracture 11/2012   TMJ (dislocation of temporomandibular joint)    Past Surgical History:  Procedure Laterality Date   ANKLE SURGERY Left    ANTERIOR CERVICAL DECOMP/DISCECTOMY FUSION N/A 09/02/2018   Procedure: ANTERIOR CERVICAL DECOMPRESSION/DISCECTOMY FUSION CERVICAL FIVE- CERVICAL SIX;  Surgeon: Shirlean Kelly, MD;  Location: [facility] OR;  Service: Neurosurgery;  Laterality: N/A;   COLONOSCOPY     FOOT SURGERY Right    INCISION AND DRAINAGE OF WOUND  2015   LUMBAR LAMINECTOMY/DECOMPRESSION MICRODISCECTOMY Left 11/25/2018   Procedure: LEFT LUMBAR THREE- LUMBAR FOUR EXTRAFORAMINAL MICRODISCECTOMY;  Surgeon: Shirlean Kelly, MD;  Location: [facility] OR;  Service: Neurosurgery;  Laterality: Left;   PLEURAL EFFUSION DRAINAGE  01/01/2013   Procedure: DRAINAGE OF PLEURAL EFFUSION;  Surgeon: Purcell Nails, MD;  Location: [facility] OR;  Service: Thoracic;  Laterality: Right;   Patient Active Problem  List   Diagnosis Date Noted   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   HNP (herniated nucleus pulposus), cervical 09/02/2018   Hx of colonic polyps 08/12/2017   MS (multiple sclerosis) (HCC) 06/03/2013   Follow-up examination following surgery 02/08/2013   Hemothorax on right 02/08/2013   Pleural effusion on right 12/31/2012   Fracture, ribs 12/31/2012   NICOTINE ADDICTION 05/24/2010   Dyslipidemia 03/27/2010   MULTIPLE SCLEROSIS, RELAPSING/REMITTING 03/27/2010   Allergic rhinitis 03/27/2010   GERD 03/27/2010    PCP: Rodrigo Ran, MD  REFERRING PROVIDER: Roanna Epley, MD  REFERRING DIAG: M62.89 Pelvic floor dysfunction  THERAPY DIAG:  Other lack of coordination - Plan: PT plan of care cert/re-cert  Cramp and spasm - Plan: PT plan of care cert/re-cert  Rationale for Evaluation and Treatment: Rehabilitation  ONSET DATE: 2023  SUBJECTIVE:  SUBJECTIVE STATEMENT: Need help with urination and ejaculation.  Fluid intake: all water PAIN:  Are you having pain? No  PRECAUTIONS: None  RED FLAGS: None   WEIGHT BEARING RESTRICTIONS: No  FALLS:  Has patient fallen in last 6 months? No  LIVING ENVIRONMENT: Lives with: lives with their spouse   OCCUPATION: lawyer  PLOF: Independent  PATIENT GOALS: improve with ejaculation, reduce hesitation   PERTINENT HISTORY:  MS; neurogenic bladder; Gastroparesis; Peripheral neuropathy   BOWEL MOVEMENT: none  URINATION: Pain with urination: No Fully empty bladder: Yes: will sit down due to taking a long time for stream to start.  Stream:  takes time for urine to come, weak stream,  dribbling afterwards.  Urgency: No Frequency: 4 hours Leakage: none Pads: No  INTERCOURSE: Pain with intercourse: none Climax: not during penile  penetration all the time Ejaculation: Yes: but needs more hand stimulation   OBJECTIVE:  Note: Objective measures were completed at Evaluation unless otherwise noted.  DIAGNOSTIC FINDINGS:  none    COGNITION: Overall cognitive status:  trouble with memory        POSTURE: No Significant postural limitations  PELVIC ALIGNMENT:  LUMBARAROM/PROM:Lumbar ROM is full    LOWER EXTREMITY AROM/PROM: Bilateral ROM is full   LOWER EXTREMITY MMT: Bilateral hip strength is 5/5   PALPATION:  Patient confirms identification and approves PT to assess internal pelvic floor and treatment No  PELVIC MMT:   MMT eval  Internal Anal Sphincter   External Anal Sphincter   Puborectalis   Diastasis Recti   (Blank rows = not tested)  TONE: Increased Used the RUSI with the ultasound head above the pubic bone on the pelvic floor setting working on pelvic floor contraction and bulging. He is able to bulge the pelvic floor but only move the pelvic floor with contraction 1mm due to the increased tone of the pelvic floor.   TODAY'S TREATMENT:                                                                                                                              DATE: 01/26/24  EVAL see below   PATIENT EDUCATION:  Education details: Access Code: [email or web address], educated patient on how the pelvic floor needs to relax to let the urine come out of the urethra and with ejaculation.  Person educated: Patient Education method: Explanation, Demonstration, Tactile cues, Verbal cues, and Handouts Education comprehension: verbalized understanding, returned demonstration, verbal cues required, tactile cues required, and needs further education  HOME EXERCISE PROGRAM: 01/26/24 Access Code: [email or web address] URL: [email or web address] Date: 01/26/2024 Prepared by: Eulis Foster  Exercises - Seated Diaphragmatic Breathing  - 2 x daily - 7 x weekly - 1 sets - 10 reps  ASSESSMENT:  CLINICAL  IMPRESSION: Patient is a 58 y.o. male who was seen today for physical therapy evaluation and treatment for pelvic floor dysfunction. Patient has multiple sclerosis. He is having difficulty with urinating in standing and it is delayed. He  needs to sit down for urinating and takes several minutes for the urine flow to start. He will dribble after urination. He is only able to move the pelvic floor 1 mm and will bulge the pelvic floor. He will initially bulge her lower abdomen to contract the pelvic floor. Patient has difficulty with ejaculation while in the vaginal canal and will have to use hand stimulation outside the vaginal canal. Patient will benefit from skilled therapy to work on pelvic floor relaxation and reduce the delay of urination.    OBJECTIVE IMPAIRMENTS: decreased coordination, decreased strength, and increased fascial restrictions.   ACTIVITY LIMITATIONS: toileting  PARTICIPATION LIMITATIONS: interpersonal relationship  PERSONAL FACTORS: 1 comorbidity: MS; neurogenic bladder; Gastroparesis; Peripheral neuropathy  are also affecting patient's functional outcome.   REHAB POTENTIAL: Good  CLINICAL DECISION MAKING: Evolving/moderate complexity  EVALUATION COMPLEXITY: Moderate   GOALS: Goals reviewed with patient? Yes  SHORT TERM GOALS: Target date: 02/23/24  Patient is able to perform diaphragmatic breathing to lengthen the pelvic floor for improved urine flow.  Baseline: Goal status: INITIAL  2.  Patient is independent with hip flexibility exercises to lengthen the pelvic floor.  Baseline:  Goal status: INITIAL  LONG TERM GOALS: Target date: 07/25/24  Patient independent with advanced HEP.  Baseline:  Goal status: INITIAL  2.  Patient is able to sit on the commode to and urinate within 1-2 minutes.  Baseline:  Goal status: INITIAL  3.  Patient is able to ejaculate in the vaginal canal due to the ability to contract the pelvic floor >/= 5 mm.  Baseline:  Goal  status: INITIAL  4.  Patient is able to urinate without dribbling 50% of the time due to improved pelvic floor coordination.  Baseline:  Goal status: INITIAL    PLAN:  PT FREQUENCY: 1-2x/week  PT DURATION: 6 months  PLANNED INTERVENTIONS: 97110-Therapeutic exercises, 97530- Therapeutic activity, 97112- Neuromuscular re-education, 97535- Self Care, 16109- Manual therapy, G0283- Electrical stimulation (unattended), [phone]- Electrical stimulation (manual), Patient/Family education, Dry Needling, Joint mobilization, Spinal mobilization, Cryotherapy, Moist heat, and Biofeedback  PLAN FOR NEXT SESSION: Manual work to the fascial around the pelvic floor, sit on a ball to massage the pelvic floor, diaphragmatic breathing   Eulis Foster, PT 01/26/24 1:18 PM

## 2024-01-26 ENCOUNTER — Other Ambulatory Visit: Payer: Self-pay

## 2024-01-26 ENCOUNTER — Ambulatory Visit: Payer: BC Managed Care – PPO | Attending: Urology | Admitting: Physical Therapy

## 2024-01-26 ENCOUNTER — Encounter: Payer: Self-pay | Admitting: Physical Therapy

## 2024-01-26 DIAGNOSIS — R278 Other lack of coordination: Secondary | ICD-10-CM | POA: Insufficient documentation

## 2024-01-26 DIAGNOSIS — R252 Cramp and spasm: Secondary | ICD-10-CM | POA: Diagnosis not present

## 2024-01-27 DIAGNOSIS — C8400 Mycosis fungoides, unspecified site: Secondary | ICD-10-CM | POA: Diagnosis not present

## 2024-02-02 ENCOUNTER — Ambulatory Visit: Payer: BC Managed Care – PPO | Admitting: Physical Therapy

## 2024-02-02 ENCOUNTER — Encounter: Payer: Self-pay | Admitting: Physical Therapy

## 2024-02-02 DIAGNOSIS — R252 Cramp and spasm: Secondary | ICD-10-CM

## 2024-02-02 DIAGNOSIS — R278 Other lack of coordination: Secondary | ICD-10-CM

## 2024-02-02 NOTE — Therapy (Signed)
 OUTPATIENT PHYSICAL THERAPY MALE PELVIC TREATMENT   Patient Name: [name] MRN: [id number] DOB:19-Aug-1966, 58 y.o., male Today's Date: 02/02/2024  END OF SESSION:  PT End of Session - 02/02/24 1616     Visit Number 2    Date for PT Re-Evaluation 07/25/24    Authorization Type BCBS    Authorization Time Period 01/26/2024 - 03/25/2024    Authorization - Visit Number 2    Authorization - Number of Visits 5    PT Start Time 1615    PT Stop Time 1655    PT Time Calculation (min) 40 min    Activity Tolerance Patient tolerated treatment well    Behavior During Therapy [facility] for tasks assessed/performed             Past Medical History:  Diagnosis Date   ALLERGIC RHINITIS 03/27/2010   Fracture, ribs 12/31/2012   Non-displaced fracture of right 8th-11th ribs   Gait disorder    Gastroparesis    GERD 03/27/2010   Hx of adenomatous polyp of colon 08/12/2017   Insomnia    MULTIPLE SCLEROSIS, RELAPSING/REMITTING 03/27/2010   Neurogenic bladder    NICOTINE ADDICTION 05/24/2010   Peripheral neuropathy    Pleural effusion on right 12/31/2012   Secondary to rib fractures   PONV (postoperative nausea and vomiting)    Rib fracture 11/2012   TMJ (dislocation of temporomandibular joint)    Past Surgical History:  Procedure Laterality Date   ANKLE SURGERY Left    ANTERIOR CERVICAL DECOMP/DISCECTOMY FUSION N/A 09/02/2018   Procedure: ANTERIOR CERVICAL DECOMPRESSION/DISCECTOMY FUSION CERVICAL FIVE- CERVICAL SIX;  Surgeon: Shirlean Kelly, MD;  Location: [facility] OR;  Service: Neurosurgery;  Laterality: N/A;   COLONOSCOPY     FOOT SURGERY Right    INCISION AND DRAINAGE OF WOUND  2015   LUMBAR LAMINECTOMY/DECOMPRESSION MICRODISCECTOMY Left 11/25/2018   Procedure: LEFT LUMBAR THREE- LUMBAR FOUR EXTRAFORAMINAL MICRODISCECTOMY;  Surgeon: Shirlean Kelly, MD;  Location: [facility] OR;  Service: Neurosurgery;  Laterality: Left;   PLEURAL EFFUSION DRAINAGE  01/01/2013   Procedure: DRAINAGE OF PLEURAL  EFFUSION;  Surgeon: Purcell Nails, MD;  Location: [facility] OR;  Service: Thoracic;  Laterality: Right;   Patient Active Problem List   Diagnosis Date Noted   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   HNP (herniated nucleus pulposus), cervical 09/02/2018   Hx of colonic polyps 08/12/2017   MS (multiple sclerosis) (HCC) 06/03/2013   Follow-up examination following surgery 02/08/2013   Hemothorax on right 02/08/2013   Pleural effusion on right 12/31/2012   Fracture, ribs 12/31/2012   NICOTINE ADDICTION 05/24/2010   Dyslipidemia 03/27/2010   MULTIPLE SCLEROSIS, RELAPSING/REMITTING 03/27/2010   Allergic rhinitis 03/27/2010   GERD 03/27/2010    PCP: Rodrigo Ran, MD  REFERRING PROVIDER: Roanna Epley, MD  REFERRING DIAG: M62.89 Pelvic floor dysfunction  THERAPY DIAG:  Other lack of coordination  Cramp and spasm  Rationale for Evaluation and Treatment: Rehabilitation  ONSET DATE: 2023  SUBJECTIVE:  SUBJECTIVE STATEMENT: No questions.  Fluid intake: all water PAIN:  Are you having pain? No  PRECAUTIONS: None  RED FLAGS: None   WEIGHT BEARING RESTRICTIONS: No  FALLS:  Has patient fallen in last 6 months? No  LIVING ENVIRONMENT: Lives with: lives with their spouse   OCCUPATION: lawyer  PLOF: Independent  PATIENT GOALS: improve with ejaculation, reduce hesitation   PERTINENT HISTORY:  MS; neurogenic bladder; Gastroparesis; Peripheral neuropathy   BOWEL MOVEMENT: none  URINATION: Pain with urination: No Fully empty bladder: Yes: will sit down due to taking a long time for stream to start.  Stream:  takes time for urine to come, weak stream,  dribbling afterwards.  Urgency: No Frequency: 4 hours Leakage: none Pads: No  INTERCOURSE: Pain with intercourse: none Climax: not  during penile penetration all the time Ejaculation: Yes: but needs more hand stimulation   OBJECTIVE:  Note: Objective measures were completed at Evaluation unless otherwise noted.  DIAGNOSTIC FINDINGS:  none    COGNITION: Overall cognitive status:  trouble with memory        POSTURE: No Significant postural limitations  PELVIC ALIGNMENT:  LUMBARAROM/PROM:Lumbar ROM is full    LOWER EXTREMITY AROM/PROM: Bilateral ROM is full   LOWER EXTREMITY MMT: Bilateral hip strength is 5/5   PALPATION:  Patient confirms identification and approves PT to assess internal pelvic floor and treatment No  PELVIC MMT:   MMT eval  Internal Anal Sphincter   External Anal Sphincter   Puborectalis   Diastasis Recti   (Blank rows = not tested)  TONE: Increased Used the RUSI with the ultasound head above the pubic bone on the pelvic floor setting working on pelvic floor contraction and bulging. He is able to bulge the pelvic floor but only move the pelvic floor with contraction 1mm due to the increased tone of the pelvic floor.   TODAY'S TREATMENT:   02/02/24 Manual: Soft tissue mobilization: Manual work to the left diaphragm to prepare for diaphragmatic breathing.  Neuromuscular re-education: Down training: Diaphragmatic breathing in supine with relaxing the pelvic floor using images, and verbal cues to reduce the tension Sit on ball and perform diaphragmatic breathing to feel the pelvic floor relax Cat cow while sitting on pelvic floor to relax it, pelvic circles, and pelvic rock Sit on ball and perform pelvic rock, pelvic circles, pelvic sway to do on the commode to relax the pelvic floor Sit on different size balls to massage the pelvic floor to see which one is more comfortable.  Exercises: Stretches/mobility: Pigeon pose but difficult to get in position Supine piriformis stretch hold 30 sec bil.  Quadruped rock with hips internally rotated working on elongation of the  pelvic floor      PATIENT EDUCATION:  02/02/24 Education details: Access Code: [email or web address], educated patient on how the pelvic floor needs to relax to let the urine come out of the urethra and with ejaculation.  Person educated: Patient Education method: Explanation, Demonstration, Tactile cues, Verbal cues, and Handouts Education comprehension: verbalized understanding, returned demonstration, verbal cues required, tactile cues required, and needs further education  HOME EXERCISE PROGRAM: 02/02/24 Access Code: [email or web address] URL: [email or web address] Date: 02/02/2024 Prepared by: Eulis Foster  Program Notes sit on a ball to massage the pelvic floor from the testicle to the anus.   Exercises - Seated Diaphragmatic Breathing  - 2 x daily - 7 x weekly - 1 sets - 10 reps - Supine Diaphragmatic Breathing  - 1 x daily - 7 x weekly - 3  sets - 10 reps - Seated Swiss Ball Pelvic Circles  - 1 x daily - 7 x weekly - 3 sets - 10 reps - Seated Cat Cow  - 1 x daily - 7 x weekly - 3 sets - 10 reps - Seated Lateral Pelvic Tilt on Swiss Ball  - 1 x daily - 7 x weekly - 3 sets - 10 reps - Quadruped Rocking Slow  - 1 x daily - 3 x weekly - 1 sets - 10 reps - Supine Figure 4 Piriformis Stretch with Leg Extension  - 1 x daily - 3 x weekly - 1 sets - 2 reps - 30sec hold   ASSESSMENT:  CLINICAL IMPRESSION: Patient is a 58 y.o. male who was seen today for physical therapy  treatment for pelvic floor dysfunction. Patient has multiple sclerosis. Patient has learned ways to relax the pelvic floor while sitting on the commode. He was able to relax the pelvic floor better when sitting on the ball and feel it drop. He is learning hip stretches that will indirectly elongate the pelvic floor.  Patient will benefit from skilled therapy to work on pelvic floor relaxation and reduce the delay of urination.    OBJECTIVE IMPAIRMENTS: decreased coordination, decreased strength, and increased fascial restrictions.    ACTIVITY LIMITATIONS: toileting  PARTICIPATION LIMITATIONS: interpersonal relationship  PERSONAL FACTORS: 1 comorbidity: MS; neurogenic bladder; Gastroparesis; Peripheral neuropathy  are also affecting patient's functional outcome.   REHAB POTENTIAL: Good  CLINICAL DECISION MAKING: Evolving/moderate complexity  EVALUATION COMPLEXITY: Moderate   GOALS: Goals reviewed with patient? Yes  SHORT TERM GOALS: Target date: 02/23/24  Patient is able to perform diaphragmatic breathing to lengthen the pelvic floor for improved urine flow.  Baseline: Goal status: INITIAL  2.  Patient is independent with hip flexibility exercises to lengthen the pelvic floor.  Baseline:  Goal status: INITIAL  LONG TERM GOALS: Target date: 07/25/24  Patient independent with advanced HEP.  Baseline:  Goal status: INITIAL  2.  Patient is able to sit on the commode to and urinate within 1-2 minutes.  Baseline:  Goal status: INITIAL  3.  Patient is able to ejaculate in the vaginal canal due to the ability to contract the pelvic floor >/= 5 mm.  Baseline:  Goal status: INITIAL  4.  Patient is able to urinate without dribbling 50% of the time due to improved pelvic floor coordination.  Baseline:  Goal status: INITIAL    PLAN:  PT FREQUENCY: 1-2x/week  PT DURATION: 6 months  PLANNED INTERVENTIONS: 97110-Therapeutic exercises, 97530- Therapeutic activity, 97112- Neuromuscular re-education, 97535- Self Care, 09811- Manual therapy, G0283- Electrical stimulation (unattended), [phone]- Electrical stimulation (manual), Patient/Family education, Dry Needling, Joint mobilization, Spinal mobilization, Cryotherapy, Moist heat, and Biofeedback  PLAN FOR NEXT SESSION: Manual work to the fascial around the pelvic floor,    Eulis Foster, PT 02/02/24 5:07 PM

## 2024-02-03 DIAGNOSIS — C8400 Mycosis fungoides, unspecified site: Secondary | ICD-10-CM | POA: Diagnosis not present

## 2024-02-04 DIAGNOSIS — G894 Chronic pain syndrome: Secondary | ICD-10-CM | POA: Diagnosis not present

## 2024-02-09 ENCOUNTER — Ambulatory Visit: Payer: BC Managed Care – PPO | Attending: Urology | Admitting: Physical Therapy

## 2024-02-09 ENCOUNTER — Encounter: Payer: Self-pay | Admitting: Physical Therapy

## 2024-02-09 DIAGNOSIS — R252 Cramp and spasm: Secondary | ICD-10-CM | POA: Diagnosis not present

## 2024-02-09 DIAGNOSIS — R278 Other lack of coordination: Secondary | ICD-10-CM | POA: Diagnosis not present

## 2024-02-09 NOTE — Therapy (Signed)
 OUTPATIENT PHYSICAL THERAPY MALE PELVIC TREATMENT   Patient Name: [name] MRN: [id number] DOB:1966/10/06, 58 y.o., male Today's Date: 02/09/2024  END OF SESSION:  PT End of Session - 02/09/24 1617     Visit Number 3    Date for PT Re-Evaluation 07/25/24    Authorization Type BCBS    Authorization Time Period 01/26/2024 - 03/25/2024    Authorization - Visit Number 3    Authorization - Number of Visits 5    PT Start Time 1615    PT Stop Time 1655    PT Time Calculation (min) 40 min    Activity Tolerance Patient tolerated treatment well    Behavior During Therapy [facility] for tasks assessed/performed             Past Medical History:  Diagnosis Date   ALLERGIC RHINITIS 03/27/2010   Fracture, ribs 12/31/2012   Non-displaced fracture of right 8th-11th ribs   Gait disorder    Gastroparesis    GERD 03/27/2010   Hx of adenomatous polyp of colon 08/12/2017   Insomnia    MULTIPLE SCLEROSIS, RELAPSING/REMITTING 03/27/2010   Neurogenic bladder    NICOTINE ADDICTION 05/24/2010   Peripheral neuropathy    Pleural effusion on right 12/31/2012   Secondary to rib fractures   PONV (postoperative nausea and vomiting)    Rib fracture 11/2012   TMJ (dislocation of temporomandibular joint)    Past Surgical History:  Procedure Laterality Date   ANKLE SURGERY Left    ANTERIOR CERVICAL DECOMP/DISCECTOMY FUSION N/A 09/02/2018   Procedure: ANTERIOR CERVICAL DECOMPRESSION/DISCECTOMY FUSION CERVICAL FIVE- CERVICAL SIX;  Surgeon: Shirlean Kelly, MD;  Location: [facility] OR;  Service: Neurosurgery;  Laterality: N/A;   COLONOSCOPY     FOOT SURGERY Right    INCISION AND DRAINAGE OF WOUND  2015   LUMBAR LAMINECTOMY/DECOMPRESSION MICRODISCECTOMY Left 11/25/2018   Procedure: LEFT LUMBAR THREE- LUMBAR FOUR EXTRAFORAMINAL MICRODISCECTOMY;  Surgeon: Shirlean Kelly, MD;  Location: [facility] OR;  Service: Neurosurgery;  Laterality: Left;   PLEURAL EFFUSION DRAINAGE  01/01/2013   Procedure: DRAINAGE OF PLEURAL  EFFUSION;  Surgeon: Purcell Nails, MD;  Location: [facility] OR;  Service: Thoracic;  Laterality: Right;   Patient Active Problem List   Diagnosis Date Noted   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   HNP (herniated nucleus pulposus), cervical 09/02/2018   Hx of colonic polyps 08/12/2017   MS (multiple sclerosis) (HCC) 06/03/2013   Follow-up examination following surgery 02/08/2013   Hemothorax on right 02/08/2013   Pleural effusion on right 12/31/2012   Fracture, ribs 12/31/2012   NICOTINE ADDICTION 05/24/2010   Dyslipidemia 03/27/2010   MULTIPLE SCLEROSIS, RELAPSING/REMITTING 03/27/2010   Allergic rhinitis 03/27/2010   GERD 03/27/2010    PCP: Rodrigo Ran, MD  REFERRING PROVIDER: Roanna Epley, MD  REFERRING DIAG: M62.89 Pelvic floor dysfunction  THERAPY DIAG:  Other lack of coordination  Cramp and spasm  Rationale for Evaluation and Treatment: Rehabilitation  ONSET DATE: 2023  SUBJECTIVE:  SUBJECTIVE STATEMENT: I am doing the diaphragmatic breathing and now easier to initiate the urine stream. I still dribble after urination.  Fluid intake: all water PAIN:  Are you having pain? No  PRECAUTIONS: None  RED FLAGS: None   WEIGHT BEARING RESTRICTIONS: No  FALLS:  Has patient fallen in last 6 months? No  LIVING ENVIRONMENT: Lives with: lives with their spouse   OCCUPATION: lawyer  PLOF: Independent  PATIENT GOALS: improve with ejaculation, reduce hesitation   PERTINENT HISTORY:  MS; neurogenic bladder; Gastroparesis; Peripheral neuropathy   BOWEL MOVEMENT: none  URINATION: Pain with urination: No Fully empty bladder: Yes: will sit down due to taking a long time for stream to start.  Stream:  takes time for urine to come, weak stream,  dribbling afterwards.  Urgency:  No Frequency: 4 hours Leakage: none Pads: No  INTERCOURSE: Pain with intercourse: none; uses a pump to get an erection but trouble with ejaculation Climax: not during penile penetration all the time Ejaculation: Yes: but needs more hand stimulation   OBJECTIVE:  Note: Objective measures were completed at Evaluation unless otherwise noted.  DIAGNOSTIC FINDINGS:  none    COGNITION: Overall cognitive status:  trouble with memory        POSTURE: No Significant postural limitations  PELVIC ALIGNMENT:  LUMBARAROM/PROM:Lumbar ROM is full    LOWER EXTREMITY AROM/PROM: Bilateral ROM is full   LOWER EXTREMITY MMT: Bilateral hip strength is 5/5   PALPATION:  Patient confirms identification and approves PT to assess internal pelvic floor and treatment No  PELVIC MMT:   MMT eval  Internal Anal Sphincter   External Anal Sphincter   Puborectalis   Diastasis Recti   (Blank rows = not tested)  TONE: Increased Used the RUSI with the ultasound head above the pubic bone on the pelvic floor setting working on pelvic floor contraction and bulging. He is able to bulge the pelvic floor but only move the pelvic floor with contraction 1mm due to the increased tone of the pelvic floor.   TODAY'S TREATMENT:   02/09/24 Manual: Soft tissue mobilization: Manual work to the bulbocavernosus and ischiocavernosus.  Myofascial release: Fascial release of the urogenital diaphragm going through the layers of the restrictions Neuromuscular re-education: Pelvic floor contraction training: Therapist hand on the urogenital diaphragm working with patient to contract the pelvic floor, not contract the gluteal and how to isolate the contraction.  Down training: Sitting on different balls to massage the pelvic floor to see if it can relax the pelvic floor Sitting diaphragmatic breathing and feeling the pelvic floor relax into the mat.     02/02/24 Manual: Soft tissue mobilization: Manual work  to the left diaphragm to prepare for diaphragmatic breathing.  Neuromuscular re-education: Down training: Diaphragmatic breathing in supine with relaxing the pelvic floor using images, and verbal cues to reduce the tension Sit on ball and perform diaphragmatic breathing to feel the pelvic floor relax Cat cow while sitting on pelvic floor to relax it, pelvic circles, and pelvic rock Sit on ball and perform pelvic rock, pelvic circles, pelvic sway to do on the commode to relax the pelvic floor Sit on different size balls to massage the pelvic floor to see which one is more comfortable.  Exercises: Stretches/mobility: Pigeon pose but difficult to get in position Supine piriformis stretch hold 30 sec bil.  Quadruped rock with hips internally rotated working on elongation of the pelvic floor      PATIENT EDUCATION:  02/09/24 Education details: Access Code: [email or web address],  educated patient on how the pelvic floor needs to relax to let the urine come out of the urethra and with ejaculation.  Person educated: Patient Education method: Explanation, Demonstration, Tactile cues, Verbal cues, and Handouts Education comprehension: verbalized understanding, returned demonstration, verbal cues required, tactile cues required, and needs further education  HOME EXERCISE PROGRAM: 02/09/24 Access Code: [email or web address] URL: [email or web address] Date: 02/09/2024 Prepared by: Eulis Foster  Exercises - Seated Diaphragmatic Breathing  - 2 x daily - 7 x weekly - 1 sets - 10 reps - Seated Swiss Ball Pelvic Circles  - 1 x daily - 7 x weekly - 3 sets - 10 reps - Seated Cat Cow  - 1 x daily - 7 x weekly - 3 sets - 10 reps - Seated Lateral Pelvic Tilt on Swiss Ball  - 1 x daily - 7 x weekly - 3 sets - 10 reps - Quadruped Rocking Slow  - 1 x daily - 3 x weekly - 1 sets - 10 reps - Supine Figure 4 Piriformis Stretch with Leg Extension  - 1 x daily - 3 x weekly - 1 sets - 2 reps - 30sec hold - Seated Pelvic Floor  Contraction  - 2 x daily - 7 x weekly - 1 sets - 10 reps - 10 sec hold   ASSESSMENT:  CLINICAL IMPRESSION: Patient is a 59 y.o. male who was seen today for physical therapy  treatment for pelvic floor dysfunction. Patient has multiple sclerosis. He is able to start his urine stream with greater ease. Patient is able to feel the pelvic floor relax into the mat in sitting now. He was able to isolate the contraction by the end of the treatment. The left side of the pelvic floor was tighter than the right.  He continues to dribble after urination. Patient will benefit from skilled therapy to work on pelvic floor relaxation and reduce the delay of urination.    OBJECTIVE IMPAIRMENTS: decreased coordination, decreased strength, and increased fascial restrictions.   ACTIVITY LIMITATIONS: toileting  PARTICIPATION LIMITATIONS: interpersonal relationship  PERSONAL FACTORS: 1 comorbidity: MS; neurogenic bladder; Gastroparesis; Peripheral neuropathy  are also affecting patient's functional outcome.   REHAB POTENTIAL: Good  CLINICAL DECISION MAKING: Evolving/moderate complexity  EVALUATION COMPLEXITY: Moderate   GOALS: Goals reviewed with patient? Yes  SHORT TERM GOALS: Target date: 02/23/24  Patient is able to perform diaphragmatic breathing to lengthen the pelvic floor for improved urine flow.  Baseline: Goal status: Met 02/09/24  2.  Patient is independent with hip flexibility exercises to lengthen the pelvic floor.  Baseline:  Goal status: Met 02/09/24  LONG TERM GOALS: Target date: 07/25/24  Patient independent with advanced HEP.  Baseline:  Goal status: INITIAL  2.  Patient is able to sit on the commode to and urinate within 1-2 minutes.  Baseline:  Goal status: INITIAL  3.  Patient is able to ejaculate in the vaginal canal due to the ability to contract the pelvic floor >/= 5 mm.  Baseline:  Goal status: INITIAL  4.  Patient is able to urinate without dribbling 50% of the time  due to improved pelvic floor coordination.  Baseline:  Goal status: INITIAL    PLAN:  PT FREQUENCY: 1-2x/week  PT DURATION: 6 months  PLANNED INTERVENTIONS: 97110-Therapeutic exercises, 97530- Therapeutic activity, [id number]- Neuromuscular re-education, 97535- Self Care, 16109- Manual therapy, [id number]- Electrical stimulation (unattended), [phone]- Electrical stimulation (manual), Patient/Family education, Dry Needling, Joint mobilization, Spinal mobilization, Cryotherapy, Moist heat, and Biofeedback  PLAN FOR NEXT SESSION: pelvic  floor contraction and strengthening.    Eulis Foster, PT 02/09/24 5:11 PM

## 2024-02-11 DIAGNOSIS — C8400 Mycosis fungoides, unspecified site: Secondary | ICD-10-CM | POA: Diagnosis not present

## 2024-02-14 ENCOUNTER — Other Ambulatory Visit (HOSPITAL_COMMUNITY): Payer: Self-pay

## 2024-02-14 ENCOUNTER — Other Ambulatory Visit (HOSPITAL_BASED_OUTPATIENT_CLINIC_OR_DEPARTMENT_OTHER): Payer: Self-pay

## 2024-02-16 ENCOUNTER — Other Ambulatory Visit (HOSPITAL_COMMUNITY): Payer: Self-pay

## 2024-02-16 ENCOUNTER — Ambulatory Visit: Payer: Self-pay | Admitting: Physical Therapy

## 2024-02-16 ENCOUNTER — Encounter: Payer: Self-pay | Admitting: Physical Therapy

## 2024-02-16 DIAGNOSIS — R278 Other lack of coordination: Secondary | ICD-10-CM

## 2024-02-16 DIAGNOSIS — R252 Cramp and spasm: Secondary | ICD-10-CM

## 2024-02-16 MED ORDER — IPRATROPIUM BROMIDE 0.06 % NA SOLN
2.0000 | Freq: Three times a day (TID) | NASAL | 5 refills | Status: AC
  Filled 2024-02-16 – 2024-07-03 (×2): qty 15, 25d supply, fill #0

## 2024-02-16 NOTE — Therapy (Signed)
 OUTPATIENT PHYSICAL THERAPY MALE PELVIC TREATMENT   Patient Name: [name] MRN: [id number] DOB:[birth year], 58 y.o., male Today's Date: 02/16/2024  END OF SESSION:  PT End of Session - 02/16/24 1617     Visit Number 4    Date for PT Re-Evaluation 07/25/24    Authorization Type BCBS    Authorization Time Period 01/26/2024 - 03/25/2024    Authorization - Visit Number 4    Authorization - Number of Visits 5    PT Start Time 1615    PT Stop Time 1655    PT Time Calculation (min) 40 min    Activity Tolerance Patient tolerated treatment well    Behavior During Therapy [facility] for tasks assessed/performed             Past Medical History:  Diagnosis Date   ALLERGIC RHINITIS 03/27/2010   Fracture, ribs 12/31/2012   Non-displaced fracture of right 8th-11th ribs   Gait disorder    Gastroparesis    GERD 03/27/2010   Hx of adenomatous polyp of colon 08/12/2017   Insomnia    MULTIPLE SCLEROSIS, RELAPSING/REMITTING 03/27/2010   Neurogenic bladder    NICOTINE ADDICTION 05/24/2010   Peripheral neuropathy    Pleural effusion on right 12/31/2012   Secondary to rib fractures   PONV (postoperative nausea and vomiting)    Rib fracture 11/2012   TMJ (dislocation of temporomandibular joint)    Past Surgical History:  Procedure Laterality Date   ANKLE SURGERY Left    ANTERIOR CERVICAL DECOMP/DISCECTOMY FUSION N/A 09/02/2018   Procedure: ANTERIOR CERVICAL DECOMPRESSION/DISCECTOMY FUSION CERVICAL FIVE- CERVICAL SIX;  Surgeon: Shirlean Kelly, MD;  Location: [facility] OR;  Service: Neurosurgery;  Laterality: N/A;   COLONOSCOPY     FOOT SURGERY Right    INCISION AND DRAINAGE OF WOUND  2015   LUMBAR LAMINECTOMY/DECOMPRESSION MICRODISCECTOMY Left 11/25/2018   Procedure: LEFT LUMBAR THREE- LUMBAR FOUR EXTRAFORAMINAL MICRODISCECTOMY;  Surgeon: Shirlean Kelly, MD;  Location: [facility] OR;  Service: Neurosurgery;  Laterality: Left;   PLEURAL EFFUSION DRAINAGE  01/01/2013   Procedure: DRAINAGE OF PLEURAL  EFFUSION;  Surgeon: Purcell Nails, MD;  Location: [facility] OR;  Service: Thoracic;  Laterality: Right;   Patient Active Problem List   Diagnosis Date Noted   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   HNP (herniated nucleus pulposus), cervical 09/02/2018   Hx of colonic polyps 08/12/2017   MS (multiple sclerosis) (HCC) 06/03/2013   Follow-up examination following surgery 02/08/2013   Hemothorax on right 02/08/2013   Pleural effusion on right 12/31/2012   Fracture, ribs 12/31/2012   NICOTINE ADDICTION 05/24/2010   Dyslipidemia 03/27/2010   MULTIPLE SCLEROSIS, RELAPSING/REMITTING 03/27/2010   Allergic rhinitis 03/27/2010   GERD 03/27/2010    PCP: Rodrigo Ran, MD  REFERRING PROVIDER: Roanna Epley, MD  REFERRING DIAG: M62.89 Pelvic floor dysfunction  THERAPY DIAG:  Other lack of coordination  Cramp and spasm  Rationale for Evaluation and Treatment: Rehabilitation  ONSET DATE: 2023  SUBJECTIVE:  SUBJECTIVE STATEMENT: I am feeling better. There is no lag to urinate. I still dribble after urination. Still have the same issues with sex.   Fluid intake: all water PAIN:  Are you having pain? No  PRECAUTIONS: None  RED FLAGS: None   WEIGHT BEARING RESTRICTIONS: No  FALLS:  Has patient fallen in last 6 months? No  LIVING ENVIRONMENT: Lives with: lives with their spouse   OCCUPATION: lawyer  PLOF: Independent  PATIENT GOALS: improve with ejaculation, reduce hesitation   PERTINENT HISTORY:  MS; neurogenic bladder; Gastroparesis; Peripheral neuropathy   BOWEL MOVEMENT: none  URINATION: Pain with urination: No Fully empty bladder: Yes: will sit down due to taking a long time for stream to start.  Stream:  takes time for urine to come, weak stream,  dribbling afterwards.   Urgency: No Frequency: 4 hours Leakage: none Pads: No  INTERCOURSE: Pain with intercourse: none; uses a pump to get an erection but trouble with ejaculation Climax: not during penile penetration all the time Ejaculation: Yes: but needs more hand stimulation   OBJECTIVE:  Note: Objective measures were completed at Evaluation unless otherwise noted.  DIAGNOSTIC FINDINGS:  none    COGNITION: Overall cognitive status:  trouble with memory        POSTURE: No Significant postural limitations  PELVIC ALIGNMENT:  LUMBARAROM/PROM:Lumbar ROM is full    LOWER EXTREMITY AROM/PROM: Bilateral ROM is full   LOWER EXTREMITY MMT: Bilateral hip strength is 5/5   PALPATION:  Patient confirms identification and approves PT to assess internal pelvic floor and treatment No  PELVIC MMT:   MMT eval  Internal Anal Sphincter   External Anal Sphincter   Puborectalis   Diastasis Recti   (Blank rows = not tested)  TONE: Increased Used the RUSI with the ultasound head above the pubic bone on the pelvic floor setting working on pelvic floor contraction and bulging. He is able to bulge the pelvic floor but only move the pelvic floor with contraction 1mm due to the increased tone of the pelvic floor.   TODAY'S TREATMENT:   02/16/24 Neuromuscular re-education: Pelvic floor contraction training: RUSI to the pelvic floor above the pubic bone working on pelvic floor contraction Patient had difficulty with lifting his pelvic floor due to bearing down and holding his breath. He needed many cues to lift the pelvic floor and needed verbal cues to contract the anus.  Supine contract the pelvic floor with hip isometric would bulge down instead Patient was able to move the pelvic floor 8 mm Exercises: Strengthening: Sitting pelvic floor contraction holding 5 sec 10 x  Sit to stand with pelvic floor contraction with VC to put more weight on the forefoot    02/09/24 Manual: Soft tissue  mobilization: Manual work to the bulbocavernosus and ischiocavernosus.  Myofascial release: Fascial release of the urogenital diaphragm going through the layers of the restrictions Neuromuscular re-education: Pelvic floor contraction training: Therapist hand on the urogenital diaphragm working with patient to contract the pelvic floor, not contract the gluteal and how to isolate the contraction.  Down training: Sitting on different balls to massage the pelvic floor to see if it can relax the pelvic floor Sitting diaphragmatic breathing and feeling the pelvic floor relax into the mat.     02/02/24 Manual: Soft tissue mobilization: Manual work to the left diaphragm to prepare for diaphragmatic breathing.  Neuromuscular re-education: Down training: Diaphragmatic breathing in supine with relaxing the pelvic floor using images, and verbal cues to reduce the tension  Sit on ball and perform diaphragmatic breathing to feel the pelvic floor relax Cat cow while sitting on pelvic floor to relax it, pelvic circles, and pelvic rock Sit on ball and perform pelvic rock, pelvic circles, pelvic sway to do on the commode to relax the pelvic floor Sit on different size balls to massage the pelvic floor to see which one is more comfortable.  Exercises: Stretches/mobility: Pigeon pose but difficult to get in position Supine piriformis stretch hold 30 sec bil.  Quadruped rock with hips internally rotated working on elongation of the pelvic floor      PATIENT EDUCATION:  02/09/24 Education details: Access Code: [email or web address], educated patient on how the pelvic floor needs to relax to let the urine come out of the urethra and with ejaculation.  Person educated: Patient Education method: Explanation, Demonstration, Tactile cues, Verbal cues, and Handouts Education comprehension: verbalized understanding, returned demonstration, verbal cues required, tactile cues required, and needs further education  HOME  EXERCISE PROGRAM: 02/09/24 Access Code: [email or web address] URL: [email or web address] Date: 02/09/2024 Prepared by: Eulis Foster  Exercises - Seated Diaphragmatic Breathing  - 2 x daily - 7 x weekly - 1 sets - 10 reps - Seated Swiss Ball Pelvic Circles  - 1 x daily - 7 x weekly - 3 sets - 10 reps - Seated Cat Cow  - 1 x daily - 7 x weekly - 3 sets - 10 reps - Seated Lateral Pelvic Tilt on Swiss Ball  - 1 x daily - 7 x weekly - 3 sets - 10 reps - Quadruped Rocking Slow  - 1 x daily - 3 x weekly - 1 sets - 10 reps - Supine Figure 4 Piriformis Stretch with Leg Extension  - 1 x daily - 3 x weekly - 1 sets - 2 reps - 30sec hold - Seated Pelvic Floor Contraction  - 2 x daily - 7 x weekly - 1 sets - 10 reps - 10 sec hold   ASSESSMENT:  CLINICAL IMPRESSION: Patient is a 58 y.o. male who was seen today for physical therapy  treatment for pelvic floor dysfunction. Patient has multiple sclerosis. He is able to start his urine stream with greater ease.patient was able to contract his pelvic floor 8 mm compared to the other time was 1 mm. He needs many cues to contract the pelvic floor instead of bearing down and holding his breath. He can hold for 5 sec not 10 sec. He continues to dribble after urination. Patient will benefit from skilled therapy to work on pelvic floor relaxation and reduce the delay of urination.    OBJECTIVE IMPAIRMENTS: decreased coordination, decreased strength, and increased fascial restrictions.   ACTIVITY LIMITATIONS: toileting  PARTICIPATION LIMITATIONS: interpersonal relationship  PERSONAL FACTORS: 1 comorbidity: MS; neurogenic bladder; Gastroparesis; Peripheral neuropathy  are also affecting patient's functional outcome.   REHAB POTENTIAL: Good  CLINICAL DECISION MAKING: Evolving/moderate complexity  EVALUATION COMPLEXITY: Moderate   GOALS: Goals reviewed with patient? Yes  SHORT TERM GOALS: Target date: 02/23/24  Patient is able to perform diaphragmatic  breathing to lengthen the pelvic floor for improved urine flow.  Baseline: Goal status: Met 02/09/24  2.  Patient is independent with hip flexibility exercises to lengthen the pelvic floor.  Baseline:  Goal status: Met 02/09/24  LONG TERM GOALS: Target date: 07/25/24  Patient independent with advanced HEP.  Baseline:  Goal status: INITIAL  2.  Patient is able to sit on the commode to and urinate within 1-2 minutes.  Baseline:  Goal status:  Met 02/16/24  3.  Patient is able to ejaculate in the vaginal canal due to the ability to contract the pelvic floor >/= 5 mm.  Baseline:  Goal status: INITIAL  4.  Patient is able to urinate without dribbling 50% of the time due to improved pelvic floor coordination.  Baseline:  Goal status: INITIAL    PLAN:  PT FREQUENCY: 1-2x/week  PT DURATION: 6 months  PLANNED INTERVENTIONS: 97110-Therapeutic exercises, 97530- Therapeutic activity, [id number]- Neuromuscular re-education, 97535- Self Care, 16109- Manual therapy, [id number]- Electrical stimulation (unattended), [phone]- Electrical stimulation (manual), Patient/Family education, Dry Needling, Joint mobilization, Spinal mobilization, Cryotherapy, Moist heat, and Biofeedback  PLAN FOR NEXT SESSION: pelvic floor contraction and strengthening.    Eulis Foster, PT 02/16/24 5:06 PM

## 2024-02-17 ENCOUNTER — Other Ambulatory Visit (HOSPITAL_COMMUNITY): Payer: Self-pay

## 2024-02-17 DIAGNOSIS — C8400 Mycosis fungoides, unspecified site: Secondary | ICD-10-CM | POA: Diagnosis not present

## 2024-02-18 ENCOUNTER — Other Ambulatory Visit (HOSPITAL_COMMUNITY): Payer: Self-pay

## 2024-02-23 ENCOUNTER — Encounter: Payer: Self-pay | Admitting: Physical Therapy

## 2024-03-02 DIAGNOSIS — C8400 Mycosis fungoides, unspecified site: Secondary | ICD-10-CM | POA: Diagnosis not present

## 2024-03-04 ENCOUNTER — Other Ambulatory Visit (HOSPITAL_COMMUNITY): Payer: Self-pay

## 2024-03-04 DIAGNOSIS — C8400 Mycosis fungoides, unspecified site: Secondary | ICD-10-CM | POA: Diagnosis not present

## 2024-03-04 MED ORDER — SUZETRIGINE 50 MG PO TABS
1.0000 | ORAL_TABLET | Freq: Two times a day (BID) | ORAL | 0 refills | Status: DC
Start: 2024-01-13 — End: 2024-03-04
  Filled 2024-03-04: qty 120, 30d supply, fill #0

## 2024-03-31 ENCOUNTER — Ambulatory Visit: Payer: BC Managed Care – PPO | Admitting: Physical Therapy

## 2024-04-06 DIAGNOSIS — C8400 Mycosis fungoides, unspecified site: Secondary | ICD-10-CM | POA: Diagnosis not present

## 2024-04-07 ENCOUNTER — Encounter: Payer: BC Managed Care – PPO | Admitting: Physical Therapy

## 2024-04-07 ENCOUNTER — Ambulatory Visit: Attending: Urology | Admitting: Physical Therapy

## 2024-04-07 ENCOUNTER — Encounter: Payer: Self-pay | Admitting: Physical Therapy

## 2024-04-07 DIAGNOSIS — R278 Other lack of coordination: Secondary | ICD-10-CM | POA: Insufficient documentation

## 2024-04-07 DIAGNOSIS — R252 Cramp and spasm: Secondary | ICD-10-CM | POA: Insufficient documentation

## 2024-04-07 NOTE — Therapy (Signed)
 OUTPATIENT PHYSICAL THERAPY MALE PELVIC TREATMENT   Patient Name: [name] MRN: [id number] DOB:[birth year], 58 y.o., male Today's Date: 04/07/2024  END OF SESSION:  PT End of Session - 04/07/24 1226     Visit Number 5    Date for PT Re-Evaluation 07/25/24    Authorization Type BCBS    Authorization Time Period 03/31/2024 - 05/29/2024    Authorization - Visit Number 1    Authorization - Number of Visits 5    PT Start Time 1230    PT Stop Time 1310    PT Time Calculation (min) 40 min    Activity Tolerance Patient tolerated treatment well    Behavior During Therapy [facility] for tasks assessed/performed             Past Medical History:  Diagnosis Date   ALLERGIC RHINITIS 03/27/2010   Fracture, ribs 12/31/2012   Non-displaced fracture of right 8th-11th ribs   Gait disorder    Gastroparesis    GERD 03/27/2010   Hx of adenomatous polyp of colon 08/12/2017   Insomnia    MULTIPLE SCLEROSIS, RELAPSING/REMITTING 03/27/2010   Neurogenic bladder    NICOTINE  ADDICTION 05/24/2010   Peripheral neuropathy    Pleural effusion on right 12/31/2012   Secondary to rib fractures   PONV (postoperative nausea and vomiting)    Rib fracture 11/2012   TMJ (dislocation of temporomandibular joint)    Past Surgical History:  Procedure Laterality Date   ANKLE SURGERY Left    ANTERIOR CERVICAL DECOMP/DISCECTOMY FUSION N/A 09/02/2018   Procedure: ANTERIOR CERVICAL DECOMPRESSION/DISCECTOMY FUSION CERVICAL FIVE- CERVICAL SIX;  Surgeon: Yvonna Herder, MD;  Location: [facility] OR;  Service: Neurosurgery;  Laterality: N/A;   COLONOSCOPY     FOOT SURGERY Right    INCISION AND DRAINAGE OF WOUND  2015   LUMBAR LAMINECTOMY/DECOMPRESSION MICRODISCECTOMY Left 11/25/2018   Procedure: LEFT LUMBAR THREE- LUMBAR FOUR EXTRAFORAMINAL MICRODISCECTOMY;  Surgeon: Yvonna Herder, MD;  Location: [facility] OR;  Service: Neurosurgery;  Laterality: Left;   PLEURAL EFFUSION DRAINAGE  01/01/2013   Procedure: DRAINAGE OF PLEURAL  EFFUSION;  Surgeon: Gardenia Jump, MD;  Location: [facility] OR;  Service: Thoracic;  Laterality: Right;   Patient Active Problem List   Diagnosis Date Noted   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   HNP (herniated nucleus pulposus), cervical 09/02/2018   Hx of colonic polyps 08/12/2017   MS (multiple sclerosis) (HCC) 06/03/2013   Follow-up examination following surgery 02/08/2013   Hemothorax on right 02/08/2013   Pleural effusion on right 12/31/2012   Fracture, ribs 12/31/2012   NICOTINE  ADDICTION 05/24/2010   Dyslipidemia 03/27/2010   MULTIPLE SCLEROSIS, RELAPSING/REMITTING 03/27/2010   Allergic rhinitis 03/27/2010   GERD 03/27/2010    PCP: Aldo Hun, MD  REFERRING PROVIDER: Cristy Doom, MD  REFERRING DIAG: M62.89 Pelvic floor dysfunction  THERAPY DIAG:  Other lack of coordination  Cramp and spasm  Rationale for Evaluation and Treatment: Rehabilitation  ONSET DATE: 2023  SUBJECTIVE:  SUBJECTIVE STATEMENT: I have managed some days better than others. 85% less dribbling after urination. 75% More days of stronger stream. Completion with intercourse is still difficulty.    Fluid intake: all water PAIN:  Are you having pain? No  PRECAUTIONS: None  RED FLAGS: None   WEIGHT BEARING RESTRICTIONS: No  FALLS:  Has patient fallen in last 6 months? No  LIVING ENVIRONMENT: Lives with: lives with their spouse   OCCUPATION: lawyer  PLOF: Independent  PATIENT GOALS: improve with ejaculation, reduce hesitation   PERTINENT HISTORY:  MS; neurogenic bladder; Gastroparesis; Peripheral neuropathy   BOWEL MOVEMENT: none  URINATION: Pain with urination: No Fully empty bladder: Yes: will sit down due to taking a long time for stream to start.  Stream:  takes time for urine to come,  weak stream,  dribbling afterwards.  Urgency: No Frequency: 4 hours Leakage: none Pads: No  INTERCOURSE: Pain with intercourse: none; uses a pump to get an erection but trouble with ejaculation Climax: not during penile penetration all the time Ejaculation: Yes: but needs more hand stimulation   OBJECTIVE:  Note: Objective measures were completed at Evaluation unless otherwise noted.  DIAGNOSTIC FINDINGS:  none    COGNITION: Overall cognitive status:  trouble with memory        POSTURE: No Significant postural limitations  PELVIC ALIGNMENT:  LUMBARAROM/PROM:Lumbar ROM is full    LOWER EXTREMITY AROM/PROM: Bilateral ROM is full   LOWER EXTREMITY MMT: Bilateral hip strength is 5/5   PALPATION:  Patient confirms identification and approves PT to assess internal pelvic floor and treatment No  PELVIC MMT:   MMT eval  Internal Anal Sphincter   External Anal Sphincter   Puborectalis   Diastasis Recti   (Blank rows = not tested)  TONE: Increased Used the RUSI with the ultasound head above the pubic bone on the pelvic floor setting working on pelvic floor contraction and bulging. He is able to bulge the pelvic floor but only move the pelvic floor with contraction 1mm due to the increased tone of the pelvic floor.   TODAY'S TREATMENT:   04/07/24 Neuromuscular re-education: Form correction: ( engage the core and pelvic floor with all exercises) Wall plank with moving hands out and up holding a green band Quadruped lifting opposite arm and leg Supine hip flexion isometric Supine dead bug with extension Wall plank with moving hands holding green band Standing diagonal chop with green band  Self-care: Discussed with patient on the pelvic floor, what muscles work on ejaculation and which ones to strengthen, made patient a diagram.     02/16/24 Neuromuscular re-education: Pelvic floor contraction training: RUSI to the pelvic floor above the pubic bone working  on pelvic floor contraction Patient had difficulty with lifting his pelvic floor due to bearing down and holding his breath. He needed many cues to lift the pelvic floor and needed verbal cues to contract the anus.  Supine contract the pelvic floor with hip isometric would bulge down instead Patient was able to move the pelvic floor 8 mm Exercises: Strengthening: Sitting pelvic floor contraction holding 5 sec 10 x  Sit to stand with pelvic floor contraction with VC to put more weight on the forefoot    02/09/24 Manual: Soft tissue mobilization: Manual work to the bulbocavernosus and ischiocavernosus.  Myofascial release: Fascial release of the urogenital diaphragm going through the layers of the restrictions Neuromuscular re-education: Pelvic floor contraction training: Therapist hand on the urogenital diaphragm working with patient to contract the pelvic  floor, not contract the gluteal and how to isolate the contraction.  Down training: Sitting on different balls to massage the pelvic floor to see if it can relax the pelvic floor Sitting diaphragmatic breathing and feeling the pelvic floor relax into the mat.       PATIENT EDUCATION:  02/09/24 Education details: Access Code: [email or web address], educated patient on how the pelvic floor needs to relax to let the urine come out of the urethra and with ejaculation.  Person educated: Patient Education method: Explanation, Demonstration, Tactile cues, Verbal cues, and Handouts Education comprehension: verbalized understanding, returned demonstration, verbal cues required, tactile cues required, and needs further education  HOME EXERCISE PROGRAM: 02/09/24 Access Code: [email or web address] URL: [email or web address] Date: 02/09/2024 Prepared by: Marsha Skeen  Exercises - Seated Diaphragmatic Breathing  - 2 x daily - 7 x weekly - 1 sets - 10 reps - Seated Swiss Ball Pelvic Circles  - 1 x daily - 7 x weekly - 3 sets - 10 reps - Seated Cat Cow  - 1  x daily - 7 x weekly - 3 sets - 10 reps - Seated Lateral Pelvic Tilt on Swiss Ball  - 1 x daily - 7 x weekly - 3 sets - 10 reps - Quadruped Rocking Slow  - 1 x daily - 3 x weekly - 1 sets - 10 reps - Supine Figure 4 Piriformis Stretch with Leg Extension  - 1 x daily - 3 x weekly - 1 sets - 2 reps - 30sec hold - Seated Pelvic Floor Contraction  - 2 x daily - 7 x weekly - 1 sets - 10 reps - 10 sec hold   ASSESSMENT:  CLINICAL IMPRESSION: Patient is a 58 y.o. male who was seen today for physical therapy  treatment for pelvic floor dysfunction. He is able to sit on the commode and urinate in 1-2 minutes. He is focusing on strengthening his core and pelvic floor to assist with the difficulty of completion of an erection.  He has 75% More days of stronger streamHe  dribbles after urination 85% less. Patient will benefit from skilled therapy to work on pelvic floor relaxation and reduce the delay of urination.    OBJECTIVE IMPAIRMENTS: decreased coordination, decreased strength, and increased fascial restrictions.   ACTIVITY LIMITATIONS: toileting  PARTICIPATION LIMITATIONS: interpersonal relationship  PERSONAL FACTORS: 1 comorbidity: MS; neurogenic bladder; Gastroparesis; Peripheral neuropathy  are also affecting patient's functional outcome.   REHAB POTENTIAL: Good  CLINICAL DECISION MAKING: Evolving/moderate complexity  EVALUATION COMPLEXITY: Moderate   GOALS: Goals reviewed with patient? Yes  SHORT TERM GOALS: Target date: 02/23/24  Patient is able to perform diaphragmatic breathing to lengthen the pelvic floor for improved urine flow.  Baseline: Goal status: Met 02/09/24  2.  Patient is independent with hip flexibility exercises to lengthen the pelvic floor.  Baseline:  Goal status: Met 02/09/24  LONG TERM GOALS: Target date: 07/25/24  Patient independent with advanced HEP.  Baseline:  Goal status: Ongoing 04/07/24  2.  Patient is able to sit on the commode to and urinate within  1-2 minutes.  Baseline:  Goal status: Met 02/16/24  3.  Patient is able to ejaculate in the vaginal canal due to the ability to contract the pelvic floor >/= 5 mm.  Baseline:  Goal status: ongoing 04/07/24  4.  Patient is able to urinate without dribbling 50% of the time due to improved pelvic floor coordination.  Baseline:  Goal status: Met 04/07/24    PLAN:  PT FREQUENCY: 1-2x/week  PT DURATION: 6 months  PLANNED INTERVENTIONS: 97110-Therapeutic exercises, 97530- Therapeutic activity, [id number]- Neuromuscular re-education, 97535- Self Care, 21308- Manual therapy, [id number]- Electrical stimulation (unattended), [phone]- Electrical stimulation (manual), Patient/Family education, Dry Needling, Joint mobilization, Spinal mobilization, Cryotherapy, Moist heat, and Biofeedback  PLAN FOR NEXT SESSION: pelvic floor contraction and strengthening.    Marsha Skeen, PT 04/07/24 1:16 PM

## 2024-04-08 DIAGNOSIS — C8400 Mycosis fungoides, unspecified site: Secondary | ICD-10-CM | POA: Diagnosis not present

## 2024-04-12 DIAGNOSIS — C8400 Mycosis fungoides, unspecified site: Secondary | ICD-10-CM | POA: Diagnosis not present

## 2024-04-13 DIAGNOSIS — H2513 Age-related nuclear cataract, bilateral: Secondary | ICD-10-CM | POA: Diagnosis not present

## 2024-04-14 ENCOUNTER — Ambulatory Visit: Payer: BC Managed Care – PPO | Attending: Urology | Admitting: Physical Therapy

## 2024-04-14 ENCOUNTER — Telehealth: Payer: Self-pay | Admitting: Physical Therapy

## 2024-04-14 DIAGNOSIS — R252 Cramp and spasm: Secondary | ICD-10-CM | POA: Insufficient documentation

## 2024-04-14 DIAGNOSIS — R278 Other lack of coordination: Secondary | ICD-10-CM | POA: Insufficient documentation

## 2024-04-14 NOTE — Telephone Encounter (Signed)
 Called patient about his missed appointment today at 1445. Left  a message.  Marsha Skeen, PT @5 /7/25@ 3:05 PM

## 2024-04-20 DIAGNOSIS — C8400 Mycosis fungoides, unspecified site: Secondary | ICD-10-CM | POA: Diagnosis not present

## 2024-04-21 ENCOUNTER — Encounter: Payer: Self-pay | Admitting: Physical Therapy

## 2024-04-21 ENCOUNTER — Encounter: Payer: BC Managed Care – PPO | Admitting: Physical Therapy

## 2024-04-21 ENCOUNTER — Ambulatory Visit: Admitting: Physical Therapy

## 2024-04-21 DIAGNOSIS — R252 Cramp and spasm: Secondary | ICD-10-CM

## 2024-04-21 DIAGNOSIS — R278 Other lack of coordination: Secondary | ICD-10-CM | POA: Diagnosis not present

## 2024-04-21 NOTE — Therapy (Signed)
 OUTPATIENT PHYSICAL THERAPY MALE PELVIC TREATMENT   Patient Name: [name] MRN: [id number] DOB:August 24, 1966, 58 y.o., male Today's Date: 04/21/2024  END OF SESSION:  PT End of Session - 04/21/24 1534     Visit Number 6    Date for PT Re-Evaluation 07/25/24    Authorization Type BCBS    Authorization Time Period 03/31/2024 - 05/29/2024    Authorization - Visit Number 2    Authorization - Number of Visits 5    PT Start Time 1530    PT Stop Time 1610    PT Time Calculation (min) 40 min    Activity Tolerance Patient tolerated treatment well    Behavior During Therapy [facility] for tasks assessed/performed             Past Medical History:  Diagnosis Date   ALLERGIC RHINITIS 03/27/2010   Fracture, ribs 12/31/2012   Non-displaced fracture of right 8th-11th ribs   Gait disorder    Gastroparesis    GERD 03/27/2010   Hx of adenomatous polyp of colon 08/12/2017   Insomnia    MULTIPLE SCLEROSIS, RELAPSING/REMITTING 03/27/2010   Neurogenic bladder    NICOTINE  ADDICTION 05/24/2010   Peripheral neuropathy    Pleural effusion on right 12/31/2012   Secondary to rib fractures   PONV (postoperative nausea and vomiting)    Rib fracture 11/2012   TMJ (dislocation of temporomandibular joint)    Past Surgical History:  Procedure Laterality Date   ANKLE SURGERY Left    ANTERIOR CERVICAL DECOMP/DISCECTOMY FUSION N/A 09/02/2018   Procedure: ANTERIOR CERVICAL DECOMPRESSION/DISCECTOMY FUSION CERVICAL FIVE- CERVICAL SIX;  Surgeon: Yvonna Herder, MD;  Location: [facility] OR;  Service: Neurosurgery;  Laterality: N/A;   COLONOSCOPY     FOOT SURGERY Right    INCISION AND DRAINAGE OF WOUND  2015   LUMBAR LAMINECTOMY/DECOMPRESSION MICRODISCECTOMY Left 11/25/2018   Procedure: LEFT LUMBAR THREE- LUMBAR FOUR EXTRAFORAMINAL MICRODISCECTOMY;  Surgeon: Yvonna Herder, MD;  Location: [facility] OR;  Service: Neurosurgery;  Laterality: Left;   PLEURAL EFFUSION DRAINAGE  01/01/2013   Procedure: DRAINAGE OF PLEURAL  EFFUSION;  Surgeon: Gardenia Jump, MD;  Location: [facility] OR;  Service: Thoracic;  Laterality: Right;   Patient Active Problem List   Diagnosis Date Noted   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   HNP (herniated nucleus pulposus), cervical 09/02/2018   Hx of colonic polyps 08/12/2017   MS (multiple sclerosis) (HCC) 06/03/2013   Follow-up examination following surgery 02/08/2013   Hemothorax on right 02/08/2013   Pleural effusion on right 12/31/2012   Fracture, ribs 12/31/2012   NICOTINE  ADDICTION 05/24/2010   Dyslipidemia 03/27/2010   MULTIPLE SCLEROSIS, RELAPSING/REMITTING 03/27/2010   Allergic rhinitis 03/27/2010   GERD 03/27/2010    PCP: Aldo Hun, MD  REFERRING PROVIDER: Cristy Doom, MD  REFERRING DIAG: M62.89 Pelvic floor dysfunction  THERAPY DIAG:  Other lack of coordination  Cramp and spasm  Rationale for Evaluation and Treatment: Rehabilitation  ONSET DATE: 2023  SUBJECTIVE:  SUBJECTIVE STATEMENT: Urination part is improved. The erection part is too soon to tell.    Fluid intake: all water PAIN:  Are you having pain? No  PRECAUTIONS: None  RED FLAGS: None   WEIGHT BEARING RESTRICTIONS: No  FALLS:  Has patient fallen in last 6 months? No  LIVING ENVIRONMENT: Lives with: lives with their spouse   OCCUPATION: lawyer  PLOF: Independent  PATIENT GOALS: improve with ejaculation, reduce hesitation   PERTINENT HISTORY:  MS; neurogenic bladder; Gastroparesis; Peripheral neuropathy   BOWEL MOVEMENT: none  URINATION: Pain with urination: No Fully empty bladder: Yes: will sit down due to taking a long time for stream to start.  Stream: takes time for urine to come, weak stream,  dribbling afterwards.  Urgency: No Frequency: 4 hours Leakage: none Pads:  No  INTERCOURSE: Pain with intercourse: none; uses a pump to get an erection but trouble with ejaculation Climax: not during penile penetration all the time Ejaculation: Yes: but needs more hand stimulation   OBJECTIVE:  Note: Objective measures were completed at Evaluation unless otherwise noted.  DIAGNOSTIC FINDINGS:  none    COGNITION: Overall cognitive status: trouble with memory       POSTURE: No Significant postural limitations  PELVIC ALIGNMENT:  LUMBARAROM/PROM:Lumbar ROM is full    LOWER EXTREMITY AROM/PROM: Bilateral ROM is full   LOWER EXTREMITY MMT: Bilateral hip strength is 5/5   PALPATION:  Patient confirms identification and approves PT to assess internal pelvic floor and treatment No  PELVIC MMT:   MMT eval  Internal Anal Sphincter   External Anal Sphincter   Puborectalis   Diastasis Recti   (Blank rows = not tested)  TONE: Increased Used the RUSI with the ultasound head above the pubic bone on the pelvic floor setting working on pelvic floor contraction and bulging. He is able to bulge the pelvic floor but only move the pelvic floor with contraction 1mm due to the increased tone of the pelvic floor.   TODAY'S TREATMENT:   04/21/24 Exercises: Strengthening: Supine dead bug with tactile cues to engage the lower abdomen. Tried with knee extension but cause popping on the left hip and too much arching of the back. 30 x Hip flexion isometric 10 x but too easy.  Hip flexion with green band for resistance and abdominal contraction 30 x Quadruped lift opposite arm and leg with pelvic floor contraction 30 x Wall plank with moving hands holding green band 30x    04/07/24 Neuromuscular re-education: Form correction: ( engage the core and pelvic floor with all exercises) Wall plank with moving hands out and up holding a green band Quadruped lifting opposite arm and leg Supine hip flexion isometric Supine dead bug with extension Wall plank with  moving hands holding green band Standing diagonal chop with green band  Self-care: Discussed with patient on the pelvic floor, what muscles work on ejaculation and which ones to strengthen, made patient a diagram.     02/16/24 Neuromuscular re-education: Pelvic floor contraction training: RUSI to the pelvic floor above the pubic bone working on pelvic floor contraction Patient had difficulty with lifting his pelvic floor due to bearing down and holding his breath. He needed many cues to lift the pelvic floor and needed verbal cues to contract the anus.  Supine contract the pelvic floor with hip isometric would bulge down instead Patient was able to move the pelvic floor 8 mm Exercises: Strengthening: Sitting pelvic floor contraction holding 5 sec 10 x  Sit to stand  with pelvic floor contraction with VC to put more weight on the forefoot      PATIENT EDUCATION:  04/21/24 Education details: Access Code: [email or web address], educated patient on how the pelvic floor needs to relax to let the urine come out of the urethra and with ejaculation.  Person educated: Patient Education method: Explanation, Demonstration, Tactile cues, Verbal cues, and Handouts Education comprehension: verbalized understanding, returned demonstration, verbal cues required, tactile cues required, and needs further education  HOME EXERCISE PROGRAM: 04/21/24 Access Code: [email or web address] URL: [email or web address] Date: 04/21/2024 Prepared by: Marsha Skeen  Exercises - Seated Cat Cow  - 1 x daily - 7 x weekly - 3 sets - 10 reps - Quadruped Rocking Slow  - 1 x daily - 3 x weekly - 1 sets - 10 reps - Supine Figure 4 Piriformis Stretch with Leg Extension  - 1 x daily - 3 x weekly - 1 sets - 2 reps - 30sec hold - Seated Pelvic Floor Contraction  - 2 x daily - 7 x weekly - 1 sets - 10 reps - 5 sec hold - Sit to Stand with Pelvic Floor Contraction  - 1 x daily - 7 x weekly - 1 sets - 10 reps - Quadruped Pelvic Floor  Contraction with Opposite Arm and Leg Lift  - 1 x daily - 2 x weekly - 2 sets - 10 reps - Standing Plank on Wall with Reaches and Resistance  - 1 x daily - 3 x weekly - 1 sets - 10 reps - Standing Diagonal Chop  - 1 x daily - 2 x weekly - 2 sets - 10 reps - Dead Bug  - 1 x daily - 7 x weekly - 3 sets - 10 reps - Supine March with Resistance Band  - 1 x daily - 7 x weekly - 3 sets - 10 reps   ASSESSMENT:  CLINICAL IMPRESSION: Patient is a 58 y.o. male who was seen today for physical therapy  treatment for pelvic floor dysfunction. He is able to sit on the commode and urinate in 1-2 minutes. He is still focusing on strengthening his core and pelvic floor to assist with the difficulty of completion of an erection. He still needs cues to contract the pelvic floor with core during his exercises and verbal cues on technique.  He has 75% More days of stronger stream. He  dribbles after urination 85% less. Patient will benefit from skilled therapy to work on pelvic floor relaxation and reduce the delay of urination.    OBJECTIVE IMPAIRMENTS: decreased coordination, decreased strength, and increased fascial restrictions.   ACTIVITY LIMITATIONS: toileting  PARTICIPATION LIMITATIONS: interpersonal relationship  PERSONAL FACTORS: 1 comorbidity: MS; neurogenic bladder; Gastroparesis; Peripheral neuropathy are also affecting patient's functional outcome.   REHAB POTENTIAL: Good  CLINICAL DECISION MAKING: Evolving/moderate complexity  EVALUATION COMPLEXITY: Moderate   GOALS: Goals reviewed with patient? Yes  SHORT TERM GOALS: Target date: 02/23/24  Patient is able to perform diaphragmatic breathing to lengthen the pelvic floor for improved urine flow.  Baseline: Goal status: Met 02/09/24  2.  Patient is independent with hip flexibility exercises to lengthen the pelvic floor.  Baseline:  Goal status: Met 02/09/24  LONG TERM GOALS: Target date: 07/25/24  Patient independent with advanced HEP.   Baseline:  Goal status: Ongoing 04/07/24  2.  Patient is able to sit on the commode to and urinate within 1-2 minutes.  Baseline:  Goal status: Met 02/16/24  3.  Patient is able to ejaculate in the  vaginal canal due to the ability to contract the pelvic floor >/= 5 mm.  Baseline:  Goal status: ongoing 04/07/24  4.  Patient is able to urinate without dribbling 50% of the time due to improved pelvic floor coordination.  Baseline:  Goal status: Met 04/07/24    PLAN:  PT FREQUENCY: 1-2x/week  PT DURATION: 6 months  PLANNED INTERVENTIONS: 97110-Therapeutic exercises, 97530- Therapeutic activity, 97112- Neuromuscular re-education, 97535- Self Care, 40981- Manual therapy, G0283- Electrical stimulation (unattended), [phone]- Electrical stimulation (manual), Patient/Family education, Dry Needling, Joint mobilization, Spinal mobilization, Cryotherapy, Moist heat, and Biofeedback  PLAN FOR NEXT SESSION: pelvic floor contraction and strengthening. RUSI for pelvic floor   Marsha Skeen, PT 04/21/24 4:18 PM

## 2024-04-22 DIAGNOSIS — C8400 Mycosis fungoides, unspecified site: Secondary | ICD-10-CM | POA: Diagnosis not present

## 2024-04-26 ENCOUNTER — Ambulatory Visit: Payer: Self-pay | Admitting: Physical Therapy

## 2024-04-26 ENCOUNTER — Encounter: Payer: Self-pay | Admitting: Physical Therapy

## 2024-04-26 DIAGNOSIS — R252 Cramp and spasm: Secondary | ICD-10-CM

## 2024-04-26 DIAGNOSIS — R278 Other lack of coordination: Secondary | ICD-10-CM | POA: Diagnosis not present

## 2024-04-26 NOTE — Therapy (Signed)
 OUTPATIENT PHYSICAL THERAPY MALE PELVIC TREATMENT   Patient Name: [name] MRN: [id number] DOB:Sep 02, 1966, 58 y.o., male Today's Date: 04/26/2024  END OF SESSION:  PT End of Session - 04/26/24 1236     Visit Number 7    Date for PT Re-Evaluation 07/25/24    Authorization Type BCBS    Authorization Time Period 03/31/2024 - 05/29/2024    Authorization - Visit Number 3    Authorization - Number of Visits 5    PT Start Time 1230    PT Stop Time 1310    PT Time Calculation (min) 40 min    Activity Tolerance Patient tolerated treatment well    Behavior During Therapy [facility] for tasks assessed/performed             Past Medical History:  Diagnosis Date   ALLERGIC RHINITIS 03/27/2010   Fracture, ribs 12/31/2012   Non-displaced fracture of right 8th-11th ribs   Gait disorder    Gastroparesis    GERD 03/27/2010   Hx of adenomatous polyp of colon 08/12/2017   Insomnia    MULTIPLE SCLEROSIS, RELAPSING/REMITTING 03/27/2010   Neurogenic bladder    NICOTINE  ADDICTION 05/24/2010   Peripheral neuropathy    Pleural effusion on right 12/31/2012   Secondary to rib fractures   PONV (postoperative nausea and vomiting)    Rib fracture 11/2012   TMJ (dislocation of temporomandibular joint)    Past Surgical History:  Procedure Laterality Date   ANKLE SURGERY Left    ANTERIOR CERVICAL DECOMP/DISCECTOMY FUSION N/A 09/02/2018   Procedure: ANTERIOR CERVICAL DECOMPRESSION/DISCECTOMY FUSION CERVICAL FIVE- CERVICAL SIX;  Surgeon: Yvonna Herder, MD;  Location: [facility] OR;  Service: Neurosurgery;  Laterality: N/A;   COLONOSCOPY     FOOT SURGERY Right    INCISION AND DRAINAGE OF WOUND  2015   LUMBAR LAMINECTOMY/DECOMPRESSION MICRODISCECTOMY Left 11/25/2018   Procedure: LEFT LUMBAR THREE- LUMBAR FOUR EXTRAFORAMINAL MICRODISCECTOMY;  Surgeon: Yvonna Herder, MD;  Location: [facility] OR;  Service: Neurosurgery;  Laterality: Left;   PLEURAL EFFUSION DRAINAGE  01/01/2013   Procedure: DRAINAGE OF PLEURAL  EFFUSION;  Surgeon: Gardenia Jump, MD;  Location: [facility] OR;  Service: Thoracic;  Laterality: Right;   Patient Active Problem List   Diagnosis Date Noted   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   HNP (herniated nucleus pulposus), cervical 09/02/2018   Hx of colonic polyps 08/12/2017   MS (multiple sclerosis) (HCC) 06/03/2013   Follow-up examination following surgery 02/08/2013   Hemothorax on right 02/08/2013   Pleural effusion on right 12/31/2012   Fracture, ribs 12/31/2012   NICOTINE  ADDICTION 05/24/2010   Dyslipidemia 03/27/2010   MULTIPLE SCLEROSIS, RELAPSING/REMITTING 03/27/2010   Allergic rhinitis 03/27/2010   GERD 03/27/2010    PCP: Aldo Hun, MD  REFERRING PROVIDER: Cristy Doom, MD  REFERRING DIAG: M62.89 Pelvic floor dysfunction  THERAPY DIAG:  Other lack of coordination  Cramp and spasm  Rationale for Evaluation and Treatment: Rehabilitation  ONSET DATE: 2023  SUBJECTIVE:  SUBJECTIVE STATEMENT: Initiating the urine stream still is difficult at times. Ejaculation is still difficult at times. The exercises are easy.  Fluid intake: all water PAIN:  Are you having pain? No  PRECAUTIONS: None  RED FLAGS: None   WEIGHT BEARING RESTRICTIONS: No  FALLS:  Has patient fallen in last 6 months? No  LIVING ENVIRONMENT: Lives with: lives with their spouse   OCCUPATION: lawyer  PLOF: Independent  PATIENT GOALS: improve with ejaculation, reduce hesitation   PERTINENT HISTORY:  MS; neurogenic bladder; Gastroparesis; Peripheral neuropathy   BOWEL MOVEMENT: none  URINATION: Pain with urination: No Fully empty bladder: Yes: will sit down due to taking a long time for stream to start.  Stream: takes time for urine to come, weak stream,  dribbling afterwards.   Urgency: No Frequency: 4 hours Leakage: none Pads: No  INTERCOURSE: Pain with intercourse: none; uses a pump to get an erection but trouble with ejaculation Climax: not during penile penetration all the time Ejaculation: Yes: but needs more hand stimulation   OBJECTIVE:  Note: Objective measures were completed at Evaluation unless otherwise noted.  DIAGNOSTIC FINDINGS:  none    COGNITION: Overall cognitive status: trouble with memory       POSTURE: No Significant postural limitations  PELVIC ALIGNMENT:  LUMBARAROM/PROM:Lumbar ROM is full    LOWER EXTREMITY AROM/PROM: Bilateral ROM is full   LOWER EXTREMITY MMT: Bilateral hip strength is 5/5   PALPATION:  Patient confirms identification and approves PT to assess internal pelvic floor and treatment No  PELVIC MMT:   MMT eval  Internal Anal Sphincter   External Anal Sphincter   Puborectalis   Diastasis Recti   (Blank rows = not tested)  TONE: Increased Used the RUSI with the ultasound head above the pubic bone on the pelvic floor setting working on pelvic floor contraction and bulging. He is able to bulge the pelvic floor but only move the pelvic floor with contraction 1mm due to the increased tone of the pelvic floor.   TODAY'S TREATMENT:   04/26/24 Neuromuscular re-education: Pelvic floor contraction training: Using the RUSI on the pelvic floor program with ultrasound head just above the pubic bone to work on pelvic floor contraction. Initially he was able to move the pelvic floor 8 mm then giving the patient other cues her would bulge the pelvic floor. He was able to move the pelvic floor in sitting better than laying down due to tactile cues from the chair. He is able to breath and bulge the pelvic floor moving it 5 mm.     04/21/24 Exercises: Strengthening: Supine dead bug with tactile cues to engage the lower abdomen. Tried with knee extension but cause popping on the left hip and too much arching  of the back. 30 x Hip flexion isometric 10 x but too easy.  Hip flexion with green band for resistance and abdominal contraction 30 x Quadruped lift opposite arm and leg with pelvic floor contraction 30 x Wall plank with moving hands holding green band 30x    04/07/24 Neuromuscular re-education: Form correction: ( engage the core and pelvic floor with all exercises) Wall plank with moving hands out and up holding a green band Quadruped lifting opposite arm and leg Supine hip flexion isometric Supine dead bug with extension Wall plank with moving hands holding green band Standing diagonal chop with green band  Self-care: Discussed with patient on the pelvic floor, what muscles work on ejaculation and which ones to strengthen, made patient a diagram.  02/16/24 Neuromuscular re-education: Pelvic floor contraction training: RUSI to the pelvic floor above the pubic bone working on pelvic floor contraction Patient had difficulty with lifting his pelvic floor due to bearing down and holding his breath. He needed many cues to lift the pelvic floor and needed verbal cues to contract the anus.  Supine contract the pelvic floor with hip isometric would bulge down instead Patient was able to move the pelvic floor 8 mm Exercises: Strengthening: Sitting pelvic floor contraction holding 5 sec 10 x  Sit to stand with pelvic floor contraction with VC to put more weight on the forefoot      PATIENT EDUCATION:  04/26/24 Education details: Access Code: [email or web address], educated patient on how the pelvic floor needs to relax to let the urine come out of the urethra and with ejaculation.  Person educated: Patient Education method: Explanation, Demonstration, Tactile cues, Verbal cues, and Handouts Education comprehension: verbalized understanding, returned demonstration, verbal cues required, tactile cues required, and needs further education  HOME EXERCISE PROGRAM: 04/26/24 Access Code: [email or web address] URL:  [email or web address] Date: 04/21/2024 Prepared by: Marsha Skeen  Exercises - Seated Cat Cow  - 1 x daily - 7 x weekly - 3 sets - 10 reps - Quadruped Rocking Slow  - 1 x daily - 3 x weekly - 1 sets - 10 reps - Supine Figure 4 Piriformis Stretch with Leg Extension  - 1 x daily - 3 x weekly - 1 sets - 2 reps - 30sec hold - Seated Pelvic Floor Contraction  - 2 x daily - 7 x weekly - 1 sets - 10 reps - 5 sec hold - Sit to Stand with Pelvic Floor Contraction  - 1 x daily - 7 x weekly - 1 sets - 10 reps - Quadruped Pelvic Floor Contraction with Opposite Arm and Leg Lift  - 1 x daily - 2 x weekly - 2 sets - 10 reps - Standing Plank on Wall with Reaches and Resistance  - 1 x daily - 3 x weekly - 1 sets - 10 reps - Standing Diagonal Chop  - 1 x daily - 2 x weekly - 2 sets - 10 reps - Dead Bug  - 1 x daily - 7 x weekly - 3 sets - 10 reps - Supine March with Resistance Band  - 1 x daily - 7 x weekly - 3 sets - 10 reps   ASSESSMENT:  CLINICAL IMPRESSION: Patient is a 58 y.o. male who was seen today for physical therapy  treatment for pelvic floor dysfunction. He is able to sit on the commode and urinate in 1-2 minutes. He is still focusing on strengthening his core and pelvic floor to assist with the difficulty of completion of an erection.  He has 75% more days of stronger stream. He  dribbles after urination 85% less. He is independent with his home exercise program. Patient will benefit from skilled therapy to work on pelvic floor relaxation and reduce the delay of urination.    OBJECTIVE IMPAIRMENTS: decreased coordination, decreased strength, and increased fascial restrictions.   ACTIVITY LIMITATIONS: toileting  PARTICIPATION LIMITATIONS: interpersonal relationship  PERSONAL FACTORS: 1 comorbidity: MS; neurogenic bladder; Gastroparesis; Peripheral neuropathy are also affecting patient's functional outcome.   REHAB POTENTIAL: Good  CLINICAL DECISION MAKING: Evolving/moderate  complexity  EVALUATION COMPLEXITY: Moderate   GOALS: Goals reviewed with patient? Yes  SHORT TERM GOALS: Target date: 02/23/24  Patient is able to perform diaphragmatic breathing to lengthen the pelvic floor for improved urine flow.  Baseline: Goal status: Met 02/09/24  2.  Patient is independent with hip flexibility exercises to lengthen the pelvic floor.  Baseline:  Goal status: Met 02/09/24  LONG TERM GOALS: Target date: 07/25/24  Patient independent with advanced HEP.  Baseline:  Goal status: met 04/26/24  2.  Patient is able to sit on the commode to and urinate within 1-2 minutes.  Baseline:  Goal status: Met 02/16/24  3.  Patient is able to ejaculate in the vaginal canal due to the ability to contract the pelvic floor >/= 5 mm.  Baseline:  Goal status: not met 04/26/24  4.  Patient is able to urinate without dribbling 50% of the time due to improved pelvic floor coordination.  Baseline:  Goal status: Met 04/07/24    PLAN:  PT FREQUENCY: 1-2x/week  PT DURATION: 6 months  PLANNED INTERVENTIONS: 97110-Therapeutic exercises, 97530- Therapeutic activity, 97112- Neuromuscular re-education, 97535- Self Care, 16109- Manual therapy, G0283- Electrical stimulation (unattended), [phone]- Electrical stimulation (manual), Patient/Family education, Dry Needling, Joint mobilization, Spinal mobilization, Cryotherapy, Moist heat, and Biofeedback  PLAN FOR NEXT SESSION: pelvic floor contraction and strengthening. RUSI for pelvic floor   Marsha Skeen, PT 04/26/24 12:37 PM  PHYSICAL THERAPY DISCHARGE SUMMARY  Visits from Start of Care: 7  Current functional level related to goals / functional outcomes: See above.    Remaining deficits: See above.    Education / Equipment: HEP   Patient agrees to discharge. Patient goals were partially met. Patient is being discharged due to maximized rehab potential. Thank you for the referral.   Marsha Skeen, PT 04/26/24 2:38 PM

## 2024-04-27 DIAGNOSIS — G35 Multiple sclerosis: Secondary | ICD-10-CM | POA: Diagnosis not present

## 2024-04-28 DIAGNOSIS — M79671 Pain in right foot: Secondary | ICD-10-CM | POA: Diagnosis not present

## 2024-04-28 DIAGNOSIS — M961 Postlaminectomy syndrome, not elsewhere classified: Secondary | ICD-10-CM | POA: Diagnosis not present

## 2024-04-28 DIAGNOSIS — G609 Hereditary and idiopathic neuropathy, unspecified: Secondary | ICD-10-CM | POA: Diagnosis not present

## 2024-04-28 DIAGNOSIS — G35 Multiple sclerosis: Secondary | ICD-10-CM | POA: Diagnosis not present

## 2024-04-29 DIAGNOSIS — C8400 Mycosis fungoides, unspecified site: Secondary | ICD-10-CM | POA: Diagnosis not present

## 2024-05-06 DIAGNOSIS — C8400 Mycosis fungoides, unspecified site: Secondary | ICD-10-CM | POA: Diagnosis not present

## 2024-05-11 DIAGNOSIS — C8400 Mycosis fungoides, unspecified site: Secondary | ICD-10-CM | POA: Diagnosis not present

## 2024-05-13 DIAGNOSIS — C8400 Mycosis fungoides, unspecified site: Secondary | ICD-10-CM | POA: Diagnosis not present

## 2024-05-18 DIAGNOSIS — C8400 Mycosis fungoides, unspecified site: Secondary | ICD-10-CM | POA: Diagnosis not present

## 2024-05-20 DIAGNOSIS — C8400 Mycosis fungoides, unspecified site: Secondary | ICD-10-CM | POA: Diagnosis not present

## 2024-05-25 DIAGNOSIS — C8400 Mycosis fungoides, unspecified site: Secondary | ICD-10-CM | POA: Diagnosis not present

## 2024-05-27 DIAGNOSIS — C8400 Mycosis fungoides, unspecified site: Secondary | ICD-10-CM | POA: Diagnosis not present

## 2024-06-01 DIAGNOSIS — C8400 Mycosis fungoides, unspecified site: Secondary | ICD-10-CM | POA: Diagnosis not present

## 2024-06-01 DIAGNOSIS — Z125 Encounter for screening for malignant neoplasm of prostate: Secondary | ICD-10-CM | POA: Diagnosis not present

## 2024-06-01 DIAGNOSIS — E785 Hyperlipidemia, unspecified: Secondary | ICD-10-CM | POA: Diagnosis not present

## 2024-06-01 DIAGNOSIS — E673 Hypervitaminosis D: Secondary | ICD-10-CM | POA: Diagnosis not present

## 2024-06-03 DIAGNOSIS — R82998 Other abnormal findings in urine: Secondary | ICD-10-CM | POA: Diagnosis not present

## 2024-06-03 DIAGNOSIS — Z1212 Encounter for screening for malignant neoplasm of rectum: Secondary | ICD-10-CM | POA: Diagnosis not present

## 2024-06-03 DIAGNOSIS — C8400 Mycosis fungoides, unspecified site: Secondary | ICD-10-CM | POA: Diagnosis not present

## 2024-06-07 DIAGNOSIS — C8400 Mycosis fungoides, unspecified site: Secondary | ICD-10-CM | POA: Diagnosis not present

## 2024-06-08 DIAGNOSIS — Z0000 Encounter for general adult medical examination without abnormal findings: Secondary | ICD-10-CM | POA: Diagnosis not present

## 2024-06-08 DIAGNOSIS — J309 Allergic rhinitis, unspecified: Secondary | ICD-10-CM | POA: Diagnosis not present

## 2024-06-09 DIAGNOSIS — R82998 Other abnormal findings in urine: Secondary | ICD-10-CM | POA: Diagnosis not present

## 2024-06-09 DIAGNOSIS — C8400 Mycosis fungoides, unspecified site: Secondary | ICD-10-CM | POA: Diagnosis not present

## 2024-06-16 DIAGNOSIS — M79671 Pain in right foot: Secondary | ICD-10-CM | POA: Diagnosis not present

## 2024-06-16 DIAGNOSIS — M961 Postlaminectomy syndrome, not elsewhere classified: Secondary | ICD-10-CM | POA: Diagnosis not present

## 2024-06-16 DIAGNOSIS — G35 Multiple sclerosis: Secondary | ICD-10-CM | POA: Diagnosis not present

## 2024-06-16 DIAGNOSIS — G609 Hereditary and idiopathic neuropathy, unspecified: Secondary | ICD-10-CM | POA: Diagnosis not present

## 2024-06-17 DIAGNOSIS — C8400 Mycosis fungoides, unspecified site: Secondary | ICD-10-CM | POA: Diagnosis not present

## 2024-06-22 DIAGNOSIS — N3000 Acute cystitis without hematuria: Secondary | ICD-10-CM | POA: Diagnosis not present

## 2024-06-22 DIAGNOSIS — N401 Enlarged prostate with lower urinary tract symptoms: Secondary | ICD-10-CM | POA: Diagnosis not present

## 2024-06-22 DIAGNOSIS — R3916 Straining to void: Secondary | ICD-10-CM | POA: Diagnosis not present

## 2024-06-22 DIAGNOSIS — N528 Other male erectile dysfunction: Secondary | ICD-10-CM | POA: Diagnosis not present

## 2024-06-24 DIAGNOSIS — C8400 Mycosis fungoides, unspecified site: Secondary | ICD-10-CM | POA: Diagnosis not present

## 2024-06-29 DIAGNOSIS — C8400 Mycosis fungoides, unspecified site: Secondary | ICD-10-CM | POA: Diagnosis not present

## 2024-07-01 DIAGNOSIS — C8400 Mycosis fungoides, unspecified site: Secondary | ICD-10-CM | POA: Diagnosis not present

## 2024-07-03 ENCOUNTER — Other Ambulatory Visit (HOSPITAL_COMMUNITY): Payer: Self-pay

## 2024-07-06 DIAGNOSIS — C8400 Mycosis fungoides, unspecified site: Secondary | ICD-10-CM | POA: Diagnosis not present

## 2024-07-12 ENCOUNTER — Other Ambulatory Visit (HOSPITAL_COMMUNITY): Payer: Self-pay

## 2024-07-13 DIAGNOSIS — C8400 Mycosis fungoides, unspecified site: Secondary | ICD-10-CM | POA: Diagnosis not present

## 2024-07-14 DIAGNOSIS — R3916 Straining to void: Secondary | ICD-10-CM | POA: Diagnosis not present

## 2024-07-14 DIAGNOSIS — N3000 Acute cystitis without hematuria: Secondary | ICD-10-CM | POA: Diagnosis not present

## 2024-07-14 DIAGNOSIS — N401 Enlarged prostate with lower urinary tract symptoms: Secondary | ICD-10-CM | POA: Diagnosis not present

## 2024-07-20 DIAGNOSIS — C8400 Mycosis fungoides, unspecified site: Secondary | ICD-10-CM | POA: Diagnosis not present

## 2024-07-21 DIAGNOSIS — T83490D Other mechanical complication of penile (implanted) prosthesis, subsequent encounter: Secondary | ICD-10-CM | POA: Diagnosis not present

## 2024-07-21 DIAGNOSIS — N401 Enlarged prostate with lower urinary tract symptoms: Secondary | ICD-10-CM | POA: Diagnosis not present

## 2024-07-21 DIAGNOSIS — F5232 Male orgasmic disorder: Secondary | ICD-10-CM | POA: Diagnosis not present

## 2024-07-21 DIAGNOSIS — N319 Neuromuscular dysfunction of bladder, unspecified: Secondary | ICD-10-CM | POA: Diagnosis not present

## 2024-07-27 DIAGNOSIS — C8400 Mycosis fungoides, unspecified site: Secondary | ICD-10-CM | POA: Diagnosis not present

## 2024-08-03 DIAGNOSIS — C8400 Mycosis fungoides, unspecified site: Secondary | ICD-10-CM | POA: Diagnosis not present

## 2024-08-27 DIAGNOSIS — R339 Retention of urine, unspecified: Secondary | ICD-10-CM | POA: Diagnosis not present

## 2024-08-27 DIAGNOSIS — N401 Enlarged prostate with lower urinary tract symptoms: Secondary | ICD-10-CM | POA: Diagnosis not present

## 2024-08-27 DIAGNOSIS — F418 Other specified anxiety disorders: Secondary | ICD-10-CM | POA: Diagnosis not present

## 2024-08-27 DIAGNOSIS — R3916 Straining to void: Secondary | ICD-10-CM | POA: Diagnosis not present

## 2024-08-27 DIAGNOSIS — N3943 Post-void dribbling: Secondary | ICD-10-CM | POA: Diagnosis not present

## 2024-08-27 DIAGNOSIS — Z01818 Encounter for other preprocedural examination: Secondary | ICD-10-CM | POA: Diagnosis not present

## 2024-08-27 DIAGNOSIS — E7849 Other hyperlipidemia: Secondary | ICD-10-CM | POA: Diagnosis not present

## 2024-08-27 DIAGNOSIS — G4700 Insomnia, unspecified: Secondary | ICD-10-CM | POA: Diagnosis not present

## 2024-08-27 DIAGNOSIS — Z86711 Personal history of pulmonary embolism: Secondary | ICD-10-CM | POA: Diagnosis not present

## 2024-08-31 DIAGNOSIS — C8400 Mycosis fungoides, unspecified site: Secondary | ICD-10-CM | POA: Diagnosis not present

## 2024-09-01 DIAGNOSIS — G35 Multiple sclerosis: Secondary | ICD-10-CM | POA: Diagnosis not present

## 2024-09-01 DIAGNOSIS — Z9889 Other specified postprocedural states: Secondary | ICD-10-CM | POA: Diagnosis not present

## 2024-09-01 DIAGNOSIS — M5416 Radiculopathy, lumbar region: Secondary | ICD-10-CM | POA: Diagnosis not present

## 2024-09-01 DIAGNOSIS — Z981 Arthrodesis status: Secondary | ICD-10-CM | POA: Diagnosis not present

## 2024-09-08 DIAGNOSIS — K219 Gastro-esophageal reflux disease without esophagitis: Secondary | ICD-10-CM | POA: Diagnosis not present

## 2024-09-08 DIAGNOSIS — Z79899 Other long term (current) drug therapy: Secondary | ICD-10-CM | POA: Diagnosis not present

## 2024-09-08 DIAGNOSIS — E785 Hyperlipidemia, unspecified: Secondary | ICD-10-CM | POA: Diagnosis not present

## 2024-09-08 DIAGNOSIS — N400 Enlarged prostate without lower urinary tract symptoms: Secondary | ICD-10-CM | POA: Diagnosis not present

## 2024-09-08 DIAGNOSIS — Z86711 Personal history of pulmonary embolism: Secondary | ICD-10-CM | POA: Diagnosis not present

## 2024-09-14 DIAGNOSIS — C8400 Mycosis fungoides, unspecified site: Secondary | ICD-10-CM | POA: Diagnosis not present

## 2024-09-20 DIAGNOSIS — L738 Other specified follicular disorders: Secondary | ICD-10-CM | POA: Diagnosis not present

## 2024-09-29 DIAGNOSIS — J300 Vasomotor rhinitis: Secondary | ICD-10-CM | POA: Diagnosis not present

## 2024-09-30 DIAGNOSIS — M5416 Radiculopathy, lumbar region: Secondary | ICD-10-CM | POA: Diagnosis not present

## 2024-09-30 DIAGNOSIS — Z981 Arthrodesis status: Secondary | ICD-10-CM | POA: Diagnosis not present

## 2024-09-30 DIAGNOSIS — M546 Pain in thoracic spine: Secondary | ICD-10-CM | POA: Diagnosis not present

## 2024-10-05 ENCOUNTER — Other Ambulatory Visit (HOSPITAL_COMMUNITY): Payer: Self-pay

## 2024-10-05 DIAGNOSIS — C8400 Mycosis fungoides, unspecified site: Secondary | ICD-10-CM | POA: Diagnosis not present

## 2024-10-05 MED ORDER — FLUZONE 0.5 ML IM SUSY
0.5000 mL | PREFILLED_SYRINGE | Freq: Once | INTRAMUSCULAR | 0 refills | Status: AC
  Filled 2024-10-05: qty 0.5, 1d supply, fill #0

## 2024-10-05 MED ORDER — COVID-19 MRNA VAC-TRIS(PFIZER) 30 MCG/0.3ML IM SUSY
PREFILLED_SYRINGE | INTRAMUSCULAR | 0 refills | Status: AC
  Filled 2024-10-05: qty 0.3, 1d supply, fill #0

## 2024-10-11 DIAGNOSIS — C8400 Mycosis fungoides, unspecified site: Secondary | ICD-10-CM | POA: Diagnosis not present

## 2024-10-12 DIAGNOSIS — M542 Cervicalgia: Secondary | ICD-10-CM | POA: Diagnosis not present

## 2024-10-12 DIAGNOSIS — H2513 Age-related nuclear cataract, bilateral: Secondary | ICD-10-CM | POA: Diagnosis not present

## 2024-10-12 DIAGNOSIS — M4802 Spinal stenosis, cervical region: Secondary | ICD-10-CM | POA: Diagnosis not present

## 2024-10-19 DIAGNOSIS — C8400 Mycosis fungoides, unspecified site: Secondary | ICD-10-CM | POA: Diagnosis not present

## 2024-10-26 DIAGNOSIS — C8400 Mycosis fungoides, unspecified site: Secondary | ICD-10-CM | POA: Diagnosis not present

## 2024-11-02 DIAGNOSIS — R4189 Other symptoms and signs involving cognitive functions and awareness: Secondary | ICD-10-CM | POA: Diagnosis not present

## 2024-11-02 DIAGNOSIS — Z79899 Other long term (current) drug therapy: Secondary | ICD-10-CM | POA: Diagnosis not present

## 2024-11-02 DIAGNOSIS — G35D Multiple sclerosis, unspecified: Secondary | ICD-10-CM | POA: Diagnosis not present

## 2024-11-02 DIAGNOSIS — G500 Trigeminal neuralgia: Secondary | ICD-10-CM | POA: Diagnosis not present

## 2024-11-10 DIAGNOSIS — Z9889 Other specified postprocedural states: Secondary | ICD-10-CM | POA: Diagnosis not present

## 2024-11-10 DIAGNOSIS — Z6825 Body mass index (BMI) 25.0-25.9, adult: Secondary | ICD-10-CM | POA: Diagnosis not present

## 2024-11-10 DIAGNOSIS — M5416 Radiculopathy, lumbar region: Secondary | ICD-10-CM | POA: Diagnosis not present

## 2024-11-10 DIAGNOSIS — C8400 Mycosis fungoides, unspecified site: Secondary | ICD-10-CM | POA: Diagnosis not present

## 2024-12-20 ENCOUNTER — Other Ambulatory Visit: Payer: Self-pay | Admitting: Internal Medicine

## 2024-12-20 DIAGNOSIS — R9389 Abnormal findings on diagnostic imaging of other specified body structures: Secondary | ICD-10-CM

## 2024-12-20 DIAGNOSIS — R918 Other nonspecific abnormal finding of lung field: Secondary | ICD-10-CM

## 2024-12-21 ENCOUNTER — Other Ambulatory Visit

## 2025-01-17 ENCOUNTER — Other Ambulatory Visit
